# Patient Record
Sex: Female | Born: 1955
Health system: Southern US, Community
[De-identification: ages and names within clinical notes are randomized; demographics above are authoritative.]

## PROBLEM LIST (undated history)

## (undated) DIAGNOSIS — K219 Gastro-esophageal reflux disease without esophagitis: Secondary | ICD-10-CM

## (undated) DIAGNOSIS — C801 Malignant (primary) neoplasm, unspecified: Secondary | ICD-10-CM

## (undated) DIAGNOSIS — Z973 Presence of spectacles and contact lenses: Secondary | ICD-10-CM

## (undated) DIAGNOSIS — M549 Dorsalgia, unspecified: Secondary | ICD-10-CM

## (undated) DIAGNOSIS — G4730 Sleep apnea, unspecified: Secondary | ICD-10-CM

## (undated) DIAGNOSIS — G8929 Other chronic pain: Secondary | ICD-10-CM

## (undated) DIAGNOSIS — M1990 Unspecified osteoarthritis, unspecified site: Secondary | ICD-10-CM

## (undated) HISTORY — PX: ANKLE SURGERY: SHX546

## (undated) HISTORY — PX: WRIST GANGLION EXCISION: SUR520

## (undated) HISTORY — DX: Unspecified osteoarthritis, unspecified site: M19.90

## (undated) HISTORY — PX: MOHS SURGERY: SUR867

---
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
## 1997-12-24 HISTORY — PX: TUBAL LIGATION: SHX77

## 1998-09-22 ENCOUNTER — Inpatient Hospital Stay (HOSPITAL_COMMUNITY): Admission: AD | Admit: 1998-09-22 | Discharge: 1998-09-26 | Payer: Self-pay | Admitting: Obstetrics and Gynecology

## 2000-02-13 ENCOUNTER — Other Ambulatory Visit: Admission: RE | Admit: 2000-02-13 | Discharge: 2000-02-13 | Payer: Self-pay | Admitting: Obstetrics and Gynecology

## 2001-09-08 ENCOUNTER — Other Ambulatory Visit: Admission: RE | Admit: 2001-09-08 | Discharge: 2001-09-08 | Payer: Self-pay | Admitting: Obstetrics and Gynecology

## 2002-12-08 ENCOUNTER — Other Ambulatory Visit: Admission: RE | Admit: 2002-12-08 | Discharge: 2002-12-08 | Payer: Self-pay | Admitting: Obstetrics and Gynecology

## 2004-10-23 ENCOUNTER — Other Ambulatory Visit: Admission: RE | Admit: 2004-10-23 | Discharge: 2004-10-23 | Payer: Self-pay | Admitting: Obstetrics and Gynecology

## 2009-07-19 ENCOUNTER — Ambulatory Visit: Payer: Self-pay | Admitting: Family Medicine

## 2009-07-19 DIAGNOSIS — M1990 Unspecified osteoarthritis, unspecified site: Secondary | ICD-10-CM | POA: Insufficient documentation

## 2009-07-21 LAB — CONVERTED CEMR LAB
ALT: 19 units/L (ref 0–35)
AST: 21 units/L (ref 0–37)
Albumin: 4.1 g/dL (ref 3.5–5.2)
Alkaline Phosphatase: 71 units/L (ref 39–117)
BUN: 14 mg/dL (ref 6–23)
Basophils Absolute: 0 10*3/uL (ref 0.0–0.1)
Basophils Relative: 0.4 % (ref 0.0–3.0)
Bilirubin, Direct: 0 mg/dL (ref 0.0–0.3)
CO2: 31 meq/L (ref 19–32)
Calcium: 9.3 mg/dL (ref 8.4–10.5)
Chloride: 105 meq/L (ref 96–112)
Cholesterol: 193 mg/dL (ref 0–200)
Creatinine, Ser: 0.6 mg/dL (ref 0.4–1.2)
Eosinophils Absolute: 0.1 10*3/uL (ref 0.0–0.7)
Eosinophils Relative: 1.8 % (ref 0.0–5.0)
GFR calc non Af Amer: 111.18 mL/min (ref >60)
Glucose, Bld: 84 mg/dL (ref 70–99)
HCT: 39.3 % (ref 36.0–46.0)
HDL: 50.1 mg/dL (ref >39.00)
Hemoglobin: 13.5 g/dL (ref 12.0–15.0)
LDL Cholesterol: 120 mg/dL — ABNORMAL HIGH (ref 0–99)
Lymphocytes Relative: 21.6 % (ref 12.0–46.0)
Lymphs Abs: 1.6 10*3/uL (ref 0.7–4.0)
MCHC: 34.3 g/dL (ref 30.0–36.0)
MCV: 88.8 fL (ref 78.0–100.0)
Monocytes Absolute: 0.5 10*3/uL (ref 0.1–1.0)
Monocytes Relative: 6.9 % (ref 3.0–12.0)
Neutro Abs: 5.2 10*3/uL (ref 1.4–7.7)
Neutrophils Relative %: 69.3 % (ref 43.0–77.0)
Platelets: 275 10*3/uL (ref 150.0–400.0)
Potassium: 4.3 meq/L (ref 3.5–5.1)
RBC: 4.43 M/uL (ref 3.87–5.11)
RDW: 13.2 % (ref 11.5–14.6)
Sodium: 142 meq/L (ref 135–145)
TSH: 1.06 microintl units/mL (ref 0.35–5.50)
Total Bilirubin: 0.9 mg/dL (ref 0.3–1.2)
Total CHOL/HDL Ratio: 4
Total Protein: 7 g/dL (ref 6.0–8.3)
Triglycerides: 113 mg/dL (ref 0.0–149.0)
VLDL: 22.6 mg/dL (ref 0.0–40.0)
WBC: 7.4 10*3/uL (ref 4.5–10.5)

## 2009-08-19 ENCOUNTER — Ambulatory Visit: Payer: Self-pay | Admitting: Family Medicine

## 2009-08-19 ENCOUNTER — Other Ambulatory Visit: Admission: RE | Admit: 2009-08-19 | Discharge: 2009-08-19 | Payer: Self-pay | Admitting: Family Medicine

## 2009-08-19 ENCOUNTER — Encounter: Payer: Self-pay | Admitting: Family Medicine

## 2009-08-19 LAB — HM PAP SMEAR

## 2009-08-23 ENCOUNTER — Encounter (INDEPENDENT_AMBULATORY_CARE_PROVIDER_SITE_OTHER): Payer: Self-pay | Admitting: *Deleted

## 2009-09-09 ENCOUNTER — Encounter: Admission: RE | Admit: 2009-09-09 | Discharge: 2009-09-09 | Payer: Self-pay | Admitting: Family Medicine

## 2009-09-09 LAB — HM MAMMOGRAPHY: HM Mammogram: NEGATIVE

## 2009-09-13 ENCOUNTER — Encounter (INDEPENDENT_AMBULATORY_CARE_PROVIDER_SITE_OTHER): Payer: Self-pay | Admitting: *Deleted

## 2009-09-14 ENCOUNTER — Ambulatory Visit: Payer: Self-pay | Admitting: Gastroenterology

## 2009-09-30 ENCOUNTER — Ambulatory Visit: Payer: Self-pay | Admitting: Gastroenterology

## 2009-09-30 LAB — HM COLONOSCOPY

## 2010-02-06 ENCOUNTER — Ambulatory Visit: Payer: Self-pay | Admitting: Sports Medicine

## 2010-02-06 DIAGNOSIS — M722 Plantar fascial fibromatosis: Secondary | ICD-10-CM

## 2010-09-19 ENCOUNTER — Encounter
Admission: RE | Admit: 2010-09-19 | Discharge: 2010-10-03 | Payer: Self-pay | Source: Home / Self Care | Admitting: Podiatry

## 2010-10-09 ENCOUNTER — Emergency Department (HOSPITAL_COMMUNITY)
Admission: EM | Admit: 2010-10-09 | Discharge: 2010-10-09 | Payer: Self-pay | Source: Home / Self Care | Admitting: Family Medicine

## 2010-10-26 ENCOUNTER — Ambulatory Visit (HOSPITAL_COMMUNITY): Admission: RE | Admit: 2010-10-26 | Discharge: 2010-10-26 | Payer: Self-pay | Admitting: Orthopaedic Surgery

## 2011-01-23 NOTE — Assessment & Plan Note (Signed)
Summary: HEEL PAIN,TENNIS PLAYER,MC   Vital Signs:  Patient profile:   55 year old female BP sitting:   109 / 58  Vitals Entered By: Lillia Pauls CMA (February 06, 2010 3:55 PM)  History of Present Illness: 55 y/o female presents with bilateral heel pain Right > Left.  Started in November 2010 around the same time as Pt trying to do a run/walk regimen and also playing tennis.  In the past 3-4 weeks the pain has gotten dramatically worse.  Pain on both heels is in the middle, plantar surface of heel.  Denies swelling. Denies history of trauma.  Pt has pain with every step of walking, worse in the morning.  Better with cushion soled shoes like Crocks and also with platforms on the heel of a shoe.  Stairs are easier than walking flat.  Pt has changed her gait in order to take the pain off heel.  Has taken ibuprofen with some relief of pain.  Left heel is more of a nagging pain compared to right.  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   Allergies: 1)  ! * ? Pain Medication  Physical Exam  General:  alert, well-developed, and well-nourished.   Msk:  Bilateral Ankle/ Foot: -no signs of edema -Tender to palpation R>L in plantar fascia area on plantar foot.  Non-tender along arch of foot.  -FROM -5/5 strength  Gait: mild right foot medial collapse during stance phase    Impression & Recommendations:  Problem # 1:  FASCIITIS, PLANTAR (ICD-728.71)  Her updated medication list for this problem includes:    Aleve 220 Mg Caps (Naproxen sodium) .Marland Kitchen... As needed  Orders: Joint Aspirate / Injection, Intermediate (20605) we will try injedction and I have given her some temp orthotics w B scaphoid pads. rtc 3 weeks--will see how she is doing, may benefit from custom orthotics.  Complete Medication List: 1)  Aleve 220 Mg Caps (Naproxen sodium) .... As needed

## 2011-10-10 ENCOUNTER — Other Ambulatory Visit: Payer: Self-pay | Admitting: Family Medicine

## 2011-10-10 DIAGNOSIS — Z1231 Encounter for screening mammogram for malignant neoplasm of breast: Secondary | ICD-10-CM

## 2011-11-02 ENCOUNTER — Ambulatory Visit: Payer: Self-pay

## 2011-11-02 ENCOUNTER — Ambulatory Visit
Admission: RE | Admit: 2011-11-02 | Discharge: 2011-11-02 | Disposition: A | Discharge status: Home / Self Care | Payer: 59 | Source: Ambulatory Visit | Attending: Family Medicine | Admitting: Family Medicine

## 2011-11-02 DIAGNOSIS — Z1231 Encounter for screening mammogram for malignant neoplasm of breast: Secondary | ICD-10-CM

## 2011-11-09 ENCOUNTER — Encounter: Payer: Self-pay | Admitting: *Deleted

## 2011-12-27 ENCOUNTER — Telehealth: Payer: Self-pay | Admitting: Internal Medicine

## 2011-12-27 NOTE — Telephone Encounter (Signed)
Patient has dry cough, severely sore throat, aches and pain and headache.  No fever.  Made an appointment for tomorrow.

## 2011-12-28 ENCOUNTER — Ambulatory Visit (INDEPENDENT_AMBULATORY_CARE_PROVIDER_SITE_OTHER): Payer: 59 | Admitting: Family Medicine

## 2011-12-28 ENCOUNTER — Encounter: Payer: Self-pay | Admitting: Family Medicine

## 2011-12-28 VITALS — BP 100/64 | HR 84 | Temp 99.2°F | Ht 63.5 in | Wt 152.2 lb

## 2011-12-28 DIAGNOSIS — J029 Acute pharyngitis, unspecified: Secondary | ICD-10-CM

## 2011-12-28 DIAGNOSIS — J020 Streptococcal pharyngitis: Secondary | ICD-10-CM

## 2011-12-28 DIAGNOSIS — J069 Acute upper respiratory infection, unspecified: Secondary | ICD-10-CM

## 2011-12-28 DIAGNOSIS — Z8709 Personal history of other diseases of the respiratory system: Secondary | ICD-10-CM | POA: Insufficient documentation

## 2011-12-28 MED ORDER — AMOXICILLIN 500 MG PO CAPS: 500.0000 mg | ORAL_CAPSULE | Freq: Three times a day (TID) | ORAL | Status: AC

## 2011-12-28 NOTE — Patient Instructions (Signed)
I think you have strep and possibly a viral uri  Drink lots of fluids Tylenol for fever and throat pain / chloraseptic spray helps too  Rest  Out of work at least 24 hours after first abx dose  mucinex DM may help with cough  Update me if not starting to improve next week

## 2011-12-28 NOTE — Assessment & Plan Note (Signed)
With ST , fever and headache and pos rapid strep tx with amox and update  Disc symptomatic care - see instructions on AVS

## 2011-12-28 NOTE — Progress Notes (Signed)
  Subjective:    Patient ID: Bridget [name], female    DOB: [birth year], 56 y.o.   MRN: [id number]  HPI Started with uri symptoms - Monday night - cough and sore throat Now is productive Green mucous  Some hoarseness since Tuesday  No nasal congestion - just a bit of post nasal drip  Is achey- low grade fever  Head is hurting   Some otc tylenol yesterday  chlorcedin hpb at beginning of the week   Had her flu shot --oct   Strep test today is positive    Patient Active Problem List  Diagnoses  . DEGENERATIVE JOINT DISEASE, MILD  . FASCIITIS, PLANTAR  . Strep throat  . Viral URI   Past Medical History  Diagnosis Date  . 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 Ovarian cyst surgery 1973  . Cesarean section 1984 and 1986    hodp pre term labor CS  . Tubal ligation 1999   History  Substance Use Topics  . Smoking status: Never Smoker   . Smokeless tobacco: Not on file  . Alcohol Use: Yes     2-3 nights per week   Family History  Problem Relation Age of Onset  . Arthritis Mother     OA with verterabral fx  . Hypertension Mother   . Cancer Maternal Grandfather   . Diabetes Paternal Grandmother    No Known Allergies No current outpatient prescriptions on file prior to visit.      Review of Systems Review of Systems  Constitutional: Negative for fever, appetite change, fatigue and unexpected weight change.  Eyes: Negative for pain and visual disturbance.  ENT pos for sore throat , neg for ear pain or sinus tenderness Respiratory: Negative for sob or wheeze .   Cardiovascular: Negative for cp or palpitations    Gastrointestinal: Negative for nausea, diarrhea and constipation.  Genitourinary: Negative for urgency and frequency.  Skin: Negative for pallor or rash   Neurological: Negative for weakness, light-headedness, numbness and headaches.  Hematological: Negative for adenopathy. Does not bruise/bleed easily.    Psychiatric/Behavioral: Negative for dysphoric mood. The patient is not nervous/anxious.          Objective:   Physical Exam  Constitutional: She appears well-developed and well-nourished. No distress.  HENT:  Head: Normocephalic and atraumatic.  Right Ear: External ear normal.  Left Ear: External ear normal.  Nose: Nose normal.  Mouth/Throat: No oropharyngeal exudate.       Nares boggy Mild post throat injection/ no exudate or swelling or lesions   Eyes: Conjunctivae and EOM are normal. Pupils are equal, round, and reactive to light. Right eye exhibits no discharge. Left eye exhibits no discharge.  Neck: Normal range of motion. Neck supple. No thyromegaly present.  Cardiovascular: Normal rate, regular rhythm and normal heart sounds.   Pulmonary/Chest: Effort normal and breath sounds normal. No respiratory distress. She has no wheezes.  Lymphadenopathy:    She has no cervical adenopathy.  Skin: Skin is warm and dry. No rash noted. No erythema. No pallor.  Psychiatric: She has a normal mood and affect.          Assessment & Plan:

## 2011-12-28 NOTE — Assessment & Plan Note (Addendum)
In light of cough /resp symptoms suspect viral uri on top of the strep throat (caught from husband) Disc symptomatic care - see instructions on AVS  Update if not starting to improve in a week or if worsening

## 2012-01-09 ENCOUNTER — Telehealth: Payer: Self-pay | Admitting: Family Medicine

## 2012-01-09 DIAGNOSIS — Z0000 Encounter for general adult medical examination without abnormal findings: Secondary | ICD-10-CM | POA: Insufficient documentation

## 2012-01-09 NOTE — Telephone Encounter (Signed)
Message copied by Judy Pimple on Wed Jan 09, 2012  5:39 PM ------      Message from: Baldomero Lamy      Created: Wed Jan 02, 2012  8:24 AM      Regarding: cpx labs Thurs1/17/13       Please order  future cpx labs for pt's upcomming lab appt.      Thanks      Rodney Booze

## 2012-01-10 ENCOUNTER — Other Ambulatory Visit (INDEPENDENT_AMBULATORY_CARE_PROVIDER_SITE_OTHER): Payer: 59

## 2012-01-10 DIAGNOSIS — Z0000 Encounter for general adult medical examination without abnormal findings: Secondary | ICD-10-CM

## 2012-01-10 LAB — CBC WITH DIFFERENTIAL/PLATELET
Basophils Absolute: 0 10*3/uL (ref 0.0–0.1)
Basophils Relative: 0.6 % (ref 0.0–3.0)
Eosinophils Absolute: 0.2 10*3/uL (ref 0.0–0.7)
Eosinophils Relative: 3.1 % (ref 0.0–5.0)
HCT: 38.6 % (ref 36.0–46.0)
Hemoglobin: 13.1 g/dL (ref 12.0–15.0)
Lymphocytes Relative: 24.3 % (ref 12.0–46.0)
Lymphs Abs: 1.4 10*3/uL (ref 0.7–4.0)
MCHC: 33.8 g/dL (ref 30.0–36.0)
MCV: 88.8 fl (ref 78.0–100.0)
Monocytes Absolute: 0.4 10*3/uL (ref 0.1–1.0)
Monocytes Relative: 7 % (ref 3.0–12.0)
Neutro Abs: 3.7 10*3/uL (ref 1.4–7.7)
Neutrophils Relative %: 65 % (ref 43.0–77.0)
Platelets: 295 10*3/uL (ref 150.0–400.0)
RBC: 4.35 Mil/uL (ref 3.87–5.11)
RDW: 14.1 % (ref 11.5–14.6)
WBC: 5.7 10*3/uL (ref 4.5–10.5)

## 2012-01-10 LAB — COMPREHENSIVE METABOLIC PANEL
AST: 22 U/L (ref 0–37)
Alkaline Phosphatase: 65 U/L (ref 39–117)
BUN: 18 mg/dL (ref 6–23)
Creatinine, Ser: 0.6 mg/dL (ref 0.4–1.2)
Total Bilirubin: 0.4 mg/dL (ref 0.3–1.2)

## 2012-01-10 LAB — TSH: TSH: 0.86 u[IU]/mL (ref 0.35–5.50)

## 2012-01-10 LAB — LIPID PANEL
HDL: 49.5 mg/dL (ref >39.00)
LDL Cholesterol: 127 mg/dL — ABNORMAL HIGH (ref 0–99)
Total CHOL/HDL Ratio: 4
Triglycerides: 73 mg/dL (ref 0.0–149.0)

## 2012-01-18 ENCOUNTER — Ambulatory Visit (INDEPENDENT_AMBULATORY_CARE_PROVIDER_SITE_OTHER): Payer: 59 | Admitting: Family Medicine

## 2012-01-18 ENCOUNTER — Other Ambulatory Visit (HOSPITAL_COMMUNITY)
Admission: RE | Admit: 2012-01-18 | Discharge: 2012-01-18 | Disposition: A | Discharge status: Home / Self Care | Payer: 59 | Source: Ambulatory Visit | Attending: Family Medicine | Admitting: Family Medicine

## 2012-01-18 ENCOUNTER — Encounter: Payer: Self-pay | Admitting: Family Medicine

## 2012-01-18 VITALS — BP 100/70 | HR 80 | Temp 98.2°F | Ht 63.0 in | Wt 153.5 lb

## 2012-01-18 DIAGNOSIS — J069 Acute upper respiratory infection, unspecified: Secondary | ICD-10-CM

## 2012-01-18 DIAGNOSIS — Z0000 Encounter for general adult medical examination without abnormal findings: Secondary | ICD-10-CM

## 2012-01-18 DIAGNOSIS — Z1159 Encounter for screening for other viral diseases: Secondary | ICD-10-CM | POA: Insufficient documentation

## 2012-01-18 DIAGNOSIS — Z01419 Encounter for gynecological examination (general) (routine) without abnormal findings: Secondary | ICD-10-CM | POA: Insufficient documentation

## 2012-01-18 NOTE — Patient Instructions (Addendum)
Avoid red meat/ fried foods/ egg yolks/ fatty breakfast meats/ butter, cheese and high fat dairy/ and shellfish   Cholesterol is borderline  Try to get some form of low impact exercise when you can

## 2012-01-18 NOTE — Progress Notes (Signed)
  Subjective:    Patient ID: Bridget [name], female    DOB: 03-20-1956, 56 y.o.   MRN: [id number]  HPI Here for health maintenance exam and to review chronic medical problems   Is doing ok overall   Was in for strep throat 3 wk ago - treated Now still coughing some with some tightness   Did get flu shot in oct   bp is 100/70 good  Wt is stable  Labs ok   Chol fair Lab Results  Component Value Date   CHOL 191 01/10/2012   CHOL 193 07/19/2009   Lab Results  Component Value Date   HDL 49.50 01/10/2012   HDL 40.98 07/19/2009   Lab Results  Component Value Date   LDLCALC 127* 01/10/2012   LDLCALC 120* 07/19/2009   Lab Results  Component Value Date   TRIG 73.0 01/10/2012   TRIG 113.0 07/19/2009   Lab Results  Component Value Date   CHOLHDL 4 01/10/2012   CHOLHDL 4 07/19/2009   No results found for this basename: LDLDIRECT   diet-- does not eat that much in general red meat occas/ fried foods occ/ no biscuits / occ cheese  Exercise- not a lot of that  No time for that   Foot surgery - not very helpful    Flu shot- was in oct   Pap 8/10- has not had one since then  No hx of abn paps No new sexual partner  mammo 11/12- was normal  Self exam- no lumps or change   colonosc 10/10- 10 year follow up  GF had colon cancer    Review of Systems     Objective:   Physical Exam  Constitutional: She appears well-developed and well-nourished. No distress.  HENT:  Head: Normocephalic and atraumatic.  Right Ear: External ear normal.  Left Ear: External ear normal.  Nose: Nose normal.  Mouth/Throat: Oropharynx is clear and moist. No oropharyngeal exudate.  Eyes: Conjunctivae and EOM are normal. Pupils are equal, round, and reactive to light. No scleral icterus.  Neck: Normal range of motion. Neck supple. No JVD present. Carotid bruit is not present. No thyromegaly present.  Cardiovascular: Normal rate, regular rhythm, normal heart sounds and intact distal pulses.  Exam  reveals no gallop.   Pulmonary/Chest: Effort normal and breath sounds normal. No respiratory distress. She has no wheezes.  Abdominal: Soft. Bowel sounds are normal. She exhibits no distension and no mass. There is no tenderness.  Genitourinary: Rectum normal, vagina normal and uterus normal. No breast swelling, tenderness, discharge or bleeding. Uterus is not enlarged and not tender. Cervix exhibits no motion tenderness, no discharge and no friability. Right adnexum displays no mass and no tenderness. Left adnexum displays no mass and no tenderness. No vaginal discharge found.       Breast exam: No mass, nodules, thickening, tenderness, bulging, retraction, inflamation, nipple discharge or skin changes noted.  No axillary or clavicular LA.  Chaperoned exam.    Musculoskeletal: Normal range of motion. She exhibits no edema and no tenderness.  Lymphadenopathy:    She has no cervical adenopathy.  Neurological: She is alert. She has normal reflexes. No cranial nerve deficit. She exhibits normal muscle tone. Coordination normal.  Skin: Skin is warm and dry. No rash noted. No erythema. No pallor.       Some lentigos   Psychiatric: She has a normal mood and affect.          Assessment & Plan:

## 2012-01-18 NOTE — Assessment & Plan Note (Signed)
Exam done with pap  If normal - will plan on this every 3 years (also if no symptoms) Unremarkable exam

## 2012-01-18 NOTE — Assessment & Plan Note (Signed)
Reviewed health habits including diet and exercise and skin cancer prevention Also reviewed health mt list, fam hx and immunizations  Rev wellness labs Will work on low chol diet

## 2012-01-18 NOTE — Assessment & Plan Note (Signed)
Still coughing a bit but reasssuring exam- will continue to follow

## 2012-01-24 ENCOUNTER — Encounter: Payer: Self-pay | Admitting: *Deleted

## 2012-02-27 ENCOUNTER — Ambulatory Visit (INDEPENDENT_AMBULATORY_CARE_PROVIDER_SITE_OTHER): Payer: 59 | Admitting: Family Medicine

## 2012-02-27 ENCOUNTER — Encounter: Payer: Self-pay | Admitting: Family Medicine

## 2012-02-27 VITALS — BP 104/68 | HR 80 | Temp 98.2°F | Wt 154.0 lb

## 2012-02-27 DIAGNOSIS — J029 Acute pharyngitis, unspecified: Secondary | ICD-10-CM

## 2012-02-27 MED ORDER — HYDROCOD POLST-CHLORPHEN POLST 10-8 MG/5ML PO LQCR: 5.0000 mL | Freq: Every evening | ORAL | Status: DC | PRN

## 2012-02-27 MED ORDER — PENICILLIN V POTASSIUM 500 MG PO TABS: 500.0000 mg | ORAL_TABLET | Freq: Three times a day (TID) | ORAL | Status: AC

## 2012-02-27 NOTE — Progress Notes (Signed)
SUBJECTIVE: 56 y.o. female with sore throat, myalgias, swollen glands, headache and fever for 3 days. No history of rheumatic fever.   Had similar symptoms in January and tested positive for Strep throat.  Patient Active Problem List  Diagnoses  . DEGENERATIVE JOINT DISEASE, MILD  . FASCIITIS, PLANTAR  . Viral URI  . Routine general medical examination at a health care facility  . Gynecological examination   Past Medical History  Diagnosis Date  . 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 Ovarian cyst surgery 1973  . Cesarean section 1984 and 1986    hodp pre term labor CS  . Tubal ligation 1999   History  Substance Use Topics  . Smoking status: Never Smoker   . Smokeless tobacco: Not on file  . Alcohol Use: Yes     2-3 nights per week   Family History  Problem Relation Age of Onset  . Arthritis Mother     OA with verterabral fx  . Hypertension Mother   . Fibrocystic breast disease Mother   . Cancer Maternal Grandfather     colon CA  . Diabetes Paternal Grandmother    Allergies  Allergen Reactions  . Lorcet (Hydrocodone-Acetaminophen) Rash   Current Outpatient Prescriptions on File Prior to Visit  Medication Sig Dispense Refill  . acetaminophen (TYLENOL) 500 MG tablet Take 500 mg by mouth every 6 (six) hours as needed.        . Naproxen Sodium (ALEVE PO) Take by mouth as needed.         The PMH, PSH, Social History, Family History, Medications, and allergies have been reviewed in [facility], and have been updated if relevant.  OBJECTIVE:  BP 104/68  Pulse 80  Temp(Src) 98.2 F (36.8 C) (Oral)  Wt 154 lb (69.854 kg)  Vitals as noted above. Appears alert, well appearing, and in no distress. Ears: bilateral TM's and external ear canals normal Oropharynx: tonsils hypertrophied with exudate Neck: supple, no significant adenopathy Lungs: clear to auscultation, no wheezes, rales or rhonchi, symmetric air entry Rapid Strep test is  present  ASSESSMENT: Streptococcal pharyngitis- although rapid strep neg, similar symptoms to last strep infection.  PLAN: PCN 500 mg three times daily x 10 days and tussionex as directed (verified that she has taken this and is NOT allergic). Per orders. Gargle, use acetaminophen or other OTC analgesic, and take Rx fully as prescribed. Call if other family members develop similar symptoms. See prn.

## 2012-02-29 ENCOUNTER — Telehealth: Payer: Self-pay | Admitting: Family Medicine

## 2012-02-29 NOTE — Telephone Encounter (Signed)
Triage Record Num: [id number] Operator: Baldomero Lamy Patient Name: Bridget [name] Call Date & Time: 02/29/2012 11:52:22AM Patient Phone: [phone] PCP: Patient Gender: Female PCP Fax : Patient DOB: 03-10-1956 Practice Name: [facility] Reason for Call: Caller: [name]/Patient; PCP: Roxy Manns A.; CB#: [phone]; ; ; Call regarding Inquiring About A. Need for A. New Antibx.; Pt callling as a follow up to visit on 3/6. Pt negative for strep but started on PCN for same type of sxs. Pt now 48 hrs into antibx and no better with temp still 100.5, and wants to know if MD would rather "try a broader spectrum antibiotic". Pt has not tx fever. Triager explained that sometimes it takes 2-3 days to see an true imrovement when on antibiotics and suggested take antipyretic of choice per pkg directions. Pt asked that note still be sent. Protocol(s) Used: Information Only Call; No Symptom Triage (Adult) Recommended Outcome per Protocol: Provide Information or Advice Only Reason for Outcome: Follow-up call to recent contact; no triage required. Information provided from past call documentation, approved references or experience. Care Advice: ~ 02/29/2012 11:57:54AM Page 1 of 1 [email or web address]

## 2012-03-02 NOTE — Telephone Encounter (Signed)
I did not get this until now - please call and see how she is feeling now

## 2012-03-03 NOTE — Telephone Encounter (Signed)
Pt said she is feeling better. Pt returned to work today. Pt said no fever since Saturday. Mild productive cough with clear phlegm usually. Pt still taking antibiotic. Pt said when finishes antibiotic if not clear she will call back and appreciated the call.

## 2013-05-01 ENCOUNTER — Encounter: Payer: Self-pay | Admitting: Family Medicine

## 2013-05-01 ENCOUNTER — Ambulatory Visit (INDEPENDENT_AMBULATORY_CARE_PROVIDER_SITE_OTHER): Payer: 59 | Admitting: Family Medicine

## 2013-05-01 VITALS — BP 108/72 | HR 87 | Temp 98.8°F | Ht 63.0 in | Wt 153.8 lb

## 2013-05-01 DIAGNOSIS — L089 Local infection of the skin and subcutaneous tissue, unspecified: Secondary | ICD-10-CM

## 2013-05-01 MED ORDER — MOMETASONE FUROATE 0.1 % EX CREA: TOPICAL_CREAM | Freq: Every day | CUTANEOUS | Status: DC

## 2013-05-01 MED ORDER — SULFAMETHOXAZOLE-TRIMETHOPRIM 800-160 MG PO TABS: 1.0000 | ORAL_TABLET | Freq: Two times a day (BID) | ORAL | Status: DC

## 2013-05-01 NOTE — Progress Notes (Signed)
  Subjective:    Patient ID: Bridget [name], female    DOB: 04-20-1956, 57 y.o.   MRN: [id number]  HPI Here with an insect bite on her calf-noticed wednesday Had been outdoors during the day - Saturday and through week   Worsened yesterday  Both itches and hurts  Does not remember being bitten or stung and no ticks were seen or removed No fever  Used otc hydrocortisone and took oral benadryl  Tried benadryl cream -no help  Then bite started to ooze  Now the ankle is swollen   Husband has had mrsa in the past   Td 07  Patient Active Problem List   Diagnosis Date Noted  . Gynecological examination 01/18/2012  . Routine general medical examination at a health care facility 01/09/2012  . Viral URI 12/28/2011  . FASCIITIS, PLANTAR 02/06/2010  . DEGENERATIVE JOINT DISEASE, MILD 07/19/2009   Past Medical History  Diagnosis Date  . 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 Ovarian cyst surgery  1973  . Cesarean section  1984 and 1986    hodp pre term labor CS  . Tubal ligation  1999   History  Substance Use Topics  . Smoking status: Never Smoker   . Smokeless tobacco: Not on file  . Alcohol Use: Yes     Comment: 2-3 nights per week   Family History  Problem Relation Age of Onset  . Arthritis Mother     OA with verterabral fx  . Hypertension Mother   . Fibrocystic breast disease Mother   . Cancer Maternal Grandfather     colon CA  . Diabetes Paternal Grandmother    Allergies  Allergen Reactions  . Lorcet (Hydrocodone-Acetaminophen) Rash   Current Outpatient Prescriptions on File Prior to Visit  Medication Sig Dispense Refill  . acetaminophen (TYLENOL) 500 MG tablet Take 500 mg by mouth every 6 (six) hours as needed.         No current facility-administered medications on file prior to visit.     Review of Systems Review of Systems  Constitutional: Negative for fever, appetite change, fatigue and unexpected  weight change.  Eyes: Negative for pain and visual disturbance.  Respiratory: Negative for cough and shortness of breath.   Cardiovascular: Negative for cp or palpitations    Gastrointestinal: Negative for nausea, diarrhea and constipation.  Genitourinary: Negative for urgency and frequency.  Skin: Negative for pallor or rash, pos for redness/ swelling and intense itching of an insect bite MSK neg for joint pain or swelling  Neurological: Negative for weakness, light-headedness, numbness and headaches.  Hematological: Negative for adenopathy. Does not bruise/bleed easily.  Psychiatric/Behavioral: Negative for dysphoric mood. The patient is not nervous/anxious.         Objective:   Physical Exam  Constitutional: She appears well-developed and well-nourished.  HENT:  Head: Normocephalic and atraumatic.  Neck: Normal range of motion. Neck supple.  Cardiovascular: Normal rate and regular rhythm.   Lymphadenopathy:    She has no cervical adenopathy.  Skin: Skin is warm and dry. No rash noted. There is erythema. No pallor.  L lat calf- 1-2 cm scab leaking straw colored fluid (small amount) Erythema surrounds it extending 2-4 cm towards ankle Mild swelling of ankle  Non tender No excoriations  Psychiatric: She has a normal mood and affect.          Assessment & Plan:

## 2013-05-01 NOTE — Assessment & Plan Note (Signed)
L calf area - bite with scab/ scant clear straw colored drainage and erythema 3-4 cm under it (ankle is slt swollen) Suspect comb of all and infx rxn Cover with septra for poss mrsa cx taken  Elocon cream for area below bite Update if not starting to improve in a week or if worsening

## 2013-05-01 NOTE — Patient Instructions (Addendum)
Elevate leg Cool compresses will help itch  Wash with antibacterial soap and water  We will update you when culture returns  Take bactrim ds as directed Elocon cream on surrounding skin and use the bactroban on scab  If worse - swelling or redness or fever -- call !

## 2013-05-05 LAB — WOUND CULTURE: Organism ID, Bacteria: NO GROWTH

## 2013-05-06 ENCOUNTER — Encounter: Payer: Self-pay | Admitting: Family Medicine

## 2013-08-14 ENCOUNTER — Other Ambulatory Visit: Payer: Self-pay

## 2013-08-14 DIAGNOSIS — Z1231 Encounter for screening mammogram for malignant neoplasm of breast: Secondary | ICD-10-CM

## 2013-09-07 ENCOUNTER — Ambulatory Visit: Payer: 59

## 2013-09-20 ENCOUNTER — Telehealth: Payer: Self-pay | Admitting: Family Medicine

## 2013-09-20 DIAGNOSIS — Z0000 Encounter for general adult medical examination without abnormal findings: Secondary | ICD-10-CM

## 2013-09-20 NOTE — Telephone Encounter (Signed)
Message copied by Judy Pimple on Sun Sep 20, 2013  6:32 PM ------      Message from: Baldomero Lamy      Created: Tue Sep 15, 2013  1:55 PM      Regarding: Cpx labs Mon 9/29       Please order  future cpx labs for pt's upcoming lab appt.      Thanks      Tasha       ------

## 2013-09-21 ENCOUNTER — Other Ambulatory Visit (INDEPENDENT_AMBULATORY_CARE_PROVIDER_SITE_OTHER): Payer: 59

## 2013-09-21 DIAGNOSIS — Z0000 Encounter for general adult medical examination without abnormal findings: Secondary | ICD-10-CM

## 2013-09-21 LAB — CBC WITH DIFFERENTIAL/PLATELET
Eosinophils Relative: 3.8 % (ref 0.0–5.0)
HCT: 38.7 % (ref 36.0–46.0)
Hemoglobin: 13.1 g/dL (ref 12.0–15.0)
Lymphocytes Relative: 23.3 % (ref 12.0–46.0)
Lymphs Abs: 1.3 10*3/uL (ref 0.7–4.0)
Monocytes Relative: 6.9 % (ref 3.0–12.0)
Platelets: 266 10*3/uL (ref 150.0–400.0)
WBC: 5.6 10*3/uL (ref 4.5–10.5)

## 2013-09-21 LAB — COMPREHENSIVE METABOLIC PANEL
ALT: 15 U/L (ref 0–35)
AST: 19 U/L (ref 0–37)
Alkaline Phosphatase: 61 U/L (ref 39–117)
BUN: 20 mg/dL (ref 6–23)
Creatinine, Ser: 0.6 mg/dL (ref 0.4–1.2)
Sodium: 140 mEq/L (ref 135–145)

## 2013-09-21 LAB — LIPID PANEL
HDL: 50.7 mg/dL (ref >39.00)
LDL Cholesterol: 116 mg/dL — ABNORMAL HIGH (ref 0–99)
Total CHOL/HDL Ratio: 4
Triglycerides: 55 mg/dL (ref 0.0–149.0)

## 2013-09-22 ENCOUNTER — Other Ambulatory Visit: Payer: 59

## 2013-09-24 ENCOUNTER — Ambulatory Visit
Admission: RE | Admit: 2013-09-24 | Discharge: 2013-09-24 | Disposition: A | Discharge status: Home / Self Care | Payer: 59 | Source: Ambulatory Visit

## 2013-09-24 ENCOUNTER — Encounter: Payer: Self-pay | Admitting: Family Medicine

## 2013-09-24 DIAGNOSIS — Z1231 Encounter for screening mammogram for malignant neoplasm of breast: Secondary | ICD-10-CM

## 2013-09-30 ENCOUNTER — Encounter: Payer: Self-pay | Admitting: Family Medicine

## 2013-09-30 ENCOUNTER — Ambulatory Visit (INDEPENDENT_AMBULATORY_CARE_PROVIDER_SITE_OTHER): Payer: 59 | Admitting: Family Medicine

## 2013-09-30 ENCOUNTER — Telehealth: Payer: Self-pay | Admitting: Family Medicine

## 2013-09-30 VITALS — BP 104/68 | HR 86 | Temp 98.6°F | Ht 62.75 in | Wt 155.2 lb

## 2013-09-30 DIAGNOSIS — S20219A Contusion of unspecified front wall of thorax, initial encounter: Secondary | ICD-10-CM

## 2013-09-30 DIAGNOSIS — M8580 Other specified disorders of bone density and structure, unspecified site: Secondary | ICD-10-CM | POA: Insufficient documentation

## 2013-09-30 DIAGNOSIS — M899 Disorder of bone, unspecified: Secondary | ICD-10-CM

## 2013-09-30 DIAGNOSIS — Z0000 Encounter for general adult medical examination without abnormal findings: Secondary | ICD-10-CM

## 2013-09-30 DIAGNOSIS — S20212A Contusion of left front wall of thorax, initial encounter: Secondary | ICD-10-CM | POA: Insufficient documentation

## 2013-09-30 NOTE — Progress Notes (Signed)
Subjective:    Patient ID: ABBE BULA, female    DOB: 1956/10/06, 57 y.o.   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 like that  Eats lots of fiber and drinks lots of water   Wt is up 2 lb with bmi of 27  Flu vaccine - yesterday at [facility] Td 8/07 Going on a mission trip to [place] - in Jan  Will need a px for cipro  [facility]    Pap 1/13 normal No gyn problems  Last abn pap was 25 y ago  No new sexual partners   Mammogram 10/14 neg Self exam - no lumps   colonosc 10/10 nl with 10 y recall   dexa 9/10 Mild osteopenia  T -1.3 in LS  She has lost height  She does not take her ca and D   fam hx" father with AAA  Mood-pretty good / no depression   Patient Active Problem List   Diagnosis Date Noted  . Insect bite of leg, infected 05/01/2013  . Gynecological examination 01/18/2012  . Routine general medical examination at a health care facility 01/09/2012  . FASCIITIS, PLANTAR 02/06/2010  . DEGENERATIVE JOINT DISEASE, MILD 07/19/2009   Past Medical History  Diagnosis Date  . 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 Ovarian cyst surgery  1973  . Cesarean section  1984 and 1986    hodp pre term labor CS  . Tubal ligation  1999   History  Substance Use Topics  . Smoking status: Never Smoker   . Smokeless tobacco: Not on file  . Alcohol Use: Yes     Comment: 2-3 nights per week   Family History  Problem Relation Age of Onset  . Arthritis Mother     OA with verterabral fx  . Hypertension Mother    . Fibrocystic breast disease Mother   . Cancer Maternal Grandfather     colon CA  . Diabetes Paternal Grandmother    Allergies  Allergen Reactions  . Lorcet [Hydrocodone-Acetaminophen] Rash   No current outpatient prescriptions on file prior to visit.   No current facility-administered medications on file prior to visit.     Review of Systems Review of Systems  Constitutional: Negative for fever, appetite change, fatigue and unexpected weight change.  Eyes: Negative for pain and visual disturbance.  Respiratory: Negative for cough and shortness of breath.   Cardiovascular: Negative for cp or palpitations    Gastrointestinal: Negative for nausea, diarrhea and constipation.  Genitourinary: Negative for urgency and frequency.  Skin: Negative for pallor or rash   MSk pos for cw soreness from fall  Neurological: Negative for weakness, light-headedness, numbness and headaches.  Hematological: Negative for adenopathy. Does not bruise/bleed easily.  Psychiatric/Behavioral: Negative for dysphoric mood. The patient is not nervous/anxious.         Objective:   Physical Exam  Constitutional: She appears well-developed and well-nourished. No distress.  HENT:  Head:  Normocephalic and atraumatic.  Right Ear: External ear normal.  Left Ear: External ear normal.  Nose: Nose normal.  Mouth/Throat: Oropharynx is clear and moist.  Eyes: Conjunctivae and EOM are normal. Pupils are equal, round, and reactive to light. Right eye exhibits no discharge. Left eye exhibits no discharge. No scleral icterus.  Neck: Normal range of motion. Neck supple. No JVD present. Carotid bruit is not present. No thyromegaly present.  Cardiovascular: Normal rate, regular rhythm, normal heart sounds and intact distal pulses.  Exam reveals no gallop.   Pulmonary/Chest: Effort normal and breath sounds normal. No respiratory distress. She has no wheezes. She exhibits tenderness.  Mild L lateral cw tenderness without  skin change or crepitus  Abdominal: Soft. Bowel sounds are normal. She exhibits no distension, no abdominal bruit and no mass. There is no tenderness.  Genitourinary: No breast swelling, tenderness, discharge or bleeding.  Breast exam: No mass, nodules, thickening, tenderness, bulging, retraction, inflamation, nipple discharge or skin changes noted.  No axillary or clavicular LA.   Musculoskeletal: She exhibits no edema and no tenderness.  Lymphadenopathy:    She has no cervical adenopathy.  Neurological: She is alert. She has normal reflexes. No cranial nerve deficit. She exhibits normal muscle tone. Coordination normal.  Skin: Skin is warm and dry. No rash noted. No erythema. No pallor.  Psychiatric: She has a normal mood and affect.          Assessment & Plan:

## 2013-09-30 NOTE — Telephone Encounter (Signed)
The pt is hoping to get a referral and hoping to use her cell number

## 2013-09-30 NOTE — Patient Instructions (Signed)
Use heat on ribs to help pain  Take deep breaths to prevent pneumonia  Try to get 1200-1500 mg of calcium per day with at least 1000 iu of vitamin D - for bone health  Call your insurance co to see if an abd [place] would be covered for screening of AAA - due to family history and let me know   Keep up healthy diet and exercise

## 2013-10-01 NOTE — Assessment & Plan Note (Signed)
Reassuring exam Disc avoidance of atelectasis with deep breaths  Disc use of analgesics and warm compress Adv to avoid athletics for 2 wk or until feeling better  Will update if new symptoms like cough/ inc pain or sob

## 2013-10-01 NOTE — Assessment & Plan Note (Signed)
Reviewed health habits including diet and exercise and skin cancer prevention Also reviewed health mt list, fam hx and immunizations   Labs reviewed  

## 2013-10-01 NOTE — Assessment & Plan Note (Signed)
Schedule 2 y dexa Disc ca and D and exercise  No hx of fragility fx/ some ht decrease

## 2013-10-13 ENCOUNTER — Ambulatory Visit
Admission: RE | Admit: 2013-10-13 | Discharge: 2013-10-13 | Disposition: A | Discharge status: Home / Self Care | Payer: 59 | Source: Ambulatory Visit | Attending: Family Medicine | Admitting: Family Medicine

## 2013-10-13 DIAGNOSIS — M8580 Other specified disorders of bone density and structure, unspecified site: Secondary | ICD-10-CM

## 2013-10-13 LAB — HM DEXA SCAN

## 2013-10-14 ENCOUNTER — Encounter: Payer: Self-pay | Admitting: Family Medicine

## 2014-10-05 ENCOUNTER — Ambulatory Visit (INDEPENDENT_AMBULATORY_CARE_PROVIDER_SITE_OTHER): Payer: 59 | Admitting: Family Medicine

## 2014-10-05 ENCOUNTER — Encounter: Payer: Self-pay | Admitting: Family Medicine

## 2014-10-05 VITALS — BP 110/76 | HR 71 | Temp 98.0°F | Ht 62.75 in | Wt 153.5 lb

## 2014-10-05 DIAGNOSIS — M545 Low back pain, unspecified: Secondary | ICD-10-CM | POA: Insufficient documentation

## 2014-10-05 LAB — POCT URINALYSIS DIPSTICK
BILIRUBIN UA: NEGATIVE
Blood, UA: NEGATIVE
GLUCOSE UA: NEGATIVE
Ketones, UA: NEGATIVE
Leukocytes, UA: NEGATIVE
NITRITE UA: NEGATIVE
Protein, UA: NEGATIVE
Spec Grav, UA: 1.02
Urobilinogen, UA: 0.2
pH, UA: 6

## 2014-10-05 MED ORDER — MELOXICAM 15 MG PO TABS: 15.0000 mg | ORAL_TABLET | Freq: Every day | ORAL | Status: DC

## 2014-10-05 NOTE — Progress Notes (Signed)
Pre visit review using our clinic review tool, if applicable. No additional management support is needed unless otherwise documented below in the visit note. 

## 2014-10-05 NOTE — Patient Instructions (Signed)
Take meloxicam instead of ibuprofen once daily  Use heat -that relaxes muscles Walk as much as you can  Flexeril 5 mg - up to three times daily as tolerated Stop at check out for referral for PT

## 2014-10-05 NOTE — Assessment & Plan Note (Signed)
Right sided w/o significant sciatica  Likely from exercise (new) meloxicam 15  Flexeril 5 (she has at home)- as tolerated with sedation  Heat  Ref to PT eval and tx Update if worse or not imp

## 2014-10-05 NOTE — Progress Notes (Signed)
Subjective:    Patient ID: Bridget [name], female    DOB: August 14, 1956, 58 y.o.   MRN: [id number]  HPI Here for new low back pain  Has been exercising  A week ago - doing ab work Dietitian her legs straight while lying on her back- her back hurt a bit  Several days later the pain got worse Yesterday much worse Is on R to middle  Nothing makes it better - 800 ibuprofen , aleve, flexeril (husband's) at night, ice and heat  Bending forward and sitting are not good/ lying down a bit better/ also walking   Pain rad down R hip -not all the way down   No n/t or weakness   Patient Active Problem List   Diagnosis Date Noted  . Contusion of rib on left side 09/30/2013  . Osteopenia 09/30/2013  . Insect bite of leg, infected 05/01/2013  . Gynecological examination 01/18/2012  . Routine general medical examination at a health care facility 01/09/2012  . [place], [place] 02/06/2010  . DEGENERATIVE JOINT DISEASE, MILD 07/19/2009   Past Medical History  Diagnosis Date  . 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 Ovarian cyst surgery  1973  . Cesarean section  1984 and 1986    hodp pre term labor CS  . Tubal ligation  1999   History  Substance Use Topics  . Smoking status: Never Smoker   . Smokeless tobacco: Not on file  . Alcohol Use: Yes     Comment: 2-3 nights per week   Family History  Problem Relation Age of Onset  . Arthritis Mother     OA with verterabral fx  . Hypertension Mother   . Fibrocystic breast disease Mother   . Cancer Maternal Grandfather     colon CA  . Diabetes Paternal Grandmother    Allergies  Allergen Reactions  . Lorcet [Hydrocodone-Acetaminophen] Rash   Current Outpatient Prescriptions on File Prior to Visit  Medication Sig Dispense Refill  . ibuprofen (ADVIL,MOTRIN) 200 MG tablet Take 200 mg by mouth as needed for pain.       No current facility-administered medications on file prior to  visit.      Review of Systems Review of Systems  Constitutional: Negative for fever, appetite change, fatigue and unexpected weight change.  Eyes: Negative for pain and visual disturbance.  Respiratory: Negative for cough and shortness of breath.   Cardiovascular: Negative for cp or palpitations    Gastrointestinal: Negative for nausea, diarrhea and constipation.  Genitourinary: Negative for urgency and frequency.  Skin: Negative for pallor or rash   MSK pos for R low back pain  Neurological: Negative for weakness, light-headedness, numbness and headaches.  Hematological: Negative for adenopathy. Does not bruise/bleed easily.  Psychiatric/Behavioral: Negative for dysphoric mood. The patient is not nervous/anxious.         Objective:   Physical Exam  Constitutional: She appears well-developed and well-nourished. No distress.  overwt and well appearing   Eyes: Conjunctivae and EOM are normal. Pupils are equal, round, and reactive to light.  Neck: Normal range of motion. Neck supple.  Cardiovascular: Normal rate, regular rhythm and normal heart sounds.   Pulmonary/Chest: Effort normal and breath sounds normal. No respiratory distress. She has no wheezes. She has no rales.  Musculoskeletal: She exhibits tenderness. She exhibits no edema.       Lumbar back: She exhibits decreased range of motion, tenderness and  spasm. She exhibits no bony tenderness and no edema.  L lumbar spasm Neg SLR Nl rom hip  Pain to flex to 90 deg  Pain to flex R  Nl gait No neuro changes   Lymphadenopathy:    She has no cervical adenopathy.  Neurological: She is alert. She has normal strength and normal reflexes. She displays no atrophy. No cranial nerve deficit or sensory deficit. She exhibits normal muscle tone. Coordination and gait normal.  Skin: Skin is warm and dry. No rash noted. No erythema. No pallor.  Psychiatric: She has a normal mood and affect.          Assessment & Plan:   Problem  List Items Addressed This Visit     Other   Low back pain - Primary     Right sided w/o significant sciatica  Likely from exercise (new) meloxicam 15  Flexeril 5 (she has at home)- as tolerated with sedation  Heat  Ref to PT eval and tx Update if worse or not imp    Relevant Medications      meloxicam (MOBIC) tablet   Other Relevant Orders      Ambulatory referral to Physical Therapy      POCT urinalysis dipstick (Completed)    Other Visit Diagnoses   Low back pain without sciatica, unspecified back pain laterality        Relevant Medications       meloxicam (MOBIC) tablet    Other Relevant Orders       POCT urinalysis dipstick (Completed)

## 2014-10-06 ENCOUNTER — Encounter: Payer: Self-pay | Admitting: Family Medicine

## 2014-10-24 ENCOUNTER — Encounter: Payer: Self-pay | Admitting: Family Medicine

## 2014-11-23 ENCOUNTER — Encounter: Payer: Self-pay | Admitting: Family Medicine

## 2014-11-24 ENCOUNTER — Other Ambulatory Visit: Payer: Self-pay

## 2014-11-24 DIAGNOSIS — Z1231 Encounter for screening mammogram for malignant neoplasm of breast: Secondary | ICD-10-CM

## 2014-12-14 ENCOUNTER — Ambulatory Visit: Payer: 59

## 2014-12-14 ENCOUNTER — Ambulatory Visit
Admission: RE | Admit: 2014-12-14 | Discharge: 2014-12-14 | Disposition: A | Discharge status: Home / Self Care | Payer: 59 | Source: Ambulatory Visit

## 2014-12-14 DIAGNOSIS — Z1231 Encounter for screening mammogram for malignant neoplasm of breast: Secondary | ICD-10-CM

## 2015-05-14 ENCOUNTER — Ambulatory Visit
Admission: EM | Admit: 2015-05-14 | Discharge: 2015-05-14 | Disposition: A | Discharge status: Home / Self Care | Payer: 59 | Attending: Family Medicine | Admitting: Family Medicine

## 2015-05-14 DIAGNOSIS — J029 Acute pharyngitis, unspecified: Secondary | ICD-10-CM | POA: Insufficient documentation

## 2015-05-14 DIAGNOSIS — J069 Acute upper respiratory infection, unspecified: Secondary | ICD-10-CM | POA: Insufficient documentation

## 2015-05-14 HISTORY — DX: Other chronic pain: G89.29

## 2015-05-14 HISTORY — DX: Dorsalgia, unspecified: M54.9

## 2015-05-14 LAB — RAPID STREP SCREEN (MED CTR MEBANE ONLY): STREPTOCOCCUS, GROUP A SCREEN (DIRECT): NEGATIVE

## 2015-05-14 MED ORDER — AZITHROMYCIN 250 MG PO TABS: ORAL_TABLET | ORAL | Status: DC

## 2015-05-14 NOTE — ED Provider Notes (Signed)
SUBJECTIVE:  Bridget [name] is a 59 y.o. female who complains of severe sore throat, nasal drainage, cough for the last few days. Denies CP, SOB, N/V/D, abdominal pain, severe headache. Has tried OTC Medication without much relief.    OBJECTIVE: She appears well, vital signs are as noted.  General: NAD HEENT: mild pharyngeal erythema, no exudate, no erythema of TMs, no cervical LAD Respiratory: CTA B Cardiology: RRR Neurological: CN II-XII grossly intact   ASSESSMENT:  Pharyngitis, URI  PLAN: Rapid Strep negative. Z-pk if symptoms worsen as discussed, Claritin prn, Delysm prn, Tylenol/Motrin prn, rest hydration, seek medical attention if symptoms persist or worsen.        Paulina Fusi, MD 05/14/15 1335

## 2015-05-14 NOTE — ED Notes (Signed)
Started Wednesday night with sore throat. "My uvula is swollen". + nagging cough

## 2015-05-17 LAB — CULTURE, GROUP A STREP (THRC)

## 2015-05-20 ENCOUNTER — Ambulatory Visit (INDEPENDENT_AMBULATORY_CARE_PROVIDER_SITE_OTHER): Payer: 59 | Admitting: Family Medicine

## 2015-05-20 ENCOUNTER — Encounter: Payer: Self-pay | Admitting: Family Medicine

## 2015-05-20 VITALS — BP 106/68 | HR 85 | Temp 98.4°F | Ht 62.75 in | Wt 151.0 lb

## 2015-05-20 DIAGNOSIS — B9789 Other viral agents as the cause of diseases classified elsewhere: Principal | ICD-10-CM

## 2015-05-20 DIAGNOSIS — J069 Acute upper respiratory infection, unspecified: Secondary | ICD-10-CM | POA: Diagnosis not present

## 2015-05-20 MED ORDER — BENZONATATE 200 MG PO CAPS: 200.0000 mg | ORAL_CAPSULE | Freq: Three times a day (TID) | ORAL | Status: DC | PRN

## 2015-05-20 NOTE — Patient Instructions (Signed)
For viral upper respiratory infection  Rest and drink lots of fluids  An expectorant with DM (like mucinex DM) is good for congestion and cough  Tessalon (px) - also helps cough  Update if not starting to improve in a week or if worsening

## 2015-05-20 NOTE — Progress Notes (Signed)
Subjective:    Patient ID: Bridget [name], female    DOB: January 02, 1956, 59 y.o.   MRN: [id number]  HPI Here with uri symptoms Started 10 d with severe st (neg strep at [facility]) Then dry cough  Given px for zpak in case she did not get better  She filled it 4 d later and took it   Fever Sunday 101, Monday 100 , no more fever now  Then a lot of head and chest congestion  Cough is bad - phlegm is yellow/green  Hoarse  Back hurts from cough   Face does not hurt   Patient Active Problem List   Diagnosis Date Noted  . Low back pain 10/05/2014  . Contusion of rib on left side 09/30/2013  . Osteopenia 09/30/2013  . Insect bite of leg, infected 05/01/2013  . Gynecological examination 01/18/2012  . Routine general medical examination at a health care facility 01/09/2012  . [place], [place] 02/06/2010  . DEGENERATIVE JOINT DISEASE, MILD 07/19/2009   Past Medical History  Diagnosis Date  . 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 Chronic back pain    Past Surgical History  Procedure Laterality Date  . Ovarian cyst surgery  1973  . Cesarean section  1984 and 1986    hodp pre term labor CS  . Tubal ligation  1999  . Ankle surgery Right    History  Substance Use Topics  . Smoking status: Never Smoker   . Smokeless tobacco: Not on file  . Alcohol Use: 0.0 oz/week    0 Standard drinks or equivalent per week     Comment: 2-3 nights per week   Family History  Problem Relation Age of Onset  . Arthritis Mother     OA with verterabral fx  . Hypertension Mother   . Fibrocystic breast disease Mother   . Cancer Maternal Grandfather     colon CA  . Diabetes Paternal Grandmother    Allergies  Allergen Reactions  . Lorcet [Hydrocodone-Acetaminophen] Rash   Current Outpatient Prescriptions on File Prior to Visit  Medication Sig Dispense Refill  . ibuprofen (ADVIL,MOTRIN) 200 MG tablet Take 200 mg by mouth every 6 (six) hours as needed.    . meloxicam (MOBIC) 15 MG tablet Take 1  tablet (15 mg total) by mouth daily. (Patient taking differently: Take 15 mg by mouth daily as needed. ) 30 tablet 1   No current facility-administered medications on file prior to visit.      Review of Systems Review of Systems  Constitutional: Negative for fever, appetite change,  and unexpected weight change.  ENT pos for cong/rhinorrhea and ST , neg for sinus pain  Eyes: Negative for pain and visual disturbance.  Respiratory: Negative for wheeze  and shortness of breath.   Cardiovascular: Negative for cp or palpitations    Gastrointestinal: Negative for nausea, diarrhea and constipation.  Genitourinary: Negative for urgency and frequency.  Skin: Negative for pallor or rash   Neurological: Negative for weakness, light-headedness, numbness and headaches.  Hematological: Negative for adenopathy. Does not bruise/bleed easily.  Psychiatric/Behavioral: Negative for dysphoric mood. The patient is not nervous/anxious.         Objective:   Physical Exam  Constitutional: She appears well-developed and well-nourished. No distress.  overwt and well app  HENT:  Head: Normocephalic and atraumatic.  Right Ear: External ear normal.  Left Ear: External ear normal.  Mouth/Throat: Oropharynx is clear and moist.  Nares are injected and  congested  No sinus tenderness Clear rhinorrhea and post nasal drip   Eyes: Conjunctivae and EOM are normal. Pupils are equal, round, and reactive to light. Right eye exhibits no discharge. Left eye exhibits no discharge.  Neck: Normal range of motion. Neck supple.  Cardiovascular: Normal rate and normal heart sounds.   Pulmonary/Chest: Effort normal and breath sounds normal. No respiratory distress. She has no wheezes. She has no rales. She exhibits no tenderness.  Harsh bs Good air exch   Lymphadenopathy:    She has no cervical adenopathy.  Neurological: She is alert.  Skin: Skin is warm and dry. No rash noted.  Psychiatric: She has a normal mood and  affect.          Assessment & Plan:   Problem List Items Addressed This Visit    Viral URI with cough - Primary    Already tx with zpak Disc symptomatic care - see instructions on AVS - expectorant/DM Also tessalon prn for cough  Update if not starting to improve in a week or if worsening

## 2015-05-20 NOTE — Assessment & Plan Note (Signed)
Already tx with zpak Disc symptomatic care - see instructions on AVS - expectorant/DM Also tessalon prn for cough  Update if not starting to improve in a week or if worsening

## 2015-05-20 NOTE — Progress Notes (Signed)
Pre visit review using our clinic review tool, if applicable. No additional management support is needed unless otherwise documented below in the visit note. 

## 2015-06-20 ENCOUNTER — Telehealth: Payer: Self-pay | Admitting: Family Medicine

## 2015-06-20 NOTE — Telephone Encounter (Signed)
Pt called stating she still has her cough  And she wants to know if she needs chest xray. Dry hacky cough

## 2015-06-20 NOTE — Telephone Encounter (Signed)
Please f/u for an appt -will likely do cxr at that time  Thanks

## 2015-06-20 NOTE — Telephone Encounter (Signed)
appt scheduled

## 2015-06-22 ENCOUNTER — Ambulatory Visit (INDEPENDENT_AMBULATORY_CARE_PROVIDER_SITE_OTHER)
Admission: RE | Admit: 2015-06-22 | Discharge: 2015-06-22 | Disposition: A | Discharge status: Home / Self Care | Payer: 59 | Source: Ambulatory Visit | Attending: Family Medicine | Admitting: Family Medicine

## 2015-06-22 ENCOUNTER — Encounter: Payer: Self-pay | Admitting: Family Medicine

## 2015-06-22 ENCOUNTER — Ambulatory Visit (INDEPENDENT_AMBULATORY_CARE_PROVIDER_SITE_OTHER): Payer: 59 | Admitting: Family Medicine

## 2015-06-22 VITALS — BP 108/68 | HR 77 | Temp 98.3°F | Ht 62.75 in | Wt 154.5 lb

## 2015-06-22 DIAGNOSIS — J069 Acute upper respiratory infection, unspecified: Secondary | ICD-10-CM

## 2015-06-22 DIAGNOSIS — B9789 Other viral agents as the cause of diseases classified elsewhere: Principal | ICD-10-CM

## 2015-06-22 MED ORDER — PREDNISONE 10 MG PO TABS: ORAL_TABLET | ORAL | Status: DC

## 2015-06-22 MED ORDER — BENZONATATE 200 MG PO CAPS: 200.0000 mg | ORAL_CAPSULE | Freq: Three times a day (TID) | ORAL | Status: DC | PRN

## 2015-06-22 NOTE — Progress Notes (Signed)
Subjective:    Patient ID: Bridget [name], female    DOB: Jul 06, 1956, 59 y.o.   MRN: [id number]  HPI Here with ongoing cough s/p tx for uri in May Had zpak Also tessalon   Still has tickle in her throat and she coughs - dry and hacky  Non prod  No fever  Feels pretty good  Just a little tightness across back   ? If tessalon helped much   Patient Active Problem List   Diagnosis Date Noted  . Viral URI with cough 05/20/2015  . Low back pain 10/05/2014  . Contusion of rib on left side 09/30/2013  . Osteopenia 09/30/2013  . Insect bite of leg, infected 05/01/2013  . Gynecological examination 01/18/2012  . Routine general medical examination at a health care facility 01/09/2012  . [place], [place] 02/06/2010  . DEGENERATIVE JOINT DISEASE, MILD 07/19/2009   Past Medical History  Diagnosis Date  . 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 Chronic back pain    Past Surgical History  Procedure Laterality Date  . Ovarian cyst surgery  1973  . Cesarean section  1984 and 1986    hodp pre term labor CS  . Tubal ligation  1999  . Ankle surgery Right    History  Substance Use Topics  . Smoking status: Never Smoker   . Smokeless tobacco: Not on file  . Alcohol Use: 0.0 oz/week    0 Standard drinks or equivalent per week     Comment: 2-3 nights per week   Family History  Problem Relation Age of Onset  . Arthritis Mother     OA with verterabral fx  . Hypertension Mother   . Fibrocystic breast disease Mother   . Cancer Maternal Grandfather     colon CA  . Diabetes Paternal Grandmother    Allergies  Allergen Reactions  . Lorcet [Hydrocodone-Acetaminophen] Rash   Current Outpatient Prescriptions on File Prior to Visit  Medication Sig Dispense Refill  . benzonatate (TESSALON) 200 MG capsule Take 1 capsule (200 mg total) by mouth 3 (three) times daily as needed for cough (swallow whole). 30 capsule 0  . ibuprofen (ADVIL,MOTRIN) 200 MG tablet Take 200 mg by mouth every  6 (six) hours as needed.    . meloxicam (MOBIC) 15 MG tablet Take 1 tablet (15 mg total) by mouth daily. (Patient taking differently: Take 15 mg by mouth daily as needed. ) 30 tablet 1   No current facility-administered medications on file prior to visit.    Review of Systems Review of Systems  Constitutional: Negative for fever, appetite change, fatigue and unexpected weight change.  Eyes: Negative for pain and visual disturbance.  ENT pos for some post nasal drip/neg for sinus pain  Respiratory: Negative for wheeze and shortness of breath.   Cardiovascular: Negative for cp or palpitations    Gastrointestinal: Negative for nausea, diarrhea and constipation.  Genitourinary: Negative for urgency and frequency.  Skin: Negative for pallor or rash   Neurological: Negative for weakness, light-headedness, numbness and headaches.  Hematological: Negative for adenopathy. Does not bruise/bleed easily.  Psychiatric/Behavioral: Negative for dysphoric mood. The patient is not nervous/anxious.         Objective:   Physical Exam  Constitutional: She appears well-developed and well-nourished. No distress.  HENT:  Head: Normocephalic and atraumatic.  Right Ear: External ear normal.  Left Ear: External ear normal.  Mouth/Throat: Oropharynx is clear and moist.  Nares boggy  No sinus tenderness  Clear rhinorrhea and post nasal drip   Eyes: Conjunctivae and EOM are normal. Pupils are equal, round, and reactive to light. Right eye exhibits no discharge. Left eye exhibits no discharge.  Neck: Normal range of motion. Neck supple.  Cardiovascular: Normal rate and normal heart sounds.   Pulmonary/Chest: Effort normal and breath sounds normal. No respiratory distress. She has no wheezes. She has no rales. She exhibits no tenderness.  Lymphadenopathy:    She has no cervical adenopathy.  Neurological: She is alert.  Skin: Skin is warm and dry. No rash noted.  Psychiatric: She has a normal mood and affect.           Assessment & Plan:   Problem List Items Addressed This Visit    Viral URI with cough - Primary    Better except for cyclic cough  cxr today  Tessalon tid to stop cycle Prednisone taper  Update if not starting to improve in a week or if worsening          Relevant Orders   DG Chest 2 View (Completed)

## 2015-06-22 NOTE — Progress Notes (Signed)
Pre visit review using our clinic review tool, if applicable. No additional management support is needed unless otherwise documented below in the visit note. 

## 2015-06-22 NOTE — Assessment & Plan Note (Signed)
Better except for cyclic cough  cxr today  Tessalon tid to stop cycle Prednisone taper  Update if not starting to improve in a week or if worsening

## 2015-06-22 NOTE — Patient Instructions (Signed)
I think you have a cyclic cough Chest xray today just to be sure  Take tessalon every 8 hours  Take the prednisone taper  Update if not starting to improve in a week or if worsening

## 2015-06-23 ENCOUNTER — Encounter: Payer: Self-pay | Admitting: Family Medicine

## 2015-07-26 ENCOUNTER — Encounter: Payer: Self-pay | Admitting: Gastroenterology

## 2016-04-22 ENCOUNTER — Telehealth: Payer: Self-pay | Admitting: Family Medicine

## 2016-04-22 DIAGNOSIS — Z0000 Encounter for general adult medical examination without abnormal findings: Secondary | ICD-10-CM

## 2016-04-22 NOTE — Telephone Encounter (Signed)
-----   Message from Marchia Bond sent at 04/20/2016 10:41 AM EDT ----- Regarding: Cpx lab Mon 5/1, need orders. Thanks! :-) Please order  future cpx labs for pt's upcoming lab appt. Thanks Aniceto Boss

## 2016-04-23 ENCOUNTER — Other Ambulatory Visit (INDEPENDENT_AMBULATORY_CARE_PROVIDER_SITE_OTHER): Payer: 59

## 2016-04-23 DIAGNOSIS — Z0000 Encounter for general adult medical examination without abnormal findings: Secondary | ICD-10-CM

## 2016-04-23 LAB — COMPREHENSIVE METABOLIC PANEL
ALT: 12 U/L (ref 0–35)
AST: 16 U/L (ref 0–37)
Albumin: 4 g/dL (ref 3.5–5.2)
Alkaline Phosphatase: 55 U/L (ref 39–117)
BUN: 17 mg/dL (ref 6–23)
CALCIUM: 9 mg/dL (ref 8.4–10.5)
CHLORIDE: 107 meq/L (ref 96–112)
CO2: 28 meq/L (ref 19–32)
Creatinine, Ser: 0.66 mg/dL (ref 0.40–1.20)
GFR: 97.19 mL/min (ref >60.00)
GLUCOSE: 92 mg/dL (ref 70–99)
POTASSIUM: 4 meq/L (ref 3.5–5.1)
Sodium: 141 mEq/L (ref 135–145)
Total Bilirubin: 0.4 mg/dL (ref 0.2–1.2)
Total Protein: 6.7 g/dL (ref 6.0–8.3)

## 2016-04-23 LAB — LIPID PANEL
CHOL/HDL RATIO: 4
Cholesterol: 190 mg/dL (ref 0–200)
HDL: 53.3 mg/dL (ref >39.00)
LDL Cholesterol: 121 mg/dL — ABNORMAL HIGH (ref 0–99)
NONHDL: 136.22
Triglycerides: 74 mg/dL (ref 0.0–149.0)
VLDL: 14.8 mg/dL (ref 0.0–40.0)

## 2016-04-23 LAB — CBC WITH DIFFERENTIAL/PLATELET
BASOS ABS: 0 10*3/uL (ref 0.0–0.1)
BASOS PCT: 0.4 % (ref 0.0–3.0)
Eosinophils Absolute: 0.2 10*3/uL (ref 0.0–0.7)
Eosinophils Relative: 3.7 % (ref 0.0–5.0)
HCT: 39.2 % (ref 36.0–46.0)
Hemoglobin: 13.1 g/dL (ref 12.0–15.0)
Lymphocytes Relative: 24.6 % (ref 12.0–46.0)
Lymphs Abs: 1.5 10*3/uL (ref 0.7–4.0)
MCHC: 33.5 g/dL (ref 30.0–36.0)
MCV: 86.5 fl (ref 78.0–100.0)
MONOS PCT: 7.4 % (ref 3.0–12.0)
Monocytes Absolute: 0.4 10*3/uL (ref 0.1–1.0)
NEUTROS ABS: 3.8 10*3/uL (ref 1.4–7.7)
Neutrophils Relative %: 63.9 % (ref 43.0–77.0)
PLATELETS: 292 10*3/uL (ref 150.0–400.0)
RBC: 4.53 Mil/uL (ref 3.87–5.11)
RDW: 14.3 % (ref 11.5–15.5)
WBC: 5.9 10*3/uL (ref 4.0–10.5)

## 2016-04-23 LAB — TSH: TSH: 1.48 u[IU]/mL (ref 0.35–4.50)

## 2016-04-27 ENCOUNTER — Other Ambulatory Visit (HOSPITAL_COMMUNITY)
Admission: RE | Admit: 2016-04-27 | Discharge: 2016-04-27 | Disposition: A | Discharge status: Home / Self Care | Payer: 59 | Source: Ambulatory Visit | Attending: Family Medicine | Admitting: Family Medicine

## 2016-04-27 ENCOUNTER — Ambulatory Visit (INDEPENDENT_AMBULATORY_CARE_PROVIDER_SITE_OTHER): Payer: 59 | Admitting: Family Medicine

## 2016-04-27 ENCOUNTER — Encounter: Payer: Self-pay | Admitting: Family Medicine

## 2016-04-27 VITALS — BP 98/62 | HR 76 | Temp 98.0°F | Ht 62.75 in | Wt 151.2 lb

## 2016-04-27 DIAGNOSIS — Z0000 Encounter for general adult medical examination without abnormal findings: Secondary | ICD-10-CM | POA: Diagnosis not present

## 2016-04-27 DIAGNOSIS — Z1151 Encounter for screening for human papillomavirus (HPV): Secondary | ICD-10-CM | POA: Diagnosis not present

## 2016-04-27 DIAGNOSIS — M8580 Other specified disorders of bone density and structure, unspecified site: Secondary | ICD-10-CM

## 2016-04-27 DIAGNOSIS — Z01419 Encounter for gynecological examination (general) (routine) without abnormal findings: Secondary | ICD-10-CM | POA: Diagnosis not present

## 2016-04-27 MED ORDER — MELOXICAM 15 MG PO TABS: 15.0000 mg | ORAL_TABLET | Freq: Every day | ORAL | Status: DC | PRN

## 2016-04-27 NOTE — Patient Instructions (Signed)
Try Miralax for chronic constipation Don't forget to schedule a mammogram  Let us know if you had a tetanus shot (Tdap)- if not - make a nurse appt. To get one here  Start taking vitamin D3 1000 iu daily in addition to a healthy diet  For cholesterol   Avoid red meat/ fried foods/ egg yolks/ fatty breakfast meats/ butter, cheese and high fat dairy/ and shellfish

## 2016-04-27 NOTE — Progress Notes (Signed)
Pre visit review using our clinic review tool, if applicable. No additional management support is needed unless otherwise documented below in the visit note. 

## 2016-04-27 NOTE — Progress Notes (Signed)
Subjective:    Patient ID: Bridget [name], female    DOB: 06/24/1956, 60 y.o.   MRN: [id number]  HPI Here for health maintenance exam and to review chronic medical problems    Feeling pretty good overall  Feels her age more lately    Wt is down 3 lb with bmi of 37 Taking care of herself - plays tennis and walks the dog  Working on weight loss (had gone up to 154) Doing weight watchers at [employer] using it on line    Hep C/HIV screen- not high risk /declines   Pap 1/13 nl Has not had one since  Will do one today  No gyn problems or symptoms / she is monogamous   Still suffers from chronic constipation -takes dulcolax when she needs it / gets bloated  Lots of fiber and fluids  Has not tried miralax   Mm 12/15 nl- did not do one this dec  [facility]  Self breast exam-no lumps or changes   Flu shot- had one this fall   Also had to have a hep B booster   Td 8/07- thinks she may have had one recently - will check on it (Tdap)   Colonoscopy 10/10 nl with 10 year recall   dexa 10/14 -osteopenia  Does not take vitamin D  Eats dairy and drinks milk  Had one fall 2 y ago on the tennis court -no fractures  Not interested in another dexa right now   Cholesterol Lab Results  Component Value Date   CHOL 190 04/23/2016   CHOL 178 09/21/2013   CHOL 191 01/10/2012   Lab Results  Component Value Date   HDL 53.30 04/23/2016   HDL 50.70 09/21/2013   HDL 49.50 01/10/2012   Lab Results  Component Value Date   LDLCALC 121* 04/23/2016   LDLCALC 116* 09/21/2013   LDLCALC 127* 01/10/2012   Lab Results  Component Value Date   TRIG 74.0 04/23/2016   TRIG 55.0 09/21/2013   TRIG 73.0 01/10/2012   Lab Results  Component Value Date   CHOLHDL 4 04/23/2016   CHOLHDL 4 09/21/2013   CHOLHDL 4 01/10/2012   No results found for: LDLDIRECT  Healthy diet   Results for orders placed or performed in visit on 04/23/16  CBC with Differential/Platelet  Result  Value Ref Range   WBC 5.9 4.0 - 10.5 K/uL   RBC 4.53 3.87 - 5.11 Mil/uL   Hemoglobin 13.1 12.0 - 15.0 g/dL   HCT 39.2 36.0 - 46.0 %   MCV 86.5 78.0 - 100.0 fl   MCHC 33.5 30.0 - 36.0 g/dL   RDW 14.3 11.5 - 15.5 %   Platelets 292.0 150.0 - 400.0 K/uL   Neutrophils Relative % 63.9 43.0 - 77.0 %   Lymphocytes Relative 24.6 12.0 - 46.0 %   Monocytes Relative 7.4 3.0 - 12.0 %   Eosinophils Relative 3.7 0.0 - 5.0 %   Basophils Relative 0.4 0.0 - 3.0 %   Neutro Abs 3.8 1.4 - 7.7 K/uL   Lymphs Abs 1.5 0.7 - 4.0 K/uL   Monocytes Absolute 0.4 0.1 - 1.0 K/uL   Eosinophils Absolute 0.2 0.0 - 0.7 K/uL   Basophils Absolute 0.0 0.0 - 0.1 K/uL  Comprehensive metabolic panel  Result Value Ref Range   Sodium 141 135 - 145 mEq/L   Potassium 4.0 3.5 - 5.1 mEq/L   Chloride 107 96 - 112 mEq/L   CO2 28 19 -  32 mEq/L   Glucose, Bld 92 70 - 99 mg/dL   BUN 17 6 - 23 mg/dL   Creatinine, Ser 0.66 0.40 - 1.20 mg/dL   Total Bilirubin 0.4 0.2 - 1.2 mg/dL   Alkaline Phosphatase 55 39 - 117 U/L   AST 16 0 - 37 U/L   ALT 12 0 - 35 U/L   Total Protein 6.7 6.0 - 8.3 g/dL   Albumin 4.0 3.5 - 5.2 g/dL   Calcium 9.0 8.4 - 10.5 mg/dL   GFR 97.19 >60.00 mL/min  Lipid panel  Result Value Ref Range   Cholesterol 190 0 - 200 mg/dL   Triglycerides 74.0 0.0 - 149.0 mg/dL   HDL 53.30 >39.00 mg/dL   VLDL 14.8 0.0 - 40.0 mg/dL   LDL Cholesterol 121 (H) 0 - 99 mg/dL   Total CHOL/HDL Ratio 4    NonHDL 136.22   TSH  Result Value Ref Range   TSH 1.48 0.35 - 4.50 uIU/mL    Patient Active Problem List   Diagnosis Date Noted  . Low back pain 10/05/2014  . Contusion of rib on left side 09/30/2013  . Osteopenia 09/30/2013  . Insect bite of leg, infected 05/01/2013  . Encounter for routine gynecological examination 01/18/2012  . Routine general medical examination at a health care facility 01/09/2012  . [place], [place] 02/06/2010  . DEGENERATIVE JOINT DISEASE, MILD 07/19/2009   Past Medical History  Diagnosis  Date  . 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 Chronic back pain    Past Surgical History  Procedure Laterality Date  . Ovarian cyst surgery  1973  . Cesarean section  1984 and 1986    hodp pre term labor CS  . Tubal ligation  1999  . Ankle surgery Right    Social History  Substance Use Topics  . Smoking status: Never Smoker   . Smokeless tobacco: None  . Alcohol Use: 0.0 oz/week    0 Standard drinks or equivalent per week     Comment: 2-3 nights per week   Family History  Problem Relation Age of Onset  . Arthritis Mother     OA with verterabral fx  . Hypertension Mother   . Fibrocystic breast disease Mother   . Cancer Maternal Grandfather     colon CA  . Diabetes Paternal Grandmother    Allergies  Allergen Reactions  . Lorcet [Hydrocodone-Acetaminophen] Rash   Current Outpatient Prescriptions on File Prior to Visit  Medication Sig Dispense Refill  . ibuprofen (ADVIL,MOTRIN) 200 MG tablet Take 200 mg by mouth every 6 (six) hours as needed.     No current facility-administered medications on file prior to visit.    Review of Systems Review of Systems  Constitutional: Negative for fever, appetite change, fatigue and unexpected weight change.  Eyes: Negative for pain and visual disturbance.  Respiratory: Negative for cough and shortness of breath.   Cardiovascular: Negative for cp or palpitations    Gastrointestinal: Negative for nausea, diarrhea and constipation.  Genitourinary: Negative for urgency and frequency.  Skin: Negative for pallor or rash   Neurological: Negative for weakness, light-headedness, numbness and headaches.  Hematological: Negative for adenopathy. Does not bruise/bleed easily.  Psychiatric/Behavioral: Negative for dysphoric mood. The patient is not nervous/anxious.         Objective:   Physical Exam  Constitutional: She appears well-developed and well-nourished. No distress.  overwt and well appearing   HENT:  Head: Normocephalic and  atraumatic.  Right  Ear: External ear normal.  Left Ear: External ear normal.  Mouth/Throat: Oropharynx is clear and moist.  Eyes: Conjunctivae and EOM are normal. Pupils are equal, round, and reactive to light. No scleral icterus.  Neck: Normal range of motion. Neck supple. No JVD present. Carotid bruit is not present. No thyromegaly present.  Cardiovascular: Normal rate, regular rhythm, normal heart sounds and intact distal pulses.  Exam reveals no gallop.   Pulmonary/Chest: Effort normal and breath sounds normal. No respiratory distress. She has no wheezes. She exhibits no tenderness.  Abdominal: Soft. Bowel sounds are normal. She exhibits no distension, no abdominal bruit and no mass. There is no tenderness.  Genitourinary: Vagina normal and uterus normal. No breast swelling, tenderness, discharge or bleeding. There is no rash, tenderness or lesion on the right labia. There is no rash, tenderness or lesion on the left labia. Uterus is not enlarged and not tender. Cervix exhibits no motion tenderness, no discharge and no friability. Right adnexum displays no mass, no tenderness and no fullness. Left adnexum displays no mass, no tenderness and no fullness. No bleeding in the vagina. No vaginal discharge found.  Breast exam: No mass, nodules, thickening, tenderness, bulging, retraction, inflamation, nipple discharge or skin changes noted.  No axillary or clavicular LA.       Musculoskeletal: Normal range of motion. She exhibits no edema or tenderness.  No kyphosis   Lymphadenopathy:    She has no cervical adenopathy.  Neurological: She is alert. She has normal reflexes. No cranial nerve deficit. She exhibits normal muscle tone. Coordination normal.  Skin: Skin is warm and dry. No rash noted. No erythema. No pallor.  Psychiatric: She has a normal mood and affect.          Assessment & Plan:   Problem List Items Addressed This Visit      Musculoskeletal and Integument   Osteopenia     Not interested in another dexa yet  Rev last one from 2014 No new fx or falls   Disc need for calcium/ vitamin D/ wt bearing exercise and bone density test every 2 y to monitor Disc safety/ fracture risk in detail          Other   Encounter for routine gynecological examination    Exam and pap done No gyn c/o      Relevant Orders   Cytology - PAP   Routine general medical examination at a health care facility - Primary    Reviewed health habits including diet and exercise and skin cancer prevention Reviewed appropriate screening tests for age  Also reviewed health mt list, fam hx and immunization status , as well as social and family history   See HPI Labs reviewed Try Miralax for chronic constipation Don't forget to schedule a mammogram  Let us know if you had a tetanus shot (Tdap)- if not - make a nurse appt. To get one here  Start taking vitamin D3 1000 iu daily in addition to a healthy diet  For cholesterol   Avoid red meat/ fried foods/ egg yolks/ fatty breakfast meats/ butter, cheese and high fat dairy/ and shellfish

## 2016-04-29 NOTE — Assessment & Plan Note (Signed)
Exam and pap done No gyn c/o

## 2016-04-29 NOTE — Assessment & Plan Note (Signed)
Reviewed health habits including diet and exercise and skin cancer prevention Reviewed appropriate screening tests for age  Also reviewed health mt list, fam hx and immunization status , as well as social and family history   See HPI Labs reviewed Try Miralax for chronic constipation Don't forget to schedule a mammogram  Let us know if you had a tetanus shot (Tdap)- if not - make a nurse appt. To get one here  Start taking vitamin D3 1000 iu daily in addition to a healthy diet  For cholesterol   Avoid red meat/ fried foods/ egg yolks/ fatty breakfast meats/ butter, cheese and high fat dairy/ and shellfish

## 2016-04-29 NOTE — Assessment & Plan Note (Signed)
Not interested in another dexa yet  Rev last one from 2014 No new fx or falls   Disc need for calcium/ vitamin D/ wt bearing exercise and bone density test every 2 y to monitor Disc safety/ fracture risk in detail

## 2016-04-30 LAB — CYTOLOGY - PAP

## 2016-05-31 MED FILL — MELOXICAM 15 MG TABLET: 15 | 30 days supply | Qty: 30 | Fill #0

## 2016-06-11 ENCOUNTER — Telehealth: Payer: Self-pay

## 2016-06-11 DIAGNOSIS — M545 Low back pain, unspecified: Secondary | ICD-10-CM | POA: Insufficient documentation

## 2016-06-11 NOTE — Telephone Encounter (Signed)
Pt notified of Dr. Marliss Coots comments and that referral for PT for back done, appt scheduled with Dr. Lorelei Pont to eval shoulder

## 2016-06-11 NOTE — Telephone Encounter (Signed)
I did the referral for PT for back  If the shoulder issue is new- will need eval before recommending PT - since we do not know the dx A visit with Dr Lorelei Pont for shoulder would be helpful- he may be able to recommend exercises as well

## 2016-06-11 NOTE — Telephone Encounter (Signed)
Pt was seen annual exam on 04/27/16; in 2015 pt went to [facility] PT for back issues; 06/10/16 pt was helping with new grand baby and pt was sleeping on air mattress; now both sides of lower back is hurting and pt has had rt shoulder pain for 2 weeks after picking up case of water. Pt request PT referral. Pt said since seen for yearly in May pt does not want to schedule appt until ask Dr Glori Bickers if she will order PT for back and shoulder. Pt request cb. Pt knows Dr Glori Bickers is out of office today.

## 2016-06-13 ENCOUNTER — Ambulatory Visit: Payer: 59 | Attending: Family Medicine | Admitting: Physical Therapy

## 2016-06-13 ENCOUNTER — Encounter: Payer: Self-pay | Admitting: Physical Therapy

## 2016-06-13 DIAGNOSIS — M5441 Lumbago with sciatica, right side: Secondary | ICD-10-CM | POA: Diagnosis not present

## 2016-06-13 NOTE — Therapy (Signed)
[place] MAIN [facility] SERVICES [address] [place], [place], [address] Phone: [phone]   Fax:  [phone]  Physical Therapy Evaluation  Patient Details  Name: Bridget [name] MRN: [id number] Date of Birth: November 03, 1956 Referring Provider: Dr. Glori Bickers  Encounter Date: 06/13/2016      PT End of Session - 06/13/16 1200    Visit Number 1   Number of Visits 17   Date for PT Re-Evaluation 08/08/16   PT Start Time [id number]   PT Stop Time 1230   PT Time Calculation (min) 45 min   Activity Tolerance Patient limited by pain   Behavior During Therapy [facility] for tasks assessed/performed      Past Medical History  Diagnosis Date  . 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 Chronic back pain     Past Surgical History  Procedure Laterality Date  . Ovarian cyst surgery  1973  . Cesarean section  1984 and 1986    hodp pre term labor CS  . Tubal ligation  1999  . Ankle surgery Right     There were no vitals filed for this visit.       Subjective Assessment - 06/13/16 1155    Subjective Patient is having back pain 5/10 and it 06/10/16   Pertinent History  patients clinical presentation is evolving, rotator cuff treatment at another clinic, low back pain 10/ 15    Limitations Sitting;Lifting;Standing;Walking   How long can you sit comfortably? 15 minutes   How long can you stand comfortably? 30 minutes   How long can you walk comfortably? 5- to 10 minutes   Diagnostic tests no   Patient Stated Goals to be able to walk better, lift and carry   Currently in Pain? Yes   Pain Score 5    Pain Location Back   Pain Orientation Right   Pain Descriptors / Indicators Aching   Pain Type Acute pain   Pain Radiating Towards RLE   Pain Onset In the past 7 days   Pain Frequency Constant   Aggravating Factors  bending. lifting, walking, moving            [facility] PT Assessment - 06/13/16 0001    Assessment   Medical Diagnosis low back pain   Referring  Provider Dr. Glori Bickers   Onset Date/Surgical Date 06/10/16   Hand Dominance Right   Prior Therapy yes   Precautions   Precautions None   Restrictions   Weight Bearing Restrictions No   Balance Screen   Has the patient fallen in the past 6 months No   Has the patient had a decrease in activity level because of a fear of falling?  Yes   Is the patient reluctant to leave their home because of a fear of falling?  No   Home Environment   Living Environment Private residence   Living Arrangements Spouse/significant other   Available Help at Discharge Family   Type of Garden to enter   Entrance Stairs-Number of Steps 6   Entrance Stairs-Rails Can reach both   [place] Two level   Alternate Level Stairs-Number of Steps 12   Alternate Level Stairs-Rails Can reach both   [place] None   Prior Function   Level of Independence Independent   Vocation Part time employment   Leisure walking, tennis   Cognition   Overall Cognitive Status Within Functional Limits for tasks assessed  Attention Focused      PAIN: pain with palpation paraspinal musculature, + spring test L1- L5   POSTURE: WNL   PROM/AROM: WNL BLE Trunk flex painful, trunk extesion painful, SBL  Painful and SBR painful   STRENGTH:  Graded on a 0-5 scale Muscle Group Left Right  Shoulder flex    Shoulder Abd    Shoulder Ext    Shoulder IR/ER    Elbow    Wrist/hand    Hip Flex 4 4  Hip Abd 4 4  Hip Add 4 4  Hip Ext 3 3  Hip IR/ER    Knee Flex 5 5  Knee Ext 5 5  Ankle DF 5 5  Ankle PF 5 5   SENSATION: numbness right shin   SPECIAL TESTS: + spring test L1, L2, L4, L5,  Negative FABER bilaterally, negative scour bilaterally   FUNCTIONAL MOBILITY: guarded mobility with transfers   BALANCE: WFL   GAIT: guarded and slow gait speed.  OUTCOME MEASURES: TEST Outcome Interpretation      10 meter walk test    .64             m/s <1.0 m/s indicates increased risk for falls;  limited community ambulator                                                                        PT Education - 06/13/16 1159    Education provided Yes   Education Details standing extension exercises   Person(s) Educated Patient   Methods Explanation   Comprehension Verbalized understanding             PT Long Term Goals - 06/13/16 1327    PT LONG TERM GOAL #1   Title Patient will be independent in home exercise program to improve strength/mobility for better functional independence with ADLs.   Time 8   Period Weeks   Status New   PT LONG TERM GOAL #2   Title . Patient will increase 10 meter walk test to >1.49m/s as to improve gait speed for better community ambulation and to reduce fall risk   Time 8   Period Weeks   Status New   PT LONG TERM GOAL #3   Title Patient will be able to perform household work/ chores without increase in symptoms.   Time 8   Period Weeks   Status New   PT LONG TERM GOAL #4   Title Patient will report a worst pain of 3/10 on VAS in     back        to improve tolerance with ADLs and reduced symptoms with activities.    Time 8   Period Weeks   Status New               Plan - 06/13/16 1318    Clinical Impression Statement Patient presents with painful trunk flex/ext/SB left and SB right and left constant pain that ranges from 5/10 to 8/10. Patient has + spring test L1- L5, negative SLR, negative Prone flex test, negative FABER, negative scour BLE.  Patietnt has guarded  mobility with pain and slow gait speed  with increased  pain in sitting, standing, walking.    Rehab Potential Good  PT Frequency 2x / week   PT Duration 8 weeks   PT Treatment/Interventions Therapeutic exercise;Manual techniques   PT Next Visit Plan manual therapy   Consulted and Agree with Plan of Care Patient      Patient will benefit from skilled therapeutic intervention in order to improve the following deficits and impairments:   Abnormal gait, Pain, Decreased mobility, Decreased activity tolerance, Decreased range of motion, Decreased strength, Difficulty walking  Visit Diagnosis: Right-sided low back pain with right-sided sciatica     Problem List Patient Active Problem List   Diagnosis Date Noted  . Low back pain 06/11/2016  . Osteopenia 09/30/2013  . Encounter for routine gynecological examination 01/18/2012  . Routine general medical examination at a health care facility 01/09/2012  . [place], [place] 02/06/2010  . DEGENERATIVE JOINT DISEASE, MILD 07/19/2009   Alanson Puls, PT, DPT  [place], [place] S 06/13/2016, 1:29 PM  [place] MAIN [facility] SERVICES [address] [place], [place], [address] Phone: [phone]   Fax:  [phone]  Name: Bridget [name] MRN: [id number] Date of Birth: February 01, 1956

## 2016-06-15 ENCOUNTER — Ambulatory Visit (INDEPENDENT_AMBULATORY_CARE_PROVIDER_SITE_OTHER): Payer: 59 | Admitting: Family Medicine

## 2016-06-15 ENCOUNTER — Encounter: Payer: Self-pay | Admitting: Family Medicine

## 2016-06-15 ENCOUNTER — Ambulatory Visit
Admission: RE | Admit: 2016-06-15 | Discharge: 2016-06-15 | Disposition: A | Discharge status: Home / Self Care | Payer: 59 | Source: Ambulatory Visit | Attending: Family Medicine | Admitting: Family Medicine

## 2016-06-15 ENCOUNTER — Ambulatory Visit (INDEPENDENT_AMBULATORY_CARE_PROVIDER_SITE_OTHER)
Admission: RE | Admit: 2016-06-15 | Discharge: 2016-06-15 | Disposition: A | Discharge status: Home / Self Care | Payer: 59 | Source: Ambulatory Visit | Attending: Family Medicine | Admitting: Family Medicine

## 2016-06-15 VITALS — BP 112/62 | HR 83 | Temp 98.2°F | Wt 155.5 lb

## 2016-06-15 DIAGNOSIS — M25511 Pain in right shoulder: Secondary | ICD-10-CM | POA: Diagnosis not present

## 2016-06-15 DIAGNOSIS — M75111 Incomplete rotator cuff tear or rupture of right shoulder, not specified as traumatic: Secondary | ICD-10-CM

## 2016-06-15 NOTE — Patient Instructions (Signed)

## 2016-06-15 NOTE — Progress Notes (Signed)
Dr. Frederico Hamman T. Floride Hutmacher, MD, [facility] [place], [address] Phone: [phone] Fax: [phone]  06/15/2016  Patient: Bridget [name], MRN: [id number], DOB: Mar 08, 1956, 60 y.o.  Primary Physician:  Loura Pardon, MD   Chief Complaint  Patient presents with  . Shoulder Pain    right   Subjective:   This 60 y.o. female patient noted above presents with shoulder pain that has been ongoing for 1 mo there is no history of trauma or accident recently, but she did have some shoulder pain after she was lifting some water approximately at the time of onset. The patient denies neck pain or radicular symptoms. Denies dislocation, subluxation, separation of the shoulder. The patient does complain of pain in the overhead plane with significant painful arc of motion.  R shoulder pain anteriorly and laterally. About 1 month ago - thinks when lifting some water.  No surgery.   RHD  Med technologist - [place].   Medications Tried: Tylenol, NSAIDS Ice or Heat: minimally helpful Tried PT: No  Prior shoulder Injury: No Prior surgery: No Prior fracture: No  The PMH, PSH, Social History, Family History, Medications, and allergies have been reviewed in [facility], and have been updated if relevant.  Patient Active Problem List   Diagnosis Date Noted  . Low back pain 06/11/2016  . Osteopenia 09/30/2013  . Encounter for routine gynecological examination 01/18/2012  . Routine general medical examination at a health care facility 01/09/2012  . [place], [place] 02/06/2010  . DEGENERATIVE JOINT DISEASE, MILD 07/19/2009    Past Medical History  Diagnosis Date  . 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 Chronic back pain     Past Surgical History  Procedure Laterality Date  . Ovarian cyst surgery  1973  . Cesarean section  1984 and 1986    hodp pre term labor CS  . Tubal ligation  1999  . Ankle surgery Right     Social  History   Social History  . Marital Status: Married    Spouse Name: N/A  . Number of Children: N/A  . Years of Education: N/A   Occupational History  . Not on file.   Social History Main Topics  . Smoking status: Never Smoker   . Smokeless tobacco: Not on file  . Alcohol Use: 0.0 oz/week    0 Standard drinks or equivalent per week     Comment: 2-3 nights per week  . Drug Use: No  . Sexual Activity: Not on file   Other Topics Concern  . Not on file   Social History Narrative    Family History  Problem Relation Age of Onset  . Arthritis Mother     OA with verterabral fx  . Hypertension Mother   . Fibrocystic breast disease Mother   . Cancer Maternal Grandfather     colon CA  . Diabetes Paternal Grandmother     Allergies  Allergen Reactions  . Lorcet [Hydrocodone-Acetaminophen] Rash    Medication list reviewed and updated in full in [facility].  GEN: No fevers, chills. Nontoxic. Primarily MSK c/o today. MSK: Detailed in the HPI GI: tolerating PO intake without difficulty Neuro: No numbness, parasthesias, or tingling associated. Otherwise the pertinent positives of the ROS are noted above.   Objective:   Blood pressure 112/62, pulse 83, temperature 98.2 F (36.8 C), temperature source Oral, weight 155 lb 8 oz (70.534 kg), SpO2 96 %.  GEN: Well-developed,well-nourished,in no acute distress; alert,appropriate and cooperative throughout examination HEENT: Normocephalic and atraumatic without obvious abnormalities. Ears, externally no deformities PULM: Breathing comfortably in no respiratory distress EXT: No clubbing, cyanosis, or edema PSYCH: Normally interactive. Cooperative during the interview. Pleasant. Friendly and conversant. Not anxious or depressed appearing. Normal, full affect.  Shoulder: R Inspection: No muscle wasting or winging Ecchymosis/edema: neg  AC joint, scapula, clavicle: NT Cervical spine: NT, full ROM Spurling's: neg Abduction:  full, 4/5, painful arc of motion Flexion: full, 5/5 IR, full, lift-off: 5/5 ER at neutral: full, 5/5 AC crossover: neg Neer: pos Hawkins: pos Drop Test: neg Empty Can: pos Supraspinatus insertion: mild-mod T Bicipital groove: NT Speed's: neg Yergason's: neg Sulcus sign: neg Scapular dyskinesis: none C5-T1 intact  Neuro: Sensation intact Grip 5/5   Radiology: Dg Shoulder Right  06/15/2016  CLINICAL DATA:  Right shoulder pain. EXAM: RIGHT SHOULDER - 2+ VIEW COMPARISON:  None. FINDINGS: There is no evidence of fracture or dislocation. There is no evidence of arthropathy or other focal bone abnormality. Soft tissues are unremarkable. IMPRESSION: Negative. Electronically Signed   By: Rolm Baptise M.D.   On: 06/15/2016 10:58    Assessment and Plan:    Right shoulder pain - Plan: Ambulatory referral to Physical Therapy, DG Shoulder Right, CANCELED: DG Shoulder 1V Right  Partial tear of rotator cuff, right - Plan: Ambulatory referral to Physical Therapy, DG Shoulder Right, CANCELED: DG Shoulder 1V Right  Altered mechanics, probable partial thickness rotator cuff tear, likely there is some tendinitis and bursitis.  Full-thickness rotator cuff tear cannot fully blue excluded.  At this point the patient would like to be conservative and do a trial of physical therapy prior to doing any imaging such as an MRI to confirm this.  I think that that is a reasonable approach, and she has good understanding of potential risk.  Retraining shoulder mechanics and function was emphasized to the patient with rehab done at least 5-6 days a week.  The patient could benefit from formal PT to assist with scapular stabilization and RTC strengthening.   Follow-up: 4-6 weeks  Orders Placed This Encounter  Procedures  . DG Shoulder Right  . Ambulatory referral to Physical Therapy    Signed,  Frederico Hamman T. Marvis Saefong, MD   Patient's Medications  New Prescriptions   No medications on file  Previous  Medications   IBUPROFEN (ADVIL,MOTRIN) 200 MG TABLET    Take 200 mg by mouth every 6 (six) hours as needed.   MELOXICAM (MOBIC) 15 MG TABLET    Take 1 tablet (15 mg total) by mouth daily as needed for pain (take with food).  Modified Medications   No medications on file  Discontinued Medications   No medications on file

## 2016-06-15 NOTE — Progress Notes (Signed)
Pre visit review using our clinic review tool, if applicable. No additional management support is needed unless otherwise documented below in the visit note. 

## 2016-06-19 ENCOUNTER — Ambulatory Visit: Payer: 59 | Admitting: Physical Therapy

## 2016-06-19 DIAGNOSIS — M5441 Lumbago with sciatica, right side: Secondary | ICD-10-CM

## 2016-06-19 NOTE — Therapy (Signed)
[place] MAIN [facility] SERVICES [address] [place], [place], [address] Phone: [phone]   Fax:  [phone]  Physical Therapy Treatment  Patient Details  Name: MELEK KLEMPNER MRN: [id number] Date of Birth: 1956/06/14 Referring Provider: Dr. Glori Bickers  Encounter Date: 06/19/2016      PT End of Session - 06/19/16 0834    Visit Number 2   Number of Visits 17   Date for PT Re-Evaluation 08/08/16   PT Start Time 0830   PT Stop Time 0910   PT Time Calculation (min) 40 min   Activity Tolerance Patient limited by pain   Behavior During Therapy [facility] for tasks assessed/performed      Past Medical History  Diagnosis Date  . 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 Chronic back pain     Past Surgical History  Procedure Laterality Date  . Ovarian cyst surgery  1973  . Cesarean section  1984 and 1986    hodp pre term labor CS  . Tubal ligation  1999  . Ankle surgery Right     There were no vitals filed for this visit.      Subjective Assessment - 06/19/16 0832    Subjective Patient is having back pain 3/-4/10 and it 06/10/16   Pertinent History  patients clinical presentation is evolving, rotator cuff treatment at another clinic, low back pain 10/ 15    Limitations Sitting;Lifting;Standing;Walking   How long can you sit comfortably? 15 minutes   How long can you stand comfortably? 30 minutes   How long can you walk comfortably? 5- to 10 minutes   Diagnostic tests no   Patient Stated Goals to be able to walk better, lift and carry   Pain Score 4    Pain Location Back   Pain Orientation Right   Pain Descriptors / Indicators Aching   Pain Onset In the past 7 days   Multiple Pain Sites Yes  right shoulder 3/10     Manual therapy PA glides , grade 3 L 1- L5 and S1  30 bouts STM to SI to right paraspinal muscles  Prone extension  X 10   E-stim  IFC to right paraspinal musculature x 20 minutes crossed pattern:  Patient has 4-5/10  prior to therapy and 0/10 following therapy.                            PT Education - 06/19/16 [phone]    Education provided Yes   Education Details standing back extension   Person(s) Educated Patient   Methods Explanation   Comprehension Verbalized understanding             PT Long Term Goals - 06/13/16 1327    PT LONG TERM GOAL #1   Title Patient will be independent in home exercise program to improve strength/mobility for better functional independence with ADLs.   Time 8   Period Weeks   Status New   PT LONG TERM GOAL #2   Title . Patient will increase 10 meter walk test to >1.62m/s as to improve gait speed for better community ambulation and to reduce fall risk   Time 8   Period Weeks   Status New   PT LONG TERM GOAL #3   Title Patient will be able to perform household work/ chores without increase in symptoms.   Time 8   Period Weeks   Status New  PT LONG TERM GOAL #4   Title Patient will report a worst pain of 3/10 on VAS in     back        to improve tolerance with ADLs and reduced symptoms with activities.    Time 8   Period Weeks   Status New               Plan - 06/19/16 [id number]    Clinical Impression Statement Patient has 4-5/10 back pain to L1-L5, S1 and R SI musculature. She responds to manual therapy and e-stim to Low back right sided paraspinal muscles.   Rehab Potential Good   PT Frequency 2x / week   PT Duration 8 weeks   PT Treatment/Interventions Therapeutic exercise;Manual techniques   PT Next Visit Plan manual therapy   Consulted and Agree with Plan of Care Patient      Patient will benefit from skilled therapeutic intervention in order to improve the following deficits and impairments:  Abnormal gait, Pain, Decreased mobility, Decreased activity tolerance, Decreased range of motion, Decreased strength, Difficulty walking  Visit Diagnosis: Right-sided low back pain with right-sided sciatica     Problem  List Patient Active Problem List   Diagnosis Date Noted  . Low back pain 06/11/2016  . Osteopenia 09/30/2013  . Encounter for routine gynecological examination 01/18/2012  . Routine general medical examination at a health care facility 01/09/2012  . [place], [place] 02/06/2010  . DEGENERATIVE JOINT DISEASE, MILD 07/19/2009  Alanson Puls, PT, DPT  [place], Minette Headland S 06/19/2016, 9:02 AM  [place] MAIN [facility] SERVICES [address] [place], [place], [address] Phone: [phone]   Fax:  [phone]  Name: REJEANNE MADEY MRN: [id number] Date of Birth: Oct 08, 1956

## 2016-06-20 ENCOUNTER — Ambulatory Visit: Payer: 59 | Admitting: Physical Therapy

## 2016-06-20 ENCOUNTER — Encounter: Payer: Self-pay | Admitting: Physical Therapy

## 2016-06-20 DIAGNOSIS — M5441 Lumbago with sciatica, right side: Secondary | ICD-10-CM | POA: Diagnosis not present

## 2016-06-20 NOTE — Therapy (Signed)
[place] MAIN [facility] SERVICES [address] [place], [place], [address] Phone: [phone]   Fax:  [phone]  Physical Therapy Treatment  Patient Details  Name: Bridget [name] MRN: [id number] Date of Birth: 1956-10-19 Referring Provider: Dr. Glori Bickers  Encounter Date: 06/20/2016      PT End of Session - 06/20/16 1508    Visit Number 3   Number of Visits 17   Date for PT Re-Evaluation 08/08/16   PT Start Time 0230   PT Stop Time 0315   PT Time Calculation (min) 45 min   Activity Tolerance Patient limited by pain   Behavior During Therapy [facility] for tasks assessed/performed      Past Medical History  Diagnosis Date  . 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 Chronic back pain     Past Surgical History  Procedure Laterality Date  . Ovarian cyst surgery  1973  . Cesarean section  1984 and 1986    hodp pre term labor CS  . Tubal ligation  1999  . Ankle surgery Right     There were no vitals filed for this visit.      Subjective Assessment - 06/20/16 1431    Subjective (p) Patient is having back pain 3/-4/10 and it 06/10/16   Pertinent History (p)  patients clinical presentation is evolving, rotator cuff treatment at another clinic, low back pain 10/ 15    Limitations (p) Sitting;Lifting;Standing;Walking   How long can you sit comfortably? (p) 15 minutes   How long can you stand comfortably? (p) 30 minutes   How long can you walk comfortably? (p) 5- to 10 minutes   Diagnostic tests (p) no   Patient Stated Goals (p) to be able to walk better, lift and carry   Pain Onset (p) In the past 7 days        Manual therapy PA glides , grade 3 L 1- L5 and S1 30 bouts STM to SI to right paraspinal muscles Prone extension X 10   E-stim IFC to right paraspinal musculature x 20 minutes crossed pattern:  Patient has 3-/10 prior to therapy and 0/10 following therapy.                          PT Education -  06/20/16 1508    Education provided Yes   Education Details progression of HEP   Person(s) Educated Patient   Methods Explanation   Comprehension Verbalized understanding             PT Long Term Goals - 06/13/16 1327    PT LONG TERM GOAL #1   Title Patient will be independent in home exercise program to improve strength/mobility for better functional independence with ADLs.   Time 8   Period Weeks   Status New   PT LONG TERM GOAL #2   Title . Patient will increase 10 meter walk test to >1.30m/s as to improve gait speed for better community ambulation and to reduce fall risk   Time 8   Period Weeks   Status New   PT LONG TERM GOAL #3   Title Patient will be able to perform household work/ chores without increase in symptoms.   Time 8   Period Weeks   Status New   PT LONG TERM GOAL #4   Title Patient will report a worst pain of 3/10 on VAS in     back  to improve tolerance with ADLs and reduced symptoms with activities.    Time 8   Period Weeks   Status New               Plan - 06/20/16 1509    Clinical Impression Statement Patient has decreased pain to low back that responds to manual therapy.    Rehab Potential Good   PT Frequency 2x / week   PT Duration 8 weeks   PT Treatment/Interventions Therapeutic exercise;Manual techniques   PT Next Visit Plan manual therapy   Consulted and Agree with Plan of Care Patient      Patient will benefit from skilled therapeutic intervention in order to improve the following deficits and impairments:  Abnormal gait, Pain, Decreased mobility, Decreased activity tolerance, Decreased range of motion, Decreased strength, Difficulty walking  Visit Diagnosis: Right-sided low back pain with right-sided sciatica     Problem List Patient Active Problem List   Diagnosis Date Noted  . Low back pain 06/11/2016  . Osteopenia 09/30/2013  . Encounter for routine gynecological examination 01/18/2012  . Routine general  medical examination at a health care facility 01/09/2012  . [place], [place] 02/06/2010  . DEGENERATIVE JOINT DISEASE, MILD 07/19/2009  Alanson Puls, PT, DPT  [place], Minette Headland S 06/20/2016, 3:15 PM  [place] MAIN [facility] SERVICES [address] [place], [place], [address] Phone: [phone]   Fax:  [phone]  Name: Bridget [name] MRN: [id number] Date of Birth: Dec 08, 1956

## 2016-06-27 ENCOUNTER — Encounter: Payer: Self-pay | Admitting: Physical Therapy

## 2016-06-27 ENCOUNTER — Ambulatory Visit: Payer: 59 | Attending: Family Medicine | Admitting: Physical Therapy

## 2016-06-27 DIAGNOSIS — M25511 Pain in right shoulder: Secondary | ICD-10-CM | POA: Diagnosis not present

## 2016-06-27 DIAGNOSIS — M5441 Lumbago with sciatica, right side: Secondary | ICD-10-CM | POA: Diagnosis not present

## 2016-06-27 DIAGNOSIS — R293 Abnormal posture: Secondary | ICD-10-CM | POA: Insufficient documentation

## 2016-06-27 NOTE — Therapy (Signed)
[place] MAIN [facility] SERVICES [address] [place], [place], [address] Phone: [phone]   Fax:  [phone]  Physical Therapy Treatment  Patient Details  Name: Bridget [name] MRN: [id number] Date of Birth: August 12, 1956 Referring Provider: Dr. Glori Bickers  Encounter Date: 06/27/2016      PT End of Session - 06/27/16 0400    Visit Number 4   Number of Visits 17   Date for PT Re-Evaluation 08/08/16   PT Start Time 0530   PT Stop Time 0610   PT Time Calculation (min) 40 min   Activity Tolerance Patient limited by pain   Behavior During Therapy [facility] for tasks assessed/performed      Past Medical History  Diagnosis Date  . 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 Chronic back pain     Past Surgical History  Procedure Laterality Date  . Ovarian cyst surgery  1973  . Cesarean section  1984 and 1986    hodp pre term labor CS  . Tubal ligation  1999  . Ankle surgery Right     There were no vitals filed for this visit.      Subjective Assessment - 06/27/16 1736    Subjective Patient is having back pain 210 .   Pertinent History  patients clinical presentation is evolving, rotator cuff treatment at another clinic, low back pain 10/ 15    Limitations Sitting;Lifting;Standing;Walking   How long can you sit comfortably? 15 minutes   How long can you stand comfortably? 30 minutes   How long can you walk comfortably? 5- to 10 minutes   Diagnostic tests no   Patient Stated Goals to be able to walk better, lift and carry   Pain Onset In the past 7 days     Manual therapy; PA glides, grade 3 L1- L5 x 30 bouts Long axis distraction to LLE x 30 bouts x 3 Frog RLE prone extension x 10 STM to left paraspinal muscles and left SI joint Hip flex stretch x 30 sec  Core strengthening with TA and hip abd/ER , marching and heel raises Patient has 2/10 pain in R low back and R SI and has 2/10 pain in low back                               PT Education - 06/27/16 1737    Education provided Yes   Education Details prone extension   Person(s) Educated Patient   Methods Explanation   Comprehension Verbalized understanding             PT Long Term Goals - 06/13/16 1327    PT LONG TERM GOAL #1   Title Patient will be independent in home exercise program to improve strength/mobility for better functional independence with ADLs.   Time 8   Period Weeks   Status New   PT LONG TERM GOAL #2   Title . Patient will increase 10 meter walk test to >1.4m/s as to improve gait speed for better community ambulation and to reduce fall risk   Time 8   Period Weeks   Status New   PT LONG TERM GOAL #3   Title Patient will be able to perform household work/ chores without increase in symptoms.   Time 8   Period Weeks   Status New   PT LONG TERM GOAL #4   Title Patient will report a  worst pain of 3/10 on VAS in     back        to improve tolerance with ADLs and reduced symptoms with activities.    Time 8   Period Weeks   Status New               Plan - 06/27/16 1748    Clinical Impression Statement Patient responds to manual therapy and has less pain following treatment. Patient performed core strengthening with TA contractoin.    Rehab Potential Good   PT Frequency 2x / week   PT Duration 8 weeks   PT Treatment/Interventions Therapeutic exercise;Manual techniques   PT Next Visit Plan manual therapy   Consulted and Agree with Plan of Care Patient      Patient will benefit from skilled therapeutic intervention in order to improve the following deficits and impairments:  Abnormal gait, Pain, Decreased mobility, Decreased activity tolerance, Decreased range of motion, Decreased strength, Difficulty walking  Visit Diagnosis: Right-sided low back pain with right-sided sciatica     Problem List Patient Active Problem List   Diagnosis Date Noted  . Low back  pain 06/11/2016  . Osteopenia 09/30/2013  . Encounter for routine gynecological examination 01/18/2012  . Routine general medical examination at a health care facility 01/09/2012  . [place], [place] 02/06/2010  . DEGENERATIVE JOINT DISEASE, MILD 07/19/2009  Alanson Puls, PT, DPT  Arelia Sneddon S 06/27/2016, 5:55 PM  [place] MAIN [facility] SERVICES [address] [place], [place], [address] Phone: [phone]   Fax:  [phone]  Name: Bridget [name] MRN: [id number] Date of Birth: [birth year]

## 2016-06-28 ENCOUNTER — Ambulatory Visit: Payer: 59

## 2016-06-28 DIAGNOSIS — R293 Abnormal posture: Secondary | ICD-10-CM

## 2016-06-28 DIAGNOSIS — M25511 Pain in right shoulder: Secondary | ICD-10-CM | POA: Diagnosis not present

## 2016-06-28 DIAGNOSIS — M5441 Lumbago with sciatica, right side: Secondary | ICD-10-CM | POA: Diagnosis not present

## 2016-06-28 NOTE — Patient Instructions (Signed)
[email or web address] Low rows with RTB, 2x10, 2x/day scap retraction with RTB, 2x10, 2x/day sidelying shoulder ER, 2x10, 2x/day Biceps stretch in door, 3x30-60 sec hold, 2x/day Seated thoracic extension over towel roll; 3x10, 5-10 sec holds, 2x/day Lumbar roll when sitting

## 2016-06-28 NOTE — Therapy (Addendum)
[place] MAIN [facility] SERVICES [address] [place], [place], [address] Phone: [phone]   Fax:  [phone]  Physical Therapy Evaluation  Patient Details  Name: Bridget [name] MRN: [id number] Date of Birth: [birth year] Referring Provider: Dr. Glori Bickers  Encounter Date: 06/28/2016      PT End of Session - 06/28/16 0917    Visit Number 5   Number of Visits 33   Date for PT Re-Evaluation 08/08/16   Authorization Type visit 1 of shoulder   PT Start Time 0805   PT Stop Time 0900   PT Time Calculation (min) 55 min   Activity Tolerance Patient limited by pain   Behavior During Therapy [facility] for tasks assessed/performed      Past Medical History  Diagnosis Date  . 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 Chronic back pain     Past Surgical History  Procedure Laterality Date  . Ovarian cyst surgery  1973  . Cesarean section  1984 and 1986    hodp pre term labor CS  . Tubal ligation  1999  . Ankle surgery Right     There were no vitals filed for this visit.       Subjective Assessment - 06/28/16 0814    Subjective pt c/o R shoulder pain around deltoid/RC insertion point after lifting a heavy object into her car back in May. She denies feeling a pop during the lift. She denies that it has affected her work duties but that she has to use LUE for handling heavy objects (i.e. milk). pt denies N/T down RUE and denies neck issues. Self-massage it makes it feel better. She had x-rays taken and as far as she knows everything looked fine. pt is R hand dominant.   Limitations Lifting   Patient Stated Goals no pain   Currently in Pain? Yes   Pain Score 4    Pain Location Shoulder   Pain Orientation Right   Pain Descriptors / Indicators Aching   Pain Onset More than a month ago   Aggravating Factors  moving, lifting   Pain Relieving Factors self-massage       REFLEXES Not tested  SENSATION Not tested  AROM WNL throughout bil UE     STRENGTH (on 0-5/5 scale)  Left Right  Shoulder flexion 4+ 4 (pain)  Shoulder extension    Shoulder abduction 5 4 (pain)  Shoulder IR/ER 5/5 5/4- (pain)  Elbow flexion  5  Elbow extension  4  Lower trap 4+ 4-  Wrist extension    Supination    Pronation      PALPATION Increased soft tissue restrictions of R deltoid, biceps, long head biceps tendon; mild increase restrictions of R supraspinatus and infraspinatus T spine mobility WNL but painful CERVICAL SPINE AROM: WNL and painless in all directions  SPECIAL TESTS Bear hug: - Drop arm: - ER lag sign: - Lift off: - Painful arc (+)    POSTURE increased thoracic kyphosis, forward head posture, rounded shoulders      PT Education - 06/28/16 0917    Education provided Yes   Education Details exam findings, POC, HEP   Person(s) Educated Patient   Methods Explanation   Comprehension Verbalized understanding             PT Long Term Goals - 06/28/16 0933    PT LONG TERM GOAL #1   Title Patient will be independent in home exercise program to improve strength/mobility for  better functional independence with ADLs.   Time 8   Period Weeks   Status New   PT LONG TERM GOAL #2   Title . Patient will increase 10 meter walk test to >1.71m/s as to improve gait speed for better community ambulation and to reduce fall risk   Time 8   Period Weeks   Status New   PT LONG TERM GOAL #3   Title Patient will be able to perform household work/ chores without increase in symptoms.   Time 8   Period Weeks   Status New   PT LONG TERM GOAL #4   Title Patient will report a worst pain of 3/10 on VAS in     back        to improve tolerance with ADLs and reduced symptoms with activities.    Time 8   Period Weeks   Status New   PT LONG TERM GOAL #5   Title pt will have improved QuickDash Score >11% to demonstrate decreased pain and improved RUE function.   Baseline 29.5% (06/28/16)   Time 8   Period Weeks   Status New    Additional Long Term Goals   Additional Long Term Goals Yes   PT LONG TERM GOAL #6   Title pt will be able to lift a gallon of milk out of her fridge with <3/10 pain to demonstrate improved function and pain.   Time 8   Period Weeks   Status New   PT LONG TERM GOAL #7   Title pt will be independent with HEP to improve overall function, decrease pain and risk of re-injury.   Time 8   Period Weeks   Status New               Plan - 06/28/16 [id number]    Clinical Impression Statement Pt has been seen in therapy for low back pain but also has c/o R shoulder pain. SPT evaluated pt for this today. Pt had full ROM but demonstrated painful arc during flexion and abduction, pain to palpation of RC insertion, deltoid, long head of biceps tendon, and biceps muscle belly. Pt also had increased soft tissue restrictions to deltoid, long head biceps tendon, and biceps muscle belly. Pt had decreased strength in shoulder flexion, abduction, ER, and weak scapular stabilizing muscles. RC special tests were negative. In addition to low back pain, pt demonstrates s/s consistent with R shoulder impingement syndrome/biceps tendinitis. Due to exam findings, pt needs skilled PT intervention to address deficits in order to maximize overall function.   Rehab Potential Good   PT Frequency 2x / week   PT Duration 8 weeks   PT Treatment/Interventions Therapeutic exercise;Manual techniques;Cryotherapy;Iontophoresis 4mg /ml Dexamethasone;Electrical Stimulation;Neuromuscular re-education;Patient/family education;Dry needling;Taping   PT Next Visit Plan manual therapy   Consulted and Agree with Plan of Care Patient      Patient will benefit from skilled therapeutic intervention in order to improve the following deficits and impairments:  Abnormal gait, Pain, Decreased mobility, Decreased activity tolerance, Decreased range of motion, Decreased strength, Difficulty walking, Increased muscle spasms, Postural dysfunction,  Hypomobility, Impaired UE functional use  Visit Diagnosis: Pain in right shoulder  Abnormal posture     Problem List Patient Active Problem List   Diagnosis Date Noted  . Low back pain 06/11/2016  . Osteopenia 09/30/2013  . Encounter for routine gynecological examination 01/18/2012  . Routine general medical examination at a health care facility 01/09/2012  . [place], [place] 02/06/2010  . DEGENERATIVE JOINT  DISEASE, MILD 07/19/2009   Gorden Harms. Tortorici, PT, DPT [phone]  Tortorici,Ashley 06/28/2016, 2:04 PM  [place] MAIN [facility] SERVICES [address] [place], [place], [address] Phone: [phone]   Fax:  [phone]  Name: Bridget [name] MRN: [id number] Date of Birth: [birth year]

## 2016-07-11 ENCOUNTER — Ambulatory Visit: Payer: 59 | Admitting: Physical Therapy

## 2016-07-11 ENCOUNTER — Encounter: Payer: Self-pay | Admitting: Physical Therapy

## 2016-07-11 DIAGNOSIS — M5441 Lumbago with sciatica, right side: Secondary | ICD-10-CM | POA: Diagnosis not present

## 2016-07-11 DIAGNOSIS — R293 Abnormal posture: Secondary | ICD-10-CM | POA: Diagnosis not present

## 2016-07-11 DIAGNOSIS — M25511 Pain in right shoulder: Secondary | ICD-10-CM | POA: Diagnosis not present

## 2016-07-11 NOTE — Therapy (Signed)
[place] [facility] MAIN [facility] SERVICES [address] [place], [place], [address] Phone: [phone]   Fax:  [phone]  Physical Therapy Treatment  Patient Details  Name: Bridget [name] MRN: [id number] Date of Birth: 03-Jan-1956 Referring Provider: Dr. Milinda Antis  Encounter Date: 07/11/2016      PT End of Session - 07/11/16 1619    Visit Number 6   Number of Visits 33   Date for PT Re-Evaluation 08/08/16   Authorization Type visit 1 of shoulder/ 5 visits of back   PT Start Time 0400   PT Stop Time 0445   PT Time Calculation (min) 45 min   Activity Tolerance Patient limited by pain   Behavior During Therapy [facility] for tasks assessed/performed      Past Medical History  Diagnosis Date  . 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 Chronic back pain     Past Surgical History  Procedure Laterality Date  . Ovarian cyst surgery  1973  . Cesarean section  1984 and 1986    hodp pre term labor CS  . Tubal ligation  1999  . Ankle surgery Right     There were no vitals filed for this visit.      Subjective Assessment - 07/11/16 1606    Subjective Patient is having back pain 410 .   Pertinent History  patients clinical presentation is evolving, rotator cuff treatment at another clinic, low back pain 10/ 15    Limitations Sitting;Lifting;Standing;Walking   How long can you sit comfortably? 15 minutes   How long can you stand comfortably? 30 minutes   How long can you walk comfortably? 5- to 10 minutes   Diagnostic tests no   Patient Stated Goals to be able to walk better, lift and carry   Currently in Pain? Yes   Pain Score 4    Pain Location Back   Pain Descriptors / Indicators Aching   Pain Onset In the past 7 days       Manual therpay: PA glides grade 3 to L1-L 5 and long axis distraction to RLE   Therapeutic exercise: Hooklying with TA contraction and marching x 20 x 2, hip abd/ER x 20 x 2, heel slides x 20 x 2  Theraball seated with TA  and marching, hip flex, and trunk rotation x 20 x 2  Patient was instructed in over the chair extension with foam roller Patient continues to have 3/10 back pain following therapy.                           PT Education - 07/11/16 1618    Education provided Yes   Education Details prone extension and stabilization exercises   Person(s) Educated Patient   Methods Explanation   Comprehension Verbalized understanding             PT Long Term Goals - 07/11/16 1622    PT LONG TERM GOAL #1   Title Patient will be independent in home exercise program to improve strength/mobility for better functional independence with ADLs.   Status Achieved   PT LONG TERM GOAL #2   Title . Patient will increase 10 meter walk test to >1.83m/s as to improve gait speed for better community ambulation and to reduce fall risk   Baseline 1.11   Status Partially Met   PT LONG TERM GOAL #3   Title Patient will be able to perform household  work/ chores without increase in symptoms.   Status Partially Met   PT LONG TERM GOAL #4   Title Patient will report a worst pain of 3/10 on VAS in     back        to improve tolerance with ADLs and reduced symptoms with activities.    Status Partially Met   PT LONG TERM GOAL #5   Status On-going               Plan - 07/11/16 1620    Clinical Impression Statement Patient continues to have intermittent pain that ranges from 2/10 to 4/10. She has not been consistent with her exercises and also has been helping tend to her new grandson. who is 15 weeks old.    Rehab Potential Good   PT Frequency 2x / week   PT Duration 8 weeks   PT Treatment/Interventions Therapeutic exercise;Manual techniques;Cryotherapy;Iontophoresis 18m/ml Dexamethasone;Electrical Stimulation;Neuromuscular re-education;Patient/family education;Dry needling;Taping   PT Next Visit Plan manual therapy   Consulted and Agree with Plan of Care Patient      Patient will benefit  from skilled therapeutic intervention in order to improve the following deficits and impairments:  Abnormal gait, Pain, Decreased mobility, Decreased activity tolerance, Decreased range of motion, Decreased strength, Difficulty walking, Increased muscle spasms, Postural dysfunction, Hypomobility, Impaired UE functional use  Visit Diagnosis: Right-sided low back pain with right-sided sciatica     Problem List Patient Active Problem List   Diagnosis Date Noted  . Low back pain 06/11/2016  . Osteopenia 09/30/2013  . Encounter for routine gynecological examination 01/18/2012  . Routine general medical examination at a health care facility 01/09/2012  . F[place]02/14/2011  . DEGENERATIVE JOINT DISEASE, MILD 07/19/2009   KAlanson Puls PT, DPT MNorth HaverhillS 07/11/2016, 4:50 PM  CDerbyMAIN RIowa City Ambulatory Surgical Center LLCSERVICES [address] StreetRYoungsville [place] [address]Phone: [phone]  Fax:  [phone] Name: Bridget BASILIOMRN: [id number] of Birth: 12/09/1956-11-03

## 2016-07-18 ENCOUNTER — Encounter: Payer: Self-pay | Admitting: Physical Therapy

## 2016-07-18 ENCOUNTER — Ambulatory Visit: Payer: 59 | Admitting: Physical Therapy

## 2016-07-18 DIAGNOSIS — R293 Abnormal posture: Secondary | ICD-10-CM | POA: Diagnosis not present

## 2016-07-18 DIAGNOSIS — M5441 Lumbago with sciatica, right side: Secondary | ICD-10-CM

## 2016-07-18 DIAGNOSIS — M25511 Pain in right shoulder: Secondary | ICD-10-CM | POA: Diagnosis not present

## 2016-07-18 NOTE — Therapy (Signed)
[place] MAIN [facility] SERVICES [address] [place], [place], [address] Phone: [phone]   Fax:  [phone]  Physical Therapy Treatment  Patient Details  Name: Bridget [name] MRN: [id number] Date of Birth: 01/14/1956 Referring Provider: Dr. Glori Bickers  Encounter Date: 07/18/2016      PT End of Session - 07/18/16 1642    Visit Number 2   Number of Visits 33   Date for PT Re-Evaluation 08/08/16   Authorization Type visit 1 of shoulder/ 5 visits of back   PT Start Time 0400   PT Stop Time 0445   PT Time Calculation (min) 45 min   Activity Tolerance Patient limited by pain   Behavior During Therapy [facility] for tasks assessed/performed      Past Medical History:  Diagnosis Date  . 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 Chronic back pain     Past Surgical History:  Procedure Laterality Date  . ANKLE SURGERY Right   . CESAREAN SECTION  1984 and 1986   hodp pre term labor CS  . OVARIAN CYST SURGERY  1973  . TUBAL LIGATION  1999    There were no vitals filed for this visit.      Subjective Assessment - 07/18/16 1640    Subjective Patient is having right shoulder pain 6/10 .   Pertinent History  patients clinical presentation is evolving, rotator cuff treatment at another clinic, low back pain 10/ 15    Limitations Sitting;Lifting;Standing;Walking   How long can you sit comfortably? 15 minutes   How long can you stand comfortably? 30 minutes   How long can you walk comfortably? 5- to 10 minutes   Diagnostic tests no   Patient Stated Goals to be able to walk better, lift and carry   Pain Onset In the past 7 days     Manual therapy : AP glides grade 3 to T1- T12 x 30 bouts x 3 [place] to right shoulder 1.5 cm squared x 8 minutes  Reviewed HEP of scapula retraction with YTB, scapula protraction wth YTB and sidelying Er with 30 deg abd. Pain decreased from 6/10 to 4/10 following therapy.                            PT Education - 07/18/16 1640    Education provided Yes   Education Details Reviewed HEP for shoulder   Person(s) Educated Patient   Methods Explanation   Comprehension Verbalized understanding             PT Long Term Goals - 07/11/16 1622      PT LONG TERM GOAL #1   Title Patient will be independent in home exercise program to improve strength/mobility for better functional independence with ADLs.   Status Achieved     PT LONG TERM GOAL #2   Title . Patient will increase 10 meter walk test to >1.36ms as to improve gait speed for better community ambulation and to reduce fall risk   Baseline 1.11   Status Partially Met     PT LONG TERM GOAL #3   Title Patient will be able to perform household work/ chores without increase in symptoms.   Status Partially Met     PT LONG TERM GOAL #4   Title Patient will report a worst pain of 3/10 on VAS in     back        to improve  tolerance with ADLs and reduced symptoms with activities.    Status Partially Met     PT LONG TERM GOAL #5   Status On-going               Plan - 07/18/16 1644    Clinical Impression Statement Patient has 6/10 pain to shoulder and it decreases to 4/10 right shoulder pain wiht [place] and thoracic manual therapy. She was instructed  in scapula protraction, scapula retraction, and sidelying ER with 30 deg abd.    Rehab Potential Good   PT Frequency 2x / week   PT Duration 8 weeks   PT Treatment/Interventions Therapeutic exercise;Manual techniques;Cryotherapy;Iontophoresis 39m/ml Dexamethasone;Electrical Stimulation;Neuromuscular re-education;Patient/family education;Dry needling;Taping   PT Next Visit Plan manual therapy   Consulted and Agree with Plan of Care Patient      Patient will benefit from skilled therapeutic intervention in order to improve the following deficits and impairments:  Abnormal gait, Pain, Decreased mobility, Decreased activity tolerance, Decreased range of motion, Decreased  strength, Difficulty walking, Increased muscle spasms, Postural dysfunction, Hypomobility, Impaired UE functional use  Visit Diagnosis: Right-sided low back pain with right-sided sciatica  Pain in right shoulder  Abnormal posture     Problem List Patient Active Problem List   Diagnosis Date Noted  . Low back pain 06/11/2016  . Osteopenia 09/30/2013  . Encounter for routine gynecological examination 01/18/2012  . Routine general medical examination at a health care facility 01/09/2012  . F[place]02/14/2011  . DEGENERATIVE JOINT DISEASE, MILD 07/19/2009  KAlanson Puls PT, DPT  MVancleaveS 07/18/2016, 4:50 PM  CAlhambraMAIN RCorrect Care Of South CarolinaSERVICES [address] County CourtRMount Healthy NAl[place] [address]Phone: [phone]  Fax:  [phone] Name: MDONNI OGLESBYMRN: [id number] of Birth: 812-12-1955

## 2016-07-25 ENCOUNTER — Ambulatory Visit: Payer: 59 | Attending: Family Medicine

## 2016-07-25 DIAGNOSIS — R293 Abnormal posture: Secondary | ICD-10-CM | POA: Insufficient documentation

## 2016-07-25 DIAGNOSIS — M5441 Lumbago with sciatica, right side: Secondary | ICD-10-CM | POA: Insufficient documentation

## 2016-07-25 DIAGNOSIS — M25511 Pain in right shoulder: Secondary | ICD-10-CM | POA: Insufficient documentation

## 2016-07-25 NOTE — Patient Instructions (Signed)
[email or web address] Low row red band 2 x 10 sidelying ER 2 x 10 Biceps stretch 30s x 2 Repeated T spine ext in sitting x 10

## 2016-07-30 ENCOUNTER — Ambulatory Visit: Payer: 59 | Admitting: Physical Therapy

## 2016-07-30 ENCOUNTER — Encounter: Payer: Self-pay | Admitting: Physical Therapy

## 2016-07-30 DIAGNOSIS — R293 Abnormal posture: Secondary | ICD-10-CM | POA: Diagnosis not present

## 2016-07-30 DIAGNOSIS — M25511 Pain in right shoulder: Secondary | ICD-10-CM | POA: Diagnosis not present

## 2016-07-30 DIAGNOSIS — M5441 Lumbago with sciatica, right side: Secondary | ICD-10-CM | POA: Diagnosis not present

## 2016-07-30 NOTE — Therapy (Signed)
[place] MAIN [facility] SERVICES [address] [place], [place], [address] Phone: [phone]   Fax:  [phone]  Physical Therapy Treatment  Patient Details  Name: Bridget [name] MRN: [id number] Date of Birth: 1956-10-14 Referring Provider: Dr. Glori Bickers  Encounter Date: 07/30/2016      PT End of Session - 07/30/16 1621    Visit Number 3   Number of Visits 33   Date for PT Re-Evaluation 08/08/16   Authorization Type visit 1 of shoulder/ 5 visits of back   PT Start Time 0400   PT Stop Time 0445   PT Time Calculation (min) 45 min   Activity Tolerance Patient limited by pain   Behavior During Therapy [facility] for tasks assessed/performed      Past Medical History:  Diagnosis Date  . 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 Chronic back pain     Past Surgical History:  Procedure Laterality Date  . ANKLE SURGERY Right   . CESAREAN SECTION  1984 and 1986   hodp pre term labor CS  . OVARIAN CYST SURGERY  1973  . TUBAL LIGATION  1999    There were no vitals filed for this visit.      Subjective Assessment - 07/30/16 1607    Subjective Patient is having 5/10 to right shoulder and it is pretty much the same as last week.    Pertinent History  patients clinical presentation is evolving, rotator cuff treatment at another clinic, low back pain 10/ 15    Limitations Sitting;Lifting;Standing;Walking   How long can you sit comfortably? 15 minutes   How long can you stand comfortably? 30 minutes   How long can you walk comfortably? 5- to 10 minutes   Diagnostic tests no   Patient Stated Goals to be able to walk better, lift and carry   Currently in Pain? Yes   Pain Score 5    Pain Location Shoulder   Pain Orientation Right   Pain Descriptors / Indicators Tender;Aching   Pain Type Chronic pain   Pain Onset In the past 7 days      E-stim to right shoulder IFC with crossed pattern  X 20 minutes and MH x 20 minutes Patient has tenderness over  infraspinatus muscle and biceps muscle.    Manual therpay: Thoracic spine AP glides grade 3 for T 4- T12 x 30 bouts x 3 sets  Patient has minimal pain relief after e-stim and manual therapy. She is doing her stretching and HEP for RUE daily.                          PT Education - 07/30/16 1610    Education provided Yes   Education Details HEP   Person(s) Educated Patient   Methods Explanation   Comprehension Verbalized understanding             PT Long Term Goals - 07/11/16 1622      PT LONG TERM GOAL #1   Title Patient will be independent in home exercise program to improve strength/mobility for better functional independence with ADLs.   Status Achieved     PT LONG TERM GOAL #2   Title . Patient will increase 10 meter walk test to >1.42ms as to improve gait speed for better community ambulation and to reduce fall risk   Baseline 1.11   Status Partially Met     PT LONG TERM GOAL #3  Title Patient will be able to perform household work/ chores without increase in symptoms.   Status Partially Met     PT LONG TERM GOAL #4   Title Patient will report a worst pain of 3/10 on VAS in     back        to improve tolerance with ADLs and reduced symptoms with activities.    Status Partially Met     PT LONG TERM GOAL #5   Status On-going               Plan - 07/30/16 1622    Clinical Impression Statement Patient ahs 5/10 pain to right shoulder and it gets minimal relief from e-stim and thoracic manual therapy.    Rehab Potential Good   PT Frequency 2x / week   PT Duration 8 weeks   PT Treatment/Interventions Therapeutic exercise;Manual techniques;Cryotherapy;Iontophoresis 15m/ml Dexamethasone;Electrical Stimulation;Neuromuscular re-education;Patient/family education;Dry needling;Taping   PT Next Visit Plan manual therapy   Consulted and Agree with Plan of Care Patient      Patient will benefit from skilled therapeutic intervention in order to  improve the following deficits and impairments:  Abnormal gait, Pain, Decreased mobility, Decreased activity tolerance, Decreased range of motion, Decreased strength, Difficulty walking, Increased muscle spasms, Postural dysfunction, Hypomobility, Impaired UE functional use  Visit Diagnosis: Right-sided low back pain with right-sided sciatica  Pain in right shoulder  Abnormal posture     Problem List Patient Active Problem List   Diagnosis Date Noted  . Low back pain 06/11/2016  . Osteopenia 09/30/2013  . Encounter for routine gynecological examination 01/18/2012  . Routine general medical examination at a health care facility 01/09/2012  . FL[place] PM[place]02/14/2011  . DEGENERATIVE JOINT DISEASE, MILD 07/19/2009   KAlanson Puls PT, DPT MMorgan Hill KMinette HeadlandS 07/30/2016, 4:25 PM  CTrego-Rohrersville StationMAIN RBrighton Surgery Center LLCSERVICES [address] StreetRMerkel N[place] [address]Phone: [phone]  Fax:  [phone] Name: Bridget BECKUMMRN: [id number] of Birth: 803/17/1957

## 2016-08-01 ENCOUNTER — Ambulatory Visit: Payer: 59 | Admitting: Family Medicine

## 2016-08-01 NOTE — Therapy (Signed)
[place] MAIN [facility] SERVICES [address] [place], [place], [address] Phone: [phone]   Fax:  [phone]  Physical Therapy Treatment  Patient Details  Name: Bridget [name] MRN: [id number] Date of Birth: 19-Jun-1956 Referring Provider: Dr. Glori Bickers  Encounter Date: 07/25/2016      PT End of Session - 08/01/16 1026    Visit Number 3   Number of Visits 33   Date for PT Re-Evaluation 08/08/16   Authorization Type visit2 of shoulder/ 5 visits of back   PT Start Time 1530   PT Stop Time 1610   PT Time Calculation (min) 40 min   Activity Tolerance Patient tolerated treatment well   Behavior During Therapy [facility] for tasks assessed/performed      Past Medical History:  Diagnosis Date  . 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 Chronic back pain     Past Surgical History:  Procedure Laterality Date  . ANKLE SURGERY Right   . CESAREAN SECTION  1984 and 1986   hodp pre term labor CS  . OVARIAN CYST SURGERY  1973  . TUBAL LIGATION  1999    There were no vitals filed for this visit.     manual therapy: Soft tissue mobilization to R deltoid and biceps muscle belly Patient received dry needling therapy education and acknowledged understanding of risks and benefits of dry needling therapy prior to receiving treatment. Patient voiced understanding of treatment options and elected to proceed with dry needling therapy.   Trigger point dry needling to R biceps and deltoid Manual biceps stretch 20s x 4 Pt was less tender to palpation following session Therex: Low row red band 2  X 10 sidelying shoulder ER 0# 2 x 10 REIS of thoracic spine over chair x 10 Biceps stretch 30s x 2 on wall Pt requires min-mod verbal and tactile cues for proper exercise performance                                 PT Long Term Goals - 07/11/16 1622      PT LONG TERM GOAL #1   Title Patient will be independent in home exercise program to  improve strength/mobility for better functional independence with ADLs.   Status Achieved     PT LONG TERM GOAL #2   Title . Patient will increase 10 meter walk test to >1.16ms as to improve gait speed for better community ambulation and to reduce fall risk   Baseline 1.11   Status Partially Met     PT LONG TERM GOAL #3   Title Patient will be able to perform household work/ chores without increase in symptoms.   Status Partially Met     PT LONG TERM GOAL #4   Title Patient will report a worst pain of 3/10 on VAS in     back        to improve tolerance with ADLs and reduced symptoms with activities.    Status Partially Met     PT LONG TERM GOAL #5   Status On-going               Plan - 08/01/16 1027    Clinical Impression Statement pt has made little progress regarding her shoulder due to non compliance with HEP which was reinstructed today with new handout. pt also cued to correct posture. pt has multiple biceps and deltoid trigger points  likely due to overuse from periscapular and RTC weakness. pt trigger poitns were addressed with manual therapy today, although pt reported little change in her pain following session.    Rehab Potential Good   PT Frequency 2x / week   PT Duration 8 weeks   PT Treatment/Interventions Therapeutic exercise;Manual techniques;Cryotherapy;Iontophoresis 89m/ml Dexamethasone;Electrical Stimulation;Neuromuscular re-education;Patient/family education;Dry needling;Taping   PT Next Visit Plan manual therapy   Consulted and Agree with Plan of Care Patient      Patient will benefit from skilled therapeutic intervention in order to improve the following deficits and impairments:  Abnormal gait, Pain, Decreased mobility, Decreased activity tolerance, Decreased range of motion, Decreased strength, Difficulty walking, Increased muscle spasms, Postural dysfunction, Hypomobility, Impaired UE functional use  Visit Diagnosis: Pain in right shoulder  Abnormal  posture     Problem List Patient Active Problem List   Diagnosis Date Noted  . Low back pain 06/11/2016  . Osteopenia 09/30/2013  . Encounter for routine gynecological examination 01/18/2012  . Routine general medical examination at a health care facility 01/09/2012  . FGen[place] PGrand [place]02/14/2011  . DEGENERATIVE JOINT DISEASE, MILD 07/19/2009   AGorden Harms Indiya Izquierdo, PT, DPT #[id number] Jashaun Penrose 08/01/2016, 10:35 AM  CLoganMAIN RCarrollton SpringsSERVICES [address]REvening [place] [address]Phone: [phone]  Fax:  [phone] Name: MHAELEY FORDHAMMRN: [id number] of Birth: 804-08-1956

## 2016-08-06 ENCOUNTER — Ambulatory Visit: Payer: 59 | Admitting: Physical Therapy

## 2016-08-06 DIAGNOSIS — M25511 Pain in right shoulder: Secondary | ICD-10-CM | POA: Diagnosis not present

## 2016-08-06 DIAGNOSIS — R293 Abnormal posture: Secondary | ICD-10-CM | POA: Diagnosis not present

## 2016-08-06 DIAGNOSIS — M5441 Lumbago with sciatica, right side: Secondary | ICD-10-CM

## 2016-08-07 ENCOUNTER — Encounter: Payer: Self-pay | Admitting: Physical Therapy

## 2016-08-07 NOTE — Therapy (Signed)
[place] MAIN [facility] SERVICES [address] [place], [place], [address] Phone: [phone]   Fax:  [phone]  Physical Therapy Treatment  Patient Details  Name: Bridget [name] MRN: [id number] Date of Birth: July 31, 1956 Referring Provider: Dr. Glori Bickers  Encounter Date: 08/06/2016      PT End of Session - 08/06/16 1727    Visit Number 4   Number of Visits 33   Date for PT Re-Evaluation 08/08/16   Authorization Type visit 4 of shoulder/ 5 visits of back   PT Start Time 0530   PT Stop Time 0615   PT Time Calculation (min) 45 min   Activity Tolerance Patient tolerated treatment well   Behavior During Therapy [facility] for tasks assessed/performed      Past Medical History:  Diagnosis Date  . 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 Chronic back pain     Past Surgical History:  Procedure Laterality Date  . ANKLE SURGERY Right   . CESAREAN SECTION  1984 and 1986   hodp pre term labor CS  . OVARIAN CYST SURGERY  1973  . TUBAL LIGATION  1999    There were no vitals filed for this visit.      Subjective Assessment - 08/06/16 1726    Subjective Patient is having 5/10 to right shoulder and it is pretty much the same as last week.    Pertinent History  patients clinical presentation is evolving, rotator cuff treatment at another clinic, low back pain 10/ 15    Limitations Sitting;Lifting;Standing;Walking   How long can you sit comfortably? 15 minutes   How long can you stand comfortably? 30 minutes   How long can you walk comfortably? 5- to 10 minutes   Diagnostic tests no   Patient Stated Goals to be able to walk better, lift and carry   Pain Onset In the past 7 days     Manual therapy:  Right shoulder AP glides grade 2 and 3 , inferior glide  Right shoulder.Posterior glide  to right shoulder AROM in supine and AAROM in supine  [place] to right shoulder at 1.5 cm squared x 8 mins.  Patient has no pain to left shoulder following manual  therapy.                             PT Education - 08/06/16 1726    Education Details ice and take anti-infoamatory   Person(s) Educated Patient   Methods Explanation   Comprehension Verbalized understanding             PT Long Term Goals - 07/11/16 1622      PT LONG TERM GOAL #1   Title Patient will be independent in home exercise program to improve strength/mobility for better functional independence with ADLs.   Status Achieved     PT LONG TERM GOAL #2   Title . Patient will increase 10 meter walk test to >1.69ms as to improve gait speed for better community ambulation and to reduce fall risk   Baseline 1.11   Status Partially Met     PT LONG TERM GOAL #3   Title Patient will be able to perform household work/ chores without increase in symptoms.   Status Partially Met     PT LONG TERM GOAL #4   Title Patient will report a worst pain of 3/10 on VAS in     back  to improve tolerance with ADLs and reduced symptoms with activities.    Status Partially Met     PT LONG TERM GOAL #5   Status On-going               Plan - 08/07/16 1728    Clinical Impression Statement Patient has made little progress regarding her shoulder due to non compliance with HEP and also not taking her anti inflamatory medication. She wa instructed to take her medicine and ice her shoulder and perform her exercises.    Rehab Potential Good   PT Frequency 2x / week   PT Duration 8 weeks   PT Treatment/Interventions Therapeutic exercise;Manual techniques;Cryotherapy;Iontophoresis 62m/ml Dexamethasone;Electrical Stimulation;Neuromuscular re-education;Patient/family education;Dry needling;Taping   PT Next Visit Plan manual therapy   Consulted and Agree with Plan of Care Patient      Patient will benefit from skilled therapeutic intervention in order to improve the following deficits and impairments:  Abnormal gait, Pain, Decreased mobility, Decreased activity  tolerance, Decreased range of motion, Decreased strength, Difficulty walking, Increased muscle spasms, Postural dysfunction, Hypomobility, Impaired UE functional use  Visit Diagnosis: Right-sided low back pain with right-sided sciatica  Pain in right shoulder  Abnormal posture     Problem List Patient Active Problem List   Diagnosis Date Noted  . Low back pain 06/11/2016  . Osteopenia 09/30/2013  . Encounter for routine gynecological examination 01/18/2012  . Routine general medical examination at a health care facility 01/09/2012  . [place] PNew [place]02/14/2011  . DEGENERATIVE JOINT DISEASE, MILD 07/19/2009   KAlanson Puls PT, DPT MSmithville Flats KMinette HeadlandS 08/07/2016, 5:30 PM  CNapanochMAIN REndoscopy Center Of North BaltimoreSERVICES [address]Central City [place] [address]Phone: [phone]  Fax:  [phone] Name: MITALY WARRINERMRN: [id number] of Birth: 803-08-1956

## 2016-08-13 ENCOUNTER — Ambulatory Visit: Payer: 59 | Admitting: Physical Therapy

## 2016-08-15 ENCOUNTER — Encounter: Payer: Self-pay | Admitting: Physical Therapy

## 2016-08-15 ENCOUNTER — Ambulatory Visit: Payer: 59 | Admitting: Physical Therapy

## 2016-08-15 DIAGNOSIS — M25511 Pain in right shoulder: Secondary | ICD-10-CM | POA: Diagnosis not present

## 2016-08-15 DIAGNOSIS — C44319 Basal cell carcinoma of skin of other parts of face: Secondary | ICD-10-CM | POA: Diagnosis not present

## 2016-08-15 DIAGNOSIS — R293 Abnormal posture: Secondary | ICD-10-CM | POA: Diagnosis not present

## 2016-08-15 DIAGNOSIS — M5441 Lumbago with sciatica, right side: Secondary | ICD-10-CM

## 2016-08-15 DIAGNOSIS — Z85828 Personal history of other malignant neoplasm of skin: Secondary | ICD-10-CM | POA: Diagnosis not present

## 2016-08-15 DIAGNOSIS — L821 Other seborrheic keratosis: Secondary | ICD-10-CM | POA: Diagnosis not present

## 2016-08-15 DIAGNOSIS — D225 Melanocytic nevi of trunk: Secondary | ICD-10-CM | POA: Diagnosis not present

## 2016-08-15 DIAGNOSIS — D235 Other benign neoplasm of skin of trunk: Secondary | ICD-10-CM | POA: Diagnosis not present

## 2016-08-15 DIAGNOSIS — D485 Neoplasm of uncertain behavior of skin: Secondary | ICD-10-CM | POA: Diagnosis not present

## 2016-08-15 NOTE — Therapy (Signed)
[place] MAIN [facility] SERVICES [address] [place], [place], [address] Phone: [phone]   Fax:  [phone]  Physical Therapy Treatment/ DC Summary  Patient Details  Name: Bridget [name] MRN: [id number] Date of Birth: [birth year] Referring Provider: Dr. Glori Bickers  Encounter Date: 08/15/2016      PT End of Session - 08/15/16 1403    Visit Number 5   Number of Visits 33   Date for PT Re-Evaluation 08/08/16   Authorization Type visit 5 of shoulder/ 5 visits of back   PT Start Time 0145   PT Stop Time 0230   PT Time Calculation (min) 45 min   Activity Tolerance Patient tolerated treatment well   Behavior During Therapy [facility] for tasks assessed/performed      Past Medical History:  Diagnosis Date  . 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 Chronic back pain     Past Surgical History:  Procedure Laterality Date  . ANKLE SURGERY Right   . CESAREAN SECTION  1984 and 1986   hodp pre term labor CS  . OVARIAN CYST SURGERY  1973  . TUBAL LIGATION  1999    There were no vitals filed for this visit.      Subjective Assessment - 08/15/16 1357    Subjective Patient is having 5/10 to right shoulder and it is pretty much the same as last week.    Pertinent History  patients clinical presentation is evolving, rotator cuff treatment at another clinic, low back pain 10/ 15    Limitations Sitting;Lifting;Standing;Walking   How long can you sit comfortably? 15 minutes   How long can you stand comfortably? 30 minutes   How long can you walk comfortably? 5- to 10 minutes   Diagnostic tests no   Patient Stated Goals to be able to walk better, lift and carry   Currently in Pain? Yes   Pain Score 4    Pain Location Shoulder   Pain Orientation Right   Pain Descriptors / Indicators Aching;Tender   Pain Type Acute pain   Pain Onset In the past 7 days      Therapeutic exercise: UBE x 2/12 mins fwd, 21/2 mins bwd Scapula retraction x 20 with  BTB Scapula protraction with BTB x 20 Supine bench press with 4 lbs x 20  Supine cane flex with 4 lbs x 20 Shoulder abd/add in supine  With 4 lbs Rows with 2 lbs x 20  Patient needs occasional verbal cueing to improve posture and cueing to correctly perform exercises slowly, holding at end of range to increase motor firing of desired muscle to encourage fatigue. Reviewed HEP and need to take anti inflammation medication., and ice.                             PT Education - 08/15/16 1402    Education provided Yes   Person(s) Educated Patient   Methods Explanation   Comprehension Verbalized understanding             PT Long Term Goals - 08/15/16 1438      PT LONG TERM GOAL #1   Title Patient will be independent in home exercise program to improve strength/mobility for better functional independence with ADLs.   Time 8   Period Weeks   Status Achieved     PT LONG TERM GOAL #3   Title Patient will be able to perform household  work/ chores without increase in symptoms.   Time 8   Status Not Met     PT LONG TERM GOAL #4   Title Patient will report a worst pain of 3/10 on VAS in     back        to improve tolerance with ADLs and reduced symptoms with activities.    Time 8   Period Weeks   Status Partially Met     PT LONG TERM GOAL #5   Title pt will have improved QuickDash Score >11% to demonstrate decreased pain and improved RUE function.   Time 8   Status Partially Met     PT LONG TERM GOAL #6   Title pt will be able to lift a gallon of milk out of her fridge with <3/10 pain to demonstrate improved function and pain.   Time 8   Status Partially Met     PT LONG TERM GOAL #7   Title pt will be independent with HEP to improve overall function, decrease pain and risk of re-injury.   Time 8   Status Achieved               Plan - 08/15/16 1436    Clinical Impression Statement Patient continues to have increased right shoulder pain during  abduction and IR. She is able to perform exercises in supine wiht light weights and shoulder exercises that are below 90 degrees pain free. She was educated in [place] was discussed due to lack of progress in her right shoulder pain.    Rehab Potential Good   PT Frequency 2x / week   PT Duration 8 weeks   PT Treatment/Interventions Therapeutic exercise;Manual techniques;Cryotherapy;Iontophoresis 44m/ml Dexamethasone;Electrical Stimulation;Neuromuscular re-education;Patient/family education;Dry needling;Taping   PT Next Visit Plan manual therapy   Consulted and Agree with Plan of Care Patient      Patient will benefit from skilled therapeutic intervention in order to improve the following deficits and impairments:  Abnormal gait, Pain, Decreased mobility, Decreased activity tolerance, Decreased range of motion, Decreased strength, Difficulty walking, Increased muscle spasms, Postural dysfunction, Hypomobility, Impaired UE functional use  Visit Diagnosis: Right-sided low back pain with right-sided sciatica  Pain in right shoulder  Abnormal posture     Problem List Patient Active Problem List   Diagnosis Date Noted  . Low back pain 06/11/2016  . Osteopenia 09/30/2013  . Encounter for routine gynecological examination 01/18/2012  . Routine general medical examination at a health care facility 01/09/2012  [place]  . DEGENERATIVE JOINT DISEASE, MILD 07/19/2009  KAlanson Puls PT, DPT [place] KMinette HeadlandS 08/15/2016, 2:47 PM  [facility]MAIN [facility] [address] [address]Phone: [phone]  Fax:  [phone] Name: Bridget HULENMRN: [id number] of Birth: 8April 29, 1957

## 2016-08-22 ENCOUNTER — Encounter: Payer: 59 | Admitting: Physical Therapy

## 2016-09-05 ENCOUNTER — Ambulatory Visit: Payer: 59 | Admitting: Family Medicine

## 2016-09-12 ENCOUNTER — Encounter: Payer: Self-pay | Admitting: Family Medicine

## 2016-09-12 ENCOUNTER — Other Ambulatory Visit: Payer: Self-pay | Admitting: Family Medicine

## 2016-09-12 ENCOUNTER — Ambulatory Visit (INDEPENDENT_AMBULATORY_CARE_PROVIDER_SITE_OTHER): Payer: 59 | Admitting: Family Medicine

## 2016-09-12 VITALS — BP 94/64 | HR 81 | Temp 98.4°F | Ht 62.75 in | Wt 155.8 lb

## 2016-09-12 DIAGNOSIS — M75111 Incomplete rotator cuff tear or rupture of right shoulder, not specified as traumatic: Secondary | ICD-10-CM | POA: Diagnosis not present

## 2016-09-12 DIAGNOSIS — Z1231 Encounter for screening mammogram for malignant neoplasm of breast: Secondary | ICD-10-CM

## 2016-09-12 DIAGNOSIS — M25511 Pain in right shoulder: Secondary | ICD-10-CM

## 2016-09-12 MED ORDER — DIAZEPAM 2 MG PO TABS: ORAL_TABLET | ORAL | 0 refills | Status: DC

## 2016-09-12 NOTE — Progress Notes (Signed)
Dr. Frederico Hamman T. Dacey Milberger, MD, [facility] [employer] [place], [address] Phone: [phone] Fax: [phone]  09/12/2016  Patient: Bridget [name], MRN: [id number], DOB: June 10, 1956, 60 y.o.  Primary Physician:  Loura Pardon, MD   Chief Complaint  Patient presents with  . Follow-up    Right Shoulder   Subjective:   3 month f/u: R shoulder  Shoulder is not doing any better at all.  Abduction is painful. Pain with abduction and weakness has not improved at all with PT Has been doing anti-inflammatories, also.   Also, when opening the door - L arm with some pain.   06/15/2016 Last OV with Owens Loffler, MD  This 60 y.o. female patient noted above presents with shoulder pain that has been ongoing for 1 mo there is no history of trauma or accident recently, but she did have some shoulder pain after she was lifting some water approximately at the time of onset. The patient denies neck pain or radicular symptoms. Denies dislocation, subluxation, separation of the shoulder. The patient does complain of pain in the overhead plane with significant painful arc of motion.  R shoulder pain anteriorly and laterally. About 1 month ago - thinks when lifting some water.  No surgery.   RHD  Med technologist - .   Medications Tried: Tylenol, NSAIDS Ice or Heat: minimally helpful Tried PT: No  Prior shoulder Injury: No Prior surgery: No Prior fracture: No  The PMH, PSH, Social History, Family History, Medications, and allergies have been reviewed in [facility], and have been updated if relevant.  Patient Active Problem List   Diagnosis Date Noted  . Low back pain 06/11/2016  . Osteopenia 09/30/2013  . Encounter for routine gynecological examination 01/18/2012  . Routine general medical examination at a health care facility 01/09/2012  . [place], [place] 02/06/2010  . DEGENERATIVE JOINT DISEASE, MILD 07/19/2009    Past  Medical History:  Diagnosis Date  . 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 Chronic back pain     Past Surgical History:  Procedure Laterality Date  . ANKLE SURGERY Right   . CESAREAN SECTION  1984 and 1986   hodp pre term labor CS  . OVARIAN CYST SURGERY  1973  . TUBAL LIGATION  1999    Social History   Social History  . Marital status: Married    Spouse name: N/A  . Number of children: N/A  . Years of education: N/A   Occupational History  . Not on file.   Social History Main Topics  . Smoking status: Never Smoker  . Smokeless tobacco: Never Used  . Alcohol use 0.0 oz/week     Comment: 2-3 nights per week  . Drug use: No  . Sexual activity: Not on file   Other Topics Concern  . Not on file   Social History Narrative  . No narrative on file    Family History  Problem Relation Age of Onset  . Arthritis Mother     OA with verterabral fx  . Hypertension Mother   . Fibrocystic breast disease Mother   . Cancer Maternal Grandfather     colon CA  . Diabetes Paternal Grandmother     Allergies  Allergen Reactions  . Lorcet [Hydrocodone-Acetaminophen] Rash    Medication list reviewed and updated in full in [place].  GEN: No fevers, chills. Nontoxic. Primarily MSK c/o today. MSK: Detailed in the HPI  GI: tolerating PO intake without difficulty Neuro: No numbness, parasthesias, or tingling associated. Otherwise the pertinent positives of the ROS are noted above.   Objective:   Blood pressure 94/64, pulse 81, temperature 98.4 F (36.9 C), temperature source Oral, height 5' 2.75" (1.594 m), weight 155 lb 12 oz (70.6 kg).  GEN: Well-developed,well-nourished,in no acute distress; alert,appropriate and cooperative throughout examination HEENT: Normocephalic and atraumatic without obvious abnormalities. Ears, externally no deformities PULM: Breathing comfortably in no respiratory distress EXT: No clubbing, cyanosis, or edema PSYCH: Normally interactive.  Cooperative during the interview. Pleasant. Friendly and conversant. Not anxious or depressed appearing. Normal, full affect.  Shoulder: R Inspection: No muscle wasting or winging Ecchymosis/edema: neg  AC joint, scapula, clavicle: NT Cervical spine: NT, full ROM Spurling's: neg Abduction: full, 4/5, painful arc of motion Flexion: full, 5/5 IR, full, lift-off: 5/5 ER at neutral: full, 5/5 AC crossover: neg Neer: neg Hawkins: pos Drop Test: neg Empty Can: pos Supraspinatus insertion: mild-mod T Bicipital groove: NT Speed's: neg Yergason's: neg Sulcus sign: neg Scapular dyskinesis: yes, mild with abd C5-T1 intact  Neuro: Sensation intact Grip 5/5   Radiology: Dg Shoulder Right  Result Date: 06/15/2016 CLINICAL DATA:  Right shoulder pain. EXAM: RIGHT SHOULDER - 2+ VIEW COMPARISON:  None. FINDINGS: There is no evidence of fracture or dislocation. There is no evidence of arthropathy or other focal bone abnormality. Soft tissues are unremarkable. IMPRESSION: Negative. Electronically Signed   By: Rolm Baptise M.D.   On: 06/15/2016 10:58   Assessment and Plan:    Right shoulder pain - Plan: MR Shoulder Right Wo Contrast, diazepam (VALIUM) 2 MG tablet, DISCONTINUED: diazepam (VALIUM) 2 MG tablet  Partial tear of rotator cuff, right - Plan: MR Shoulder Right Wo Contrast, diazepam (VALIUM) 2 MG tablet, DISCONTINUED: diazepam (VALIUM) 2 MG tablet  Failure to improve with 4 months of conservative measures including NSAIDS and formal PT, HEP. Obtain an MRI of the R shoulder to evaluate for RTC tear - concern for supraspinatus tear.  Follow-up: No Follow-up on file.  New Prescriptions   DIAZEPAM (VALIUM) 2 MG TABLET    1 po 45 mins before MRI   Orders Placed This Encounter  Procedures  . MR Shoulder Right Wo Contrast    Signed,  Lashara Urey T. Eilam Sh[place]sbury, MD   Patient's Medications  New Prescriptions   DIAZEPAM (VALIUM) 2 MG TABLET    1 po 45 mins before MRI  Previous Medications    IBUPROFEN (ADVIL,MOTRIN) 200 MG TABLET    Take 200 mg by mouth every 6 (six) hours as needed.   MELOXICAM (MOBIC) 15 MG TABLET    Take 1 tablet (15 mg total) by mouth daily as needed for pain (take with food).  Modified Medications   No medications on file  Discontinued Medications   No medications on file

## 2016-09-12 NOTE — Progress Notes (Signed)
Pre visit review using our clinic review tool, if applicable. No additional management support is needed unless otherwise documented below in the visit note. 

## 2016-09-12 NOTE — Patient Instructions (Signed)

## 2016-09-17 MED FILL — diazePAM 2 MG TABS: 2 | 1 days supply | Qty: 1 | Fill #0

## 2016-09-19 ENCOUNTER — Ambulatory Visit (HOSPITAL_COMMUNITY)
Admission: RE | Admit: 2016-09-19 | Discharge: 2016-09-19 | Disposition: A | Discharge status: Home / Self Care | Payer: 59 | Source: Ambulatory Visit | Attending: Family Medicine | Admitting: Family Medicine

## 2016-09-19 DIAGNOSIS — M75111 Incomplete rotator cuff tear or rupture of right shoulder, not specified as traumatic: Secondary | ICD-10-CM | POA: Diagnosis not present

## 2016-09-19 DIAGNOSIS — M25511 Pain in right shoulder: Secondary | ICD-10-CM

## 2016-09-19 DIAGNOSIS — M7551 Bursitis of right shoulder: Secondary | ICD-10-CM | POA: Diagnosis not present

## 2016-09-19 DIAGNOSIS — M7591 Shoulder lesion, unspecified, right shoulder: Secondary | ICD-10-CM | POA: Diagnosis not present

## 2016-09-19 DIAGNOSIS — M7581 Other shoulder lesions, right shoulder: Secondary | ICD-10-CM | POA: Insufficient documentation

## 2016-09-20 ENCOUNTER — Other Ambulatory Visit: Payer: Self-pay | Admitting: Family Medicine

## 2016-09-20 DIAGNOSIS — M75101 Unspecified rotator cuff tear or rupture of right shoulder, not specified as traumatic: Secondary | ICD-10-CM

## 2016-09-20 DIAGNOSIS — M25511 Pain in right shoulder: Secondary | ICD-10-CM

## 2016-09-20 NOTE — Progress Notes (Signed)
Consult Dr. Rolm Gala for R RTC tear.

## 2016-09-24 ENCOUNTER — Ambulatory Visit
Admission: RE | Admit: 2016-09-24 | Discharge: 2016-09-24 | Disposition: A | Discharge status: Home / Self Care | Payer: 59 | Source: Ambulatory Visit | Attending: Family Medicine | Admitting: Family Medicine

## 2016-09-24 DIAGNOSIS — Z1231 Encounter for screening mammogram for malignant neoplasm of breast: Secondary | ICD-10-CM | POA: Diagnosis not present

## 2016-10-03 DIAGNOSIS — M65811 Other synovitis and tenosynovitis, right shoulder: Secondary | ICD-10-CM | POA: Diagnosis not present

## 2016-10-03 DIAGNOSIS — M75111 Incomplete rotator cuff tear or rupture of right shoulder, not specified as traumatic: Secondary | ICD-10-CM | POA: Diagnosis not present

## 2016-10-05 ENCOUNTER — Other Ambulatory Visit: Payer: Self-pay | Admitting: *Deleted

## 2016-10-05 NOTE — Patient Outreach (Addendum)
[place] [facility]) Care Management  10/05/2016  Bridget [name] December 15, 1956 [id number]   Subjective: Telephone call to member's work phone number, no answer, left HIPAA compliant voicemail message, and requested call back.  Objective: Per medical review.   Patient has basal cell carcinoma of right cheek and diagnosis confirmed on 08/15/16.  Assessment: Received benefit exception request on 09/20/16 and update request on 10/04/16.  Requesting in network coverage at [facility] (Dr. Ishmael Holter) for upcoming Mohs procedure on 10/10/16 of right cheek.    Patient's current dermatologist (Dr. Evorn Gong) has referred patient to [facility] (Dr. Ishmael Holter) for the procedure.   Patient requesting to cover Dr. Ishmael Holter and procedure at [facility] rate.   Request review process completed with [place] Management Assistance Director and [facility] Resource Specialist.   No benefit exception needed procedure will be covered under specialist office visit payment and copayment.    Updated to member pending member contact.  Plan:  RNCM will call member for 2nd outreach attempt, benefit exception request follow up, within 10 business days, if no return call from member.   Bridget [name], BSN, [place] Management [facility] Telephonic CM Phone: [phone] Fax: [phone]

## 2016-10-08 ENCOUNTER — Other Ambulatory Visit: Payer: Self-pay | Admitting: *Deleted

## 2016-10-08 ENCOUNTER — Ambulatory Visit: Payer: Self-pay | Admitting: *Deleted

## 2016-10-08 NOTE — Patient Outreach (Signed)
[place] [facility]) Care Management  10/08/2016  Bridget [name] 22-Oct-1956 [id number]  Subjective: Received voicemail message from member and requested call back. Telephone call to patient's work number, spoke with patient, and HIPAA verified.  Advised per [employer] Benefit specialist Mohs procedures when performed in MD's office will be covered under specialist copay benefit level.  Member verbalized understanding and request written update on this benefit.    Telephone call to Surveyor, quantity of [place] Management and advised of member's request.   Telephone call to patient's work number, no answer, left HIPAA compliant voicemail message, and requested call back.    Objective: Per medical review.   Patient has basal cell carcinoma of right cheek and diagnosis confirmed on 08/15/16.  Assessment: Received benefit exception request on 09/20/16 and update request on 10/04/16.  Requesting in network coverage at [facility] (Dr. Ishmael Holter) for upcoming Mohs procedure on 10/10/16 of right cheek.    Patient's current dermatologist (Dr. Evorn Gong) has referred patient to [facility] (Dr. Ishmael Holter) for the procedure.   Patient requesting to cover Dr. Ishmael Holter and procedure at [facility] rate.   Request review process completed with [place] Management Assistance Director and [facility] Resource Specialist.   No benefit exception needed procedure will be covered under specialist office visit payment and copayment.    Updated to member pending member contact.  Plan:  RNCM will call member for 2nd outreach attempt, benefit exception request follow up, within 10 business days, if no return call from member. RNCM will follow up member to advise member to follow up with provider's office to inquiry how Mohs procedure will be billed and in what setting procedure will take place, within 10 business days, if no return call.     Megumi Treaster H.  Annia Friendly, BSN, [place] Management [facility] Telephonic CM Phone: [phone] Fax: [phone]

## 2016-10-09 ENCOUNTER — Other Ambulatory Visit: Payer: Self-pay | Admitting: *Deleted

## 2016-10-09 ENCOUNTER — Ambulatory Visit: Payer: Self-pay | Admitting: *Deleted

## 2016-10-09 NOTE — Patient Outreach (Addendum)
[facility]) Care Management  10/09/2016  Bridget [name] 12/30/1955 [id number]   Subjective: Received voicemail message from member, states she is returning call, and requested call back. Telephone call to member's work number, spoke with member, and HIPAA verified.  RNCM advised no written update available regarding benefit level and to follow up with provider to verify how procedure will be billed.   Member states the procedure will be done during MD office appointment on 10/10/16 and her local dermatologist does not do this procedure.   Advised no benefit exception needed if procedure done in MD's office, MD is in network,  and not at outpatient facility.   Member voiced understanding and states she will follow up with provider to verify how procedure will be billed.  Member will follow up with RNCM is assistance needed in the future.  Telephone call to Surveyor, quantity at [place] [employer] Bary Castilla), advised of conversation with member and outcome of benefit exception request.   Objective: Per medical review. Patient has basal cell carcinoma of right cheek and diagnosis confirmed on 08/15/16.  Assessment: Received benefit exception request on 09/20/16 and update request on 10/04/16. Requesting in network coverage at [facility] (Dr. Ishmael Holter) for upcoming Mohs procedure on 10/10/16 of right cheek. Patient's current dermatologist (Dr. Evorn Gong) has referred patient to [facility]at[facility] (Dr. Ishmael Holter) for the procedure. Patient requesting to cover Dr. Ishmael Holter and procedure at [facility] rate. Request review process completed with [place] Management Assistance Director and [facility] Resource Specialist. No benefit exception needed procedure will be covered under specialist office visit payment and copayment.  Member updated and has no care management needs at this time.   Plan: RNCM will send case closure due to  follow up completed request  to [place] [facility] at [place] [facility].  RNCM will send benefit exception outcome update to Bary Castilla at [place] Management.     Nailea Whitehorn H. Annia Friendly, BSN, [place] Management [facility] Telephonic CM Phone: [phone] Fax: [phone]

## 2016-10-10 DIAGNOSIS — L908 Other atrophic disorders of skin: Secondary | ICD-10-CM | POA: Diagnosis not present

## 2016-10-10 DIAGNOSIS — L814 Other melanin hyperpigmentation: Secondary | ICD-10-CM | POA: Diagnosis not present

## 2016-10-10 DIAGNOSIS — L578 Other skin changes due to chronic exposure to nonionizing radiation: Secondary | ICD-10-CM | POA: Diagnosis not present

## 2016-10-10 DIAGNOSIS — C44319 Basal cell carcinoma of skin of other parts of face: Secondary | ICD-10-CM | POA: Diagnosis not present

## 2016-10-26 DIAGNOSIS — H5213 Myopia, bilateral: Secondary | ICD-10-CM | POA: Diagnosis not present

## 2016-10-31 MED FILL — MELOXICAM 15 MG TABLET: 15 | 30 days supply | Qty: 30 | Fill #1

## 2016-11-01 DIAGNOSIS — H40001 Preglaucoma, unspecified, right eye: Secondary | ICD-10-CM | POA: Diagnosis not present

## 2016-12-20 MED FILL — ATOVAQUONE-PROGUANIL 250-10: 250-100 | 16 days supply | Qty: 16 | Fill #0

## 2016-12-20 MED FILL — AZITHROMYCIN 500 MG TABLET: 500 | 3 days supply | Qty: 3 | Fill #0

## 2016-12-20 MED FILL — MELOXICAM 15 MG TABLET: 15 | 30 days supply | Qty: 30 | Fill #2

## 2017-01-30 DIAGNOSIS — H40003 Preglaucoma, unspecified, bilateral: Secondary | ICD-10-CM | POA: Diagnosis not present

## 2017-02-01 DIAGNOSIS — D2271 Melanocytic nevi of right lower limb, including hip: Secondary | ICD-10-CM | POA: Diagnosis not present

## 2017-02-01 DIAGNOSIS — D2261 Melanocytic nevi of right upper limb, including shoulder: Secondary | ICD-10-CM | POA: Diagnosis not present

## 2017-02-01 DIAGNOSIS — Z85828 Personal history of other malignant neoplasm of skin: Secondary | ICD-10-CM | POA: Diagnosis not present

## 2017-02-01 DIAGNOSIS — D225 Melanocytic nevi of trunk: Secondary | ICD-10-CM | POA: Diagnosis not present

## 2017-02-15 ENCOUNTER — Ambulatory Visit: Payer: 59 | Admitting: Family Medicine

## 2017-02-19 ENCOUNTER — Other Ambulatory Visit: Payer: Self-pay | Admitting: Orthopedic Surgery

## 2017-02-20 ENCOUNTER — Encounter: Payer: Self-pay | Admitting: Family Medicine

## 2017-02-20 ENCOUNTER — Ambulatory Visit (INDEPENDENT_AMBULATORY_CARE_PROVIDER_SITE_OTHER): Payer: 59 | Admitting: Family Medicine

## 2017-02-20 VITALS — BP 122/68 | HR 75 | Temp 98.8°F | Ht 62.75 in | Wt 156.5 lb

## 2017-02-20 DIAGNOSIS — Z01810 Encounter for preprocedural cardiovascular examination: Secondary | ICD-10-CM

## 2017-02-20 DIAGNOSIS — Z01818 Encounter for other preprocedural examination: Secondary | ICD-10-CM

## 2017-02-20 NOTE — Patient Instructions (Signed)
No restrictions for surgery  Take care of yourself   Good luck with everything

## 2017-02-20 NOTE — Progress Notes (Signed)
Pre visit review using our clinic review tool, if applicable. No additional management support is needed unless otherwise documented below in the visit note. 

## 2017-02-20 NOTE — Progress Notes (Signed)
Subjective:    Patient ID: Bridget [name], female    DOB: March 18, 1956, 61 y.o.   MRN: [id number]  HPI Here for pre operative clearance for R shoulder arthroscopy and open rotator cuff repair with Dr Mack Guise on 03/19/17 She is ready to get it over with  She would like to play tennis again and pick up grand child  She tried PT   Wt Readings from Last 3 Encounters:  02/20/17 156 lb 8 oz (71 kg)  09/12/16 155 lb 12 oz (70.6 kg)  06/15/16 155 lb 8 oz (70.5 kg)   BP Readings from Last 3 Encounters:  02/20/17 122/68  09/12/16 94/64  06/15/16 112/62    She takes nsaids- will stop 5 days prior or more  No asa or anticoagulants   Hx of osteopenia and takes ca and D   She has a drug allergy to lorcet -which causes rash  Will have to be careful about pain control  Thinks it will be general anesthesia    She had her flu vaccine 10/17 Tdap May 2017   EKG today NSR with rate of 70 and no acute changes   No hx of asthma or breathing problems No hx of heart problems   No problems with anesthesia in the past - local or general    Patient Active Problem List   Diagnosis Date Noted  . Pre-operative examination 02/21/2017  . Low back pain 06/11/2016  . Osteopenia 09/30/2013  . Encounter for routine gynecological examination 01/18/2012  . Routine general medical examination at a health care facility 01/09/2012  . [place], [place] 02/06/2010  . DEGENERATIVE JOINT DISEASE, MILD 07/19/2009   Past Medical History:  Diagnosis Date  . 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 Chronic back pain    Past Surgical History:  Procedure Laterality Date  . ANKLE SURGERY Right   . CESAREAN SECTION  1984 and 1986   hodp pre term labor CS  . OVARIAN CYST SURGERY  1973  . TUBAL LIGATION  1999   Social History  Substance Use Topics  . Smoking status: Never Smoker  . Smokeless tobacco: Never Used  . Alcohol use 0.0 oz/week     Comment: 2-3 nights per week   Family History  Problem  Relation Age of Onset  . Arthritis Mother     OA with verterabral fx  . Hypertension Mother   . Fibrocystic breast disease Mother   . Cancer Maternal Grandfather     colon CA  . Diabetes Paternal Grandmother    Allergies  Allergen Reactions  . Lorcet [Hydrocodone-Acetaminophen] Rash   Current Outpatient Prescriptions on File Prior to Visit  Medication Sig Dispense Refill  . ibuprofen (ADVIL,MOTRIN) 200 MG tablet Take 200 mg by mouth every 6 (six) hours as needed.    . meloxicam (MOBIC) 15 MG tablet Take 1 tablet (15 mg total) by mouth daily as needed for pain (take with food). 30 tablet 11   No current facility-administered medications on file prior to visit.     Review of Systems Review of Systems  Constitutional: Negative for fever, appetite change, fatigue and unexpected weight change.  Eyes: Negative for pain and visual disturbance.  Respiratory: Negative for cough and shortness of breath.   Cardiovascular: Negative for cp or palpitations    Gastrointestinal: Negative for nausea, diarrhea and constipation.  Genitourinary: Negative for urgency and frequency.  Skin: Negative for pallor or rash   Neurological: Negative for weakness, light-headedness, numbness  and headaches.  Hematological: Negative for adenopathy. Does not bruise/bleed easily.  Psychiatric/Behavioral: Negative for dysphoric mood. The patient is not nervous/anxious.         Objective:   Physical Exam  Constitutional: She appears well-developed and well-nourished. No distress.  Well appearing   HENT:  Head: Normocephalic and atraumatic.  Mouth/Throat: Oropharynx is clear and moist.  Eyes: Conjunctivae and EOM are normal. Pupils are equal, round, and reactive to light.  Neck: Normal range of motion. Neck supple. No JVD present. Carotid bruit is not present. No thyromegaly present.  Cardiovascular: Normal rate, regular rhythm, normal heart sounds and intact distal pulses.  Exam reveals no gallop.     Pulmonary/Chest: Effort normal and breath sounds normal. No respiratory distress. She has no wheezes. She has no rales.  No crackles  Abdominal: Soft. Bowel sounds are normal. She exhibits no distension, no abdominal bruit and no mass. There is no tenderness.  Musculoskeletal: She exhibits no edema.  Lymphadenopathy:    She has no cervical adenopathy.  Neurological: She is alert. She has normal reflexes.  Skin: Skin is warm and dry. No rash noted. No pallor.  Psychiatric: She has a normal mood and affect.          Assessment & Plan:   Problem List Items Addressed This Visit      Other   Pre-operative examination    No restrictions for upcoming shoulder surgery  Aware to stop nsaid before surgery  Not on anticoagulation Noted med allergy to lorcet  utd imms  Re assuring EKG No cardiac pulmonary problems  No reaction to anesthesia         Other Visit Diagnoses    Pre-operative cardiovascular examination    -  Primary   Relevant Orders   EKG 12-Lead (Completed)

## 2017-02-21 DIAGNOSIS — Z01818 Encounter for other preprocedural examination: Secondary | ICD-10-CM | POA: Insufficient documentation

## 2017-02-21 NOTE — Assessment & Plan Note (Signed)
No restrictions for upcoming shoulder surgery  Aware to stop nsaid before surgery  Not on anticoagulation Noted med allergy to lorcet  utd imms  Re assuring EKG No cardiac pulmonary problems  No reaction to anesthesia

## 2017-03-12 ENCOUNTER — Other Ambulatory Visit: Payer: 59

## 2017-03-12 ENCOUNTER — Telehealth: Payer: Self-pay

## 2017-03-12 NOTE — Telephone Encounter (Signed)
I faxed the form on 02/21/17 after we saw pt, my fax prints me out a confirmation letter saying it went through, but I refaxed it now and left voicemail letting Judeen Hammans know this and advise if she doesn't get this fax then call me back and let me know

## 2017-03-12 NOTE — Telephone Encounter (Signed)
Sherry with [facility] left v/m requesting status of surgery medical clearance form that has been faxed on 02/06/17 and 03/12/17. Sherry request cb. Pt scheduled for rt shoulder surgery on 03/19/17. Can fax information requested to [phone].

## 2017-03-13 ENCOUNTER — Encounter
Admission: RE | Admit: 2017-03-13 | Discharge: 2017-03-13 | Disposition: A | Discharge status: Home / Self Care | Payer: 59 | Source: Ambulatory Visit | Attending: Orthopedic Surgery | Admitting: Orthopedic Surgery

## 2017-03-13 DIAGNOSIS — Z79899 Other long term (current) drug therapy: Secondary | ICD-10-CM | POA: Insufficient documentation

## 2017-03-13 DIAGNOSIS — M545 Low back pain: Secondary | ICD-10-CM | POA: Insufficient documentation

## 2017-03-13 DIAGNOSIS — Z833 Family history of diabetes mellitus: Secondary | ICD-10-CM | POA: Insufficient documentation

## 2017-03-13 DIAGNOSIS — Z808 Family history of malignant neoplasm of other organs or systems: Secondary | ICD-10-CM | POA: Insufficient documentation

## 2017-03-13 DIAGNOSIS — M1990 Unspecified osteoarthritis, unspecified site: Secondary | ICD-10-CM | POA: Insufficient documentation

## 2017-03-13 DIAGNOSIS — Z9851 Tubal ligation status: Secondary | ICD-10-CM | POA: Insufficient documentation

## 2017-03-13 DIAGNOSIS — M8580 Other specified disorders of bone density and structure, unspecified site: Secondary | ICD-10-CM | POA: Insufficient documentation

## 2017-03-13 DIAGNOSIS — Z01812 Encounter for preprocedural laboratory examination: Secondary | ICD-10-CM | POA: Diagnosis not present

## 2017-03-13 DIAGNOSIS — Z8249 Family history of ischemic heart disease and other diseases of the circulatory system: Secondary | ICD-10-CM | POA: Insufficient documentation

## 2017-03-13 DIAGNOSIS — G8929 Other chronic pain: Secondary | ICD-10-CM | POA: Diagnosis not present

## 2017-03-13 DIAGNOSIS — Z9889 Other specified postprocedural states: Secondary | ICD-10-CM | POA: Insufficient documentation

## 2017-03-13 LAB — CBC WITH DIFFERENTIAL/PLATELET
Basophils Absolute: 0 10*3/uL (ref 0–0.1)
Basophils Relative: 1 %
EOS ABS: 0.2 10*3/uL (ref 0–0.7)
Eosinophils Relative: 3 %
HCT: 40.6 % (ref 35.0–47.0)
HEMOGLOBIN: 13.7 g/dL (ref 12.0–16.0)
LYMPHS ABS: 1.1 10*3/uL (ref 1.0–3.6)
LYMPHS PCT: 18 %
MCH: 29.2 pg (ref 26.0–34.0)
MCHC: 33.8 g/dL (ref 32.0–36.0)
MCV: 86.5 fL (ref 80.0–100.0)
Monocytes Absolute: 0.4 10*3/uL (ref 0.2–0.9)
Monocytes Relative: 6 %
Neutro Abs: 4.5 10*3/uL (ref 1.4–6.5)
Neutrophils Relative %: 72 %
Platelets: 294 10*3/uL (ref 150–440)
RBC: 4.7 MIL/uL (ref 3.80–5.20)
RDW: 14 % (ref 11.5–14.5)
WBC: 6.3 10*3/uL (ref 3.6–11.0)

## 2017-03-13 LAB — BASIC METABOLIC PANEL
Anion gap: 6 (ref 5–15)
BUN: 19 mg/dL (ref 6–20)
CHLORIDE: 109 mmol/L (ref 101–111)
CO2: 26 mmol/L (ref 22–32)
CREATININE: 0.72 mg/dL (ref 0.44–1.00)
Calcium: 9.1 mg/dL (ref 8.9–10.3)
GFR calc non Af Amer: >60 mL/min (ref >60)
Glucose, Bld: 150 mg/dL — ABNORMAL HIGH (ref 65–99)
POTASSIUM: 3.7 mmol/L (ref 3.5–5.1)
SODIUM: 141 mmol/L (ref 135–145)

## 2017-03-13 LAB — PROTIME-INR
INR: 0.9
PROTHROMBIN TIME: 12.1 s (ref 11.4–15.2)

## 2017-03-13 LAB — APTT: aPTT: 27 seconds (ref 24–36)

## 2017-03-13 MED ORDER — CHLORHEXIDINE GLUCONATE CLOTH 2 % EX PADS
6.0000 | MEDICATED_PAD | Freq: Once | CUTANEOUS | Status: DC
  Filled 2017-03-13: qty 6

## 2017-03-13 NOTE — Patient Instructions (Signed)
Your procedure is scheduled on: 03/19/17 Tues Report to Same Day Surgery 2nd floor medical mall [facility] Entrance-take elevator on left to 2nd floor.  Check in with surgery information desk.) To find out your arrival time please call [phone] between 1PM - 3PM on 3 /26/18 Mon Remember: Instructions that are not followed completely may result in serious medical risk, up to and including death, or upon the discretion of your surgeon and anesthesiologist your surgery may need to be rescheduled.    _x___ 1. Do not eat food or drink liquids after midnight. No gum chewing or                              hard candies.     __x__ 2. No Alcohol for 24 hours before or after surgery.   __x__3. No Smoking for 24 prior to surgery.   ____  4. Bring all medications with you on the day of surgery if instructed.    __x__ 5. Notify your doctor if there is any change in your medical condition     (cold, fever, infections).     Do not wear jewelry, make-up, hairpins, clips or nail polish.  Do not wear lotions, powders, or perfumes. You may wear deodorant.  Do not shave 48 hours prior to surgery. Men may shave face and neck.  Do not bring valuables to the hospital.    [facility] is not responsible for any belongings or valuables.               Contacts, dentures or bridgework may not be worn into surgery.  Leave your suitcase in the car. After surgery it may be brought to your room.  For patients admitted to the hospital, discharge time is determined by your                       treatment team.   Patients discharged the day of surgery will not be allowed to drive home.  You will need someone to drive you home and stay with you the night of your procedure.    Please read over the following fact sheets that you were given:   [facility] Preparing for Surgery and or MRSA Information   _x___ Take anti-hypertensive (unless it includes a diuretic), cardiac, seizure, asthma,     anti-reflux and  psychiatric medicines. These include:  1. None  2.  3.  4.  5.  6.  ____Fleets enema or Magnesium Citrate as directed.   _x___ Use CHG Soap or sage wipes as directed on instruction sheet   ____ Use inhalers on the day of surgery and bring to hospital day of surgery  ____ Stop Metformin and Janumet 2 days prior to surgery.    ____ Take 1/2 of usual insulin dose the night before surgery and none on the morning     surgery.   _x___ Follow recommendations from Cardiologist, Pulmonologist or PCP regarding          stopping Aspirin, Coumadin, Pllavix ,Eliquis, Effient, or Pradaxa, and Pletal.  X____Stop Anti-inflammatories such as Advil, Aleve, Ibuprofen, Motrin, Naproxen, Naprosyn, Goodies powders or aspirin products. OK to take Tylenol and                          Celebrex. Stop Meloxicam and ibuprofen.   _x___ Stop supplements until after surgery.  But may continue  Vitamin D, Vitamin B,       and multivitamin.   ____ Bring C-Pap to the hospital.

## 2017-03-14 MED FILL — oxyCODONE HCL 5 MG TABS: 5 | 5 days supply | Qty: 60 | Fill #0

## 2017-03-14 NOTE — Pre-Procedure Instructions (Signed)
[place] Thea Alken 02/20/17

## 2017-03-19 ENCOUNTER — Ambulatory Visit: Payer: 59 | Admitting: Anesthesiology

## 2017-03-19 ENCOUNTER — Encounter
Admission: RE | Disposition: A | Discharge status: Home / Self Care | Payer: Self-pay | Source: Ambulatory Visit | Attending: Orthopedic Surgery

## 2017-03-19 ENCOUNTER — Ambulatory Visit
Admission: RE | Admit: 2017-03-19 | Discharge: 2017-03-19 | Disposition: A | Discharge status: Home / Self Care | Payer: 59 | Source: Ambulatory Visit | Attending: Orthopedic Surgery | Admitting: Orthopedic Surgery

## 2017-03-19 DIAGNOSIS — Z5333 Arthroscopic surgical procedure converted to open procedure: Secondary | ICD-10-CM | POA: Diagnosis not present

## 2017-03-19 DIAGNOSIS — M7551 Bursitis of right shoulder: Secondary | ICD-10-CM | POA: Diagnosis not present

## 2017-03-19 DIAGNOSIS — M75111 Incomplete rotator cuff tear or rupture of right shoulder, not specified as traumatic: Secondary | ICD-10-CM | POA: Diagnosis not present

## 2017-03-19 DIAGNOSIS — X58XXXA Exposure to other specified factors, initial encounter: Secondary | ICD-10-CM | POA: Insufficient documentation

## 2017-03-19 DIAGNOSIS — M25511 Pain in right shoulder: Secondary | ICD-10-CM | POA: Diagnosis not present

## 2017-03-19 DIAGNOSIS — M7541 Impingement syndrome of right shoulder: Secondary | ICD-10-CM | POA: Diagnosis not present

## 2017-03-19 DIAGNOSIS — S46011A Strain of muscle(s) and tendon(s) of the rotator cuff of right shoulder, initial encounter: Secondary | ICD-10-CM | POA: Diagnosis not present

## 2017-03-19 DIAGNOSIS — Z8249 Family history of ischemic heart disease and other diseases of the circulatory system: Secondary | ICD-10-CM | POA: Insufficient documentation

## 2017-03-19 DIAGNOSIS — G8918 Other acute postprocedural pain: Secondary | ICD-10-CM | POA: Diagnosis not present

## 2017-03-19 HISTORY — PX: SHOULDER ARTHROSCOPY WITH OPEN ROTATOR CUFF REPAIR AND DISTAL CLAVICLE ACROMINECTOMY: SHX5683

## 2017-03-19 SURGERY — SHOULDER ARTHROSCOPY WITH OPEN ROTATOR CUFF REPAIR AND DISTAL CLAVICLE ACROMINECTOMY
Anesthesia: Regional | Site: Shoulder | Laterality: Right | Wound class: Clean

## 2017-03-19 MED ORDER — NEOMYCIN-POLYMYXIN B GU 40-200000 IR SOLN
Status: AC
Start: 2017-03-19 — End: ?
  Filled 2017-03-19: qty 2

## 2017-03-19 MED ORDER — ONDANSETRON HCL 4 MG/2ML IJ SOLN
INTRAMUSCULAR | Status: DC | PRN
  Administered 2017-03-19: 4 mg via INTRAVENOUS

## 2017-03-19 MED ORDER — ROPIVACAINE HCL 5 MG/ML IJ SOLN
INTRAMUSCULAR | Status: AC
  Filled 2017-03-19: qty 40

## 2017-03-19 MED ORDER — SUCCINYLCHOLINE CHLORIDE 20 MG/ML IJ SOLN
INTRAMUSCULAR | Status: DC | PRN
  Administered 2017-03-19: 80 mg via INTRAVENOUS

## 2017-03-19 MED ORDER — EPINEPHRINE PF 1 MG/ML IJ SOLN
INTRAMUSCULAR | Status: DC | PRN
  Administered 2017-03-19: 9 mL

## 2017-03-19 MED ORDER — FENTANYL CITRATE (PF) 100 MCG/2ML IJ SOLN
50.0000 ug | Freq: Once | INTRAMUSCULAR | Status: AC
  Administered 2017-03-19: 50 ug via INTRAVENOUS

## 2017-03-19 MED ORDER — LACTATED RINGERS IV SOLN
INTRAVENOUS | Status: DC
  Administered 2017-03-19: 07:00:00 via INTRAVENOUS

## 2017-03-19 MED ORDER — EPINEPHRINE PF 1 MG/ML IJ SOLN
INTRAMUSCULAR | Status: AC
  Filled 2017-03-19: qty 1

## 2017-03-19 MED ORDER — NEOMYCIN-POLYMYXIN B GU 40-200000 IR SOLN
Status: DC | PRN
  Administered 2017-03-19: 2 mL

## 2017-03-19 MED ORDER — FENTANYL CITRATE (PF) 100 MCG/2ML IJ SOLN: 25.0000 ug | INTRAMUSCULAR | Status: DC | PRN

## 2017-03-19 MED ORDER — PROPOFOL 10 MG/ML IV BOLUS
INTRAVENOUS | Status: AC
  Filled 2017-03-19: qty 20

## 2017-03-19 MED ORDER — ONDANSETRON HCL 4 MG/2ML IJ SOLN
INTRAMUSCULAR | Status: AC
  Filled 2017-03-19: qty 2

## 2017-03-19 MED ORDER — OXYCODONE HCL 5 MG PO TABS
ORAL_TABLET | ORAL | Status: AC
  Filled 2017-03-19: qty 1

## 2017-03-19 MED ORDER — BUPIVACAINE HCL (PF) 0.25 % IJ SOLN
INTRAMUSCULAR | Status: AC
  Filled 2017-03-19: qty 30

## 2017-03-19 MED ORDER — ROCURONIUM BROMIDE 50 MG/5ML IV SOLN
INTRAVENOUS | Status: AC
  Filled 2017-03-19: qty 1

## 2017-03-19 MED ORDER — ONDANSETRON HCL 4 MG PO TABS: 4.0000 mg | ORAL_TABLET | Freq: Three times a day (TID) | ORAL | 0 refills | Status: DC | PRN

## 2017-03-19 MED ORDER — OXYCODONE HCL 5 MG PO TABS: 5.0000 mg | ORAL_TABLET | ORAL | 0 refills | Status: DC | PRN

## 2017-03-19 MED ORDER — BUPIVACAINE HCL (PF) 0.25 % IJ SOLN
INTRAMUSCULAR | Status: DC | PRN
  Administered 2017-03-19: 30 mL

## 2017-03-19 MED ORDER — ROCURONIUM BROMIDE 100 MG/10ML IV SOLN
INTRAVENOUS | Status: DC | PRN
  Administered 2017-03-19: 45 mg via INTRAVENOUS
  Administered 2017-03-19: 5 mg via INTRAVENOUS

## 2017-03-19 MED ORDER — FAMOTIDINE 20 MG PO TABS
ORAL_TABLET | ORAL | Status: AC
  Administered 2017-03-19: 20 mg via ORAL
  Filled 2017-03-19: qty 1

## 2017-03-19 MED ORDER — FENTANYL CITRATE (PF) 100 MCG/2ML IJ SOLN
INTRAMUSCULAR | Status: DC | PRN
  Administered 2017-03-19: 100 ug via INTRAVENOUS

## 2017-03-19 MED ORDER — LIDOCAINE HCL (PF) 1 % IJ SOLN
INTRAMUSCULAR | Status: AC
  Filled 2017-03-19: qty 30

## 2017-03-19 MED ORDER — FENTANYL CITRATE (PF) 100 MCG/2ML IJ SOLN
INTRAMUSCULAR | Status: AC
  Filled 2017-03-19: qty 2

## 2017-03-19 MED ORDER — PHENYLEPHRINE HCL 10 MG/ML IJ SOLN
INTRAMUSCULAR | Status: DC | PRN
  Administered 2017-03-19: 50 ug/min via INTRAVENOUS

## 2017-03-19 MED ORDER — FAMOTIDINE 20 MG PO TABS
20.0000 mg | ORAL_TABLET | Freq: Once | ORAL | Status: AC
Start: 2017-03-19 — End: 2017-03-19
  Administered 2017-03-19: 20 mg via ORAL

## 2017-03-19 MED ORDER — CEFAZOLIN SODIUM-DEXTROSE 2-4 GM/100ML-% IV SOLN: 2.0000 g | INTRAVENOUS | Status: DC

## 2017-03-19 MED ORDER — CEFAZOLIN SODIUM-DEXTROSE 2-4 GM/100ML-% IV SOLN
INTRAVENOUS | Status: AC
  Filled 2017-03-19: qty 100

## 2017-03-19 MED ORDER — LIDOCAINE HCL (PF) 1 % IJ SOLN
INTRAMUSCULAR | Status: AC
  Filled 2017-03-19: qty 5

## 2017-03-19 MED ORDER — DEXAMETHASONE SODIUM PHOSPHATE 10 MG/ML IJ SOLN
INTRAMUSCULAR | Status: AC
  Filled 2017-03-19: qty 1

## 2017-03-19 MED ORDER — ONDANSETRON HCL 4 MG/2ML IJ SOLN: 4.0000 mg | Freq: Once | INTRAMUSCULAR | Status: DC | PRN

## 2017-03-19 MED ORDER — OXYCODONE HCL 5 MG PO TABS
5.0000 mg | ORAL_TABLET | Freq: Once | ORAL | Status: AC
  Administered 2017-03-19: 5 mg via ORAL
  Filled 2017-03-19: qty 1

## 2017-03-19 MED ORDER — EPINEPHRINE 30 MG/30ML IJ SOLN
INTRAMUSCULAR | Status: AC
  Filled 2017-03-19: qty 1

## 2017-03-19 MED ORDER — PHENYLEPHRINE HCL 10 MG/ML IJ SOLN
INTRAMUSCULAR | Status: DC | PRN
  Administered 2017-03-19 (×3): 100 ug via INTRAVENOUS

## 2017-03-19 MED ORDER — ROPIVACAINE HCL 5 MG/ML IJ SOLN
INTRAMUSCULAR | Status: DC | PRN
  Administered 2017-03-19: 30 mL via PERINEURAL

## 2017-03-19 MED ORDER — DEXAMETHASONE SODIUM PHOSPHATE 10 MG/ML IJ SOLN
INTRAMUSCULAR | Status: DC | PRN
  Administered 2017-03-19: 4 mg via INTRAVENOUS

## 2017-03-19 MED ORDER — SUGAMMADEX SODIUM 200 MG/2ML IV SOLN
INTRAVENOUS | Status: DC | PRN
  Administered 2017-03-19: 140 mg via INTRAVENOUS

## 2017-03-19 MED ORDER — PROPOFOL 10 MG/ML IV BOLUS
INTRAVENOUS | Status: DC | PRN
  Administered 2017-03-19: 170 mg via INTRAVENOUS

## 2017-03-19 MED ORDER — MIDAZOLAM HCL 2 MG/2ML IJ SOLN
2.0000 mg | Freq: Once | INTRAMUSCULAR | Status: AC
  Administered 2017-03-19: 2 mg via INTRAVENOUS

## 2017-03-19 MED ORDER — CEFAZOLIN SODIUM-DEXTROSE 2-3 GM-% IV SOLR
INTRAVENOUS | Status: DC | PRN
  Administered 2017-03-19: 2 g via INTRAVENOUS

## 2017-03-19 MED ORDER — SUCCINYLCHOLINE CHLORIDE 20 MG/ML IJ SOLN
INTRAMUSCULAR | Status: AC
  Filled 2017-03-19: qty 1

## 2017-03-19 MED ORDER — PHENYLEPHRINE HCL 10 MG/ML IJ SOLN
INTRAMUSCULAR | Status: AC
  Filled 2017-03-19: qty 1

## 2017-03-19 SURGICAL SUPPLY — 73 items
ADAPTER IRRIG TUBE 2 SPIKE SOL (ADAPTER) ×4 IMPLANT
ANCHOR ALL-SUT Q-FIX 2.8 (Anchor) ×4 IMPLANT
BUR RADIUS 4.0X18.5 (BURR) ×2 IMPLANT
BUR RADIUS 5.5 (BURR) ×2 IMPLANT
CANISTER SUCT LVC 12 LTR MEDI- (MISCELLANEOUS) ×2 IMPLANT
CANNULA 5.75X7 CRYSTAL CLEAR (CANNULA) ×4 IMPLANT
CANNULA PARTIAL THREAD 2X7 (CANNULA) ×2 IMPLANT
CANNULA TWIST IN 8.25X9CM (CANNULA) ×4 IMPLANT
CONNECTOR PERFECT PASSER (CONNECTOR) ×2 IMPLANT
COOLER POLAR GLACIER W/PUMP (MISCELLANEOUS) ×2 IMPLANT
CRADLE LAMINECT ARM (MISCELLANEOUS) ×4 IMPLANT
DECANTER SPIKE VIAL GLASS SM (MISCELLANEOUS) ×2 IMPLANT
DEVICE SUCT BLK HOLE OR FLOOR (MISCELLANEOUS) ×2 IMPLANT
DRAPE IMP U-DRAPE 54X76 (DRAPES) ×4 IMPLANT
DRAPE INCISE IOBAN 66X45 STRL (DRAPES) ×2 IMPLANT
DRAPE SHEET LG 3/4 BI-LAMINATE (DRAPES) ×2 IMPLANT
DRAPE U-SHAPE 47X51 STRL (DRAPES) ×2 IMPLANT
DURAPREP 26ML APPLICATOR (WOUND CARE) ×6 IMPLANT
ELECT REM PT RETURN 9FT ADLT (ELECTROSURGICAL) ×2
ELECTRODE REM PT RTRN 9FT ADLT (ELECTROSURGICAL) ×1 IMPLANT
GAUZE PETRO XEROFOAM 1X8 (MISCELLANEOUS) ×2 IMPLANT
GAUZE SPONGE 4X4 12PLY STRL (GAUZE/BANDAGES/DRESSINGS) ×2 IMPLANT
GLOVE BIO SURGEON STRL SZ 6.5 (GLOVE) ×2 IMPLANT
GLOVE BIOGEL PI IND STRL 7.0 (GLOVE) ×1 IMPLANT
GLOVE BIOGEL PI IND STRL 8 (GLOVE) ×3 IMPLANT
GLOVE BIOGEL PI IND STRL 9 (GLOVE) ×2 IMPLANT
GLOVE BIOGEL PI INDICATOR 7.0 (GLOVE) ×1
GLOVE BIOGEL PI INDICATOR 8 (GLOVE) ×3
GLOVE BIOGEL PI INDICATOR 9 (GLOVE) ×2
GLOVE SURG 9.0 ORTHO LTXF (GLOVE) ×6 IMPLANT
GLOVE SURG SYN 7.5  E (GLOVE) ×2
GLOVE SURG SYN 7.5 E (GLOVE) ×2 IMPLANT
GOWN STRL REUS TWL 2XL XL LVL4 (GOWN DISPOSABLE) ×2 IMPLANT
GOWN STRL REUS W/ TWL LRG LVL3 (GOWN DISPOSABLE) ×2 IMPLANT
GOWN STRL REUS W/TWL LRG LVL3 (GOWN DISPOSABLE) ×2
IV LACTATED RINGER IRRG 3000ML (IV SOLUTION) ×9
IV LR IRRIG 3000ML ARTHROMATIC (IV SOLUTION) ×9 IMPLANT
KIT RM TURNOVER STRD PROC AR (KITS) ×2 IMPLANT
KIT STABILIZATION SHOULDER (MISCELLANEOUS) ×2 IMPLANT
KIT SUTURE 2.8 Q-FIX DISP (MISCELLANEOUS) ×2 IMPLANT
KIT SUTURETAK 3.0 INSERT PERC (KITS) IMPLANT
MANIFOLD NEPTUNE II (INSTRUMENTS) ×2 IMPLANT
MASK FACE SPIDER DISP (MASK) ×2 IMPLANT
MAT BLUE FLOOR 46X72 FLO (MISCELLANEOUS) ×4 IMPLANT
NDL SAFETY 18GX1.5 (NEEDLE) ×2 IMPLANT
NDL SAFETY 22GX1.5 (NEEDLE) ×2 IMPLANT
NS IRRIG 500ML POUR BTL (IV SOLUTION) ×2 IMPLANT
PACK ARTHROSCOPY SHOULDER (MISCELLANEOUS) ×2 IMPLANT
PAD WRAPON POLAR SHDR XLG (MISCELLANEOUS) ×1 IMPLANT
PASSER SUT CAPTURE FIRST (SUTURE) ×2 IMPLANT
SET TUBE SUCT SHAVER OUTFL 24K (TUBING) ×2 IMPLANT
SET TUBE TIP INTRA-ARTICULAR (MISCELLANEOUS) ×2 IMPLANT
STRIP CLOSURE SKIN 1/2X4 (GAUZE/BANDAGES/DRESSINGS) ×2 IMPLANT
SUT ETHILON 4-0 (SUTURE) ×1
SUT ETHILON 4-0 FS2 18XMFL BLK (SUTURE) ×1
SUT KNTLS 2.8 MAGNUM (Anchor) ×4 IMPLANT
SUT LASSO 90 DEG SD STR (SUTURE) IMPLANT
SUT MNCRL 4-0 (SUTURE) ×1
SUT MNCRL 4-0 27XMFL (SUTURE) ×1
SUT PDS AB 0 CT1 27 (SUTURE) ×2 IMPLANT
SUT PERFECTPASSER WHITE CART (SUTURE) ×4 IMPLANT
SUT SMART STITCH CARTRIDGE (SUTURE) ×6 IMPLANT
SUT VIC AB 0 CT1 36 (SUTURE) ×2 IMPLANT
SUT VIC AB 2-0 CT2 27 (SUTURE) ×2 IMPLANT
SUTURE ETHLN 4-0 FS2 18XMF BLK (SUTURE) ×1 IMPLANT
SUTURE MAGNUM WIRE 2X48 BLK (SUTURE) IMPLANT
SUTURE MNCRL 4-0 27XMF (SUTURE) ×1 IMPLANT
SYRINGE 10CC LL (SYRINGE) ×2 IMPLANT
TAPE MICROFOAM 4IN (TAPE) ×2 IMPLANT
TUBING ARTHRO INFLOW-ONLY STRL (TUBING) ×2 IMPLANT
TUBING CONNECTING 10 (TUBING) ×2 IMPLANT
WAND HAND CNTRL MULTIVAC 90 (MISCELLANEOUS) ×2 IMPLANT
WRAPON POLAR PAD SHDR XLG (MISCELLANEOUS) ×2

## 2017-03-19 NOTE — H&P (Signed)
The patient has been re-examined, and the chart reviewed, and there have been no interval changes to the documented history and physical.    The risks, benefits, and alternatives have been discussed at length, and the patient is willing to proceed.   

## 2017-03-19 NOTE — Anesthesia Procedure Notes (Signed)
Procedure Name: Intubation Performed by: Ameliya Nicotra Pre-anesthesia Checklist: Patient identified, Patient being monitored, Timeout performed, Emergency Drugs available and Suction available Patient Re-evaluated:Patient Re-evaluated prior to inductionOxygen Delivery Method: Circle system utilized Preoxygenation: Pre-oxygenation with 100% oxygen Intubation Type: IV induction Ventilation: Mask ventilation without difficulty Laryngoscope Size: Mac and 3 Grade View: Grade I Tube type: Oral Tube size: 7.0 mm Number of attempts: 1 Airway Equipment and Method: Stylet Placement Confirmation: ETT inserted through vocal cords under direct vision,  positive ETCO2 and breath sounds checked- equal and bilateral Secured at: 22 cm Tube secured with: Tape Dental Injury: Teeth and Oropharynx as per pre-operative assessment        

## 2017-03-19 NOTE — Op Note (Signed)
03/19/2017  11:47 AM  PATIENT:  Bridget [name]  61 y.o. female  PRE-OPERATIVE DIAGNOSIS:  High grade partial thickness tear of the supraspinatus tendon with subacromial impingement  POST-OPERATIVE DIAGNOSIS:  High grade partial thickness tear of the supraspinatus tendon with subacromial impingement  PROCEDURE:  Procedure(s): SHOULDER ARTHROSCOPY WITH OPEN ROTATOR CUFF REPAIR AND SUBACROMINAL DECOMPRESSION (Right)  SURGEON:  Surgeon(s) and Role:    * Thornton Park, MD - Primary  ANESTHESIA:   local, general and paracervical block   PREOPERATIVE INDICATIONS:  Bridget [name] is a  61 y.o. female with a diagnosis of grade partial thickness tear of the supraspinatus and subacromial bursitis confirmed by MRI.  Patient has failed conservative measures and elected for surgical management.    The risks benefits and alternatives were discussed with the patient preoperatively including but not limited to the risks of infection, bleeding, nerve injury, persistent pain or weakness, failure of the hardware, re-tear of the rotator cuff and the need for further surgery. Medical risks include DVT and pulmonary embolism, myocardial infarction, stroke, pneumonia, respiratory failure and death. Patient understood these risks and wished to proceed.  OPERATIVE IMPLANTS: ArthroCare Magnum 2 anchors x 2 & Smith and Nephew Q Fix anchors x 2  OPERATIVE FINDINGS: Patient had a high-grade partial-thickness tear of the supraspinatus primarily involving the bursal side of the tendon. Patient had low-grade fraying of the subscapularis and biceps tendon. There is no anterior or posterior labral tears and no SLAP tear seen. There is no focal chondral lesions of the glenohumeral joint.  Patient did not have advanced acromioclavicular joint arthrosis by MRI. She had extensive subacromial bursitis secondary to impingement.  OPERATIVE PROCEDURE: The patient was met in the preoperative area. The right shoulder was  signed with the word yes and my initials according the hospital's correct site of surgery protocol.  History and physical was updated. Patient underwent a right interscalene block by the anesthesia service in the preoperative area. Patient was brought to the operating room where she underwent general anesthesia. The patient was placed in a beachchair position.  A spider arm positioner was used for this case. Examination under anesthesia revealed no limitation of motion or instability with load shift testing. The patient had a negative sulcus sign.  Patient was prepped and draped in a sterile fashion. A timeout was performed to verify the patient's name, date of birth, medical record number, correct site of surgery and correct procedure to be performed there was also used to verify the patient received antibiotics that all appropriate instruments, implants and radiographs studies were available in the room. Once all in attendance were in agreement case began.  Bony landmarks were drawn out with a surgical marker along with proposed arthroscopy incisions. These were pre-injected with 1% lidocaine plain. An 11 blade was used to establish a posterior portal through which the arthroscope was placed in the glenohumeral joint. A full diagnostic examination of the shoulder was performed.  The arthroscopic findings are noted above..  Patient had debridement of the supraspinatus with a 4.0 resector shaver blade from the articular side. A lateral portal was then created and the 4.0 resector shaver blade was then used to debride supraspinatus tear from the bursal side.  A 4.0 mm resector shaver blade was then used to debride the greater tuberosity of all torn fibers of the rotator cuff until punctate bleeding was identified.   The arthroscope was then placed in the subacromial space. Extensive bursitis was encountered and debrided using a  4-0 resector shaver blade and a 90 ArthroCare wand from a lateral portal. A  subacromial decompression was also performed using a 5.5 mm resector shaver blade from the lateral portal. A single perfect Pass suture was then placed in the lateral border of the supraspinatus tear. Arthroscopic images were taken of the tear. All arthroscopic instruments were then removed.  A saber-type incision was made along the lateral border of the acromion. The deltoid muscle was identified and split in line with its fibers which allowed visualization of the rotator cuff. The Perfect Pass suture previously placed in the lateral border of the rotator cuff was brought out through the deltoid split.   Two [employer] anchor was placed at the articular margin of the humeral head with the greater tuberosity. The suture limbs of the Q Fix anchor were passed medially through the rotator cuff using a First Pass suture passer.   A second perfect Pass suture was then placed into the lateral border of the rotator cuff.  The two Perfect Pass sutures were then anchored to the greater tuberosity footprint using two Magnum 2 anchors. These anchors were tensioned to allow for anatomic reduction of the rotator cuff to the greater tuberosity. The medial row sutures were then tied down using an arthroscopic knot tying technique.  Arthroscopic images of the repair were taken with the arthroscope both externally and from inside the glenohumeral joint.  All incisions were copiously irrigated. The deltoid fascia was repaired using a 0 Vicryl suture.  The subcutaneous tissue of all incisions were closed with a 2-0 Vicryl. Skin closure for the arthroscopic incisions was performed with 4-0 nylon. The skin edges of the saber incision was approximated with a running 4-0 undyed Monocryl.  0.25% Marcaine plain was then injected into the subacromial space for postoperative pain control. A dry sterile dressing was applied.  The patient was placed in an abduction sling, with a Polar Care sleeve.  All sharp and it instrument  counts were correct at the conclusion of the case. I was scrubbed and present for the entire case. I spoke with the patient's husband postoperatively to let him know the case had gone without complication and the patient was stable in recovery room.

## 2017-03-19 NOTE — Transfer of Care (Signed)
Immediate Anesthesia Transfer of Care Note  Patient: Bridget [name]  Procedure(s) Performed: Procedure(s): SHOULDER ARTHROSCOPY WITH OPEN ROTATOR CUFF REPAIR AND SUBACROMINAL DECOMPRESSION (Right)  Patient Location: PACU  Anesthesia Type:General  Level of Consciousness: sedated and responds to stimulation  Airway & Oxygen Therapy: Patient Spontanous Breathing and Patient connected to nasal cannula oxygen  Post-op Assessment: Report given to RN and Post -op Vital signs reviewed and stable  Post vital signs: Reviewed and stable  Last Vitals:  Vitals:   03/19/17 1123 03/19/17 1126  BP: (!) 104/47 (!) 104/47  Pulse: 81 77  Resp: 15 14  Temp: 36.2 C     Last Pain:  Vitals:   03/19/17 0610  TempSrc: Tympanic         Complications: No apparent anesthesia complications

## 2017-03-19 NOTE — Anesthesia Procedure Notes (Signed)
Anesthesia Regional Block: Interscalene brachial plexus block   Pre-Anesthetic Checklist: ,, timeout performed, Correct Patient, Correct Site, Correct Laterality, Correct Procedure, Correct Position, site marked, Risks and benefits discussed,  Surgical consent,  Pre-op evaluation,  At surgeon's request and post-op pain management  Laterality: Right  Prep: chloraprep       Needles:  Injection technique: Single-shot  Needle Type: Echogenic Stimulator Needle     Needle Length: 5cm  Needle Gauge: 22     Additional Needles:   Procedures: ultrasound guided, nerve stimulator,,,,,,   Nerve Stimulator or Paresthesia:  Response: biceps flexion, 0.6 mA,   Additional Responses:   Narrative:  Start time: 03/19/2017 7:50 AM End time: 03/19/2017 7:54 AM Injection made incrementally with aspirations every 5 mL.  Performed by: Personally   Additional Notes: Functioning IV was confirmed and monitors were applied.  A 96mm 22ga Stimuplex needle was used. Sterile prep and drape,hand hygiene and sterile gloves were used.  Negative aspiration and negative test dose prior to incremental administration of local anesthetic. The patient tolerated the procedure well.

## 2017-03-19 NOTE — Anesthesia Post-op Follow-up Note (Cosign Needed)
Anesthesia QCDR form completed.        

## 2017-03-19 NOTE — Anesthesia Preprocedure Evaluation (Signed)
Anesthesia Evaluation  Patient identified by MRN, date of birth, ID band Patient awake    Reviewed: Allergy & Precautions, NPO status , Patient's Chart, lab work & pertinent test results  History of Anesthesia Complications Negative for: history of anesthetic complications  Airway Mallampati: II       Dental   Pulmonary neg pulmonary ROS,           Cardiovascular negative cardio ROS       Neuro/Psych negative neurological ROS     GI/Hepatic negative GI ROS, Neg liver ROS,   Endo/Other  negative endocrine ROS  Renal/GU negative Renal ROS     Musculoskeletal   Abdominal   Peds  Hematology negative hematology ROS (+)   Anesthesia Other Findings   Reproductive/Obstetrics                             Anesthesia Physical Anesthesia Plan  ASA: II  Anesthesia Plan: General and Regional   Post-op Pain Management:  Regional for Post-op pain   Induction: Intravenous  Airway Management Planned: Oral ETT  Additional Equipment:   Intra-op Plan:   Post-operative Plan:   Informed Consent: I have reviewed the patients History and Physical, chart, labs and discussed the procedure including the risks, benefits and alternatives for the proposed anesthesia with the patient or authorized representative who has indicated his/her understanding and acceptance.     Plan Discussed with:   Anesthesia Plan Comments:         Anesthesia Quick Evaluation

## 2017-03-19 NOTE — Anesthesia Postprocedure Evaluation (Signed)
Anesthesia Post Note  Patient: Bridget [name]  Procedure(s) Performed: Procedure(s) (LRB): SHOULDER ARTHROSCOPY WITH OPEN ROTATOR CUFF REPAIR AND SUBACROMINAL DECOMPRESSION (Right)  Patient location during evaluation: PACU Anesthesia Type: Regional Level of consciousness: awake and alert Pain management: pain level controlled Vital Signs Assessment: post-procedure vital signs reviewed and stable Respiratory status: spontaneous breathing and respiratory function stable Cardiovascular status: stable Anesthetic complications: no     Last Vitals:  Vitals:   03/19/17 1126 03/19/17 1140  BP: (!) 104/47 (!) 109/51  Pulse: 77 72  Resp: 14 10  Temp:      Last Pain:  Vitals:   03/19/17 0610  TempSrc: Tympanic                 [name]

## 2017-03-19 NOTE — Discharge Instructions (Signed)

## 2017-03-20 ENCOUNTER — Encounter: Payer: Self-pay | Admitting: Orthopedic Surgery

## 2017-03-28 ENCOUNTER — Encounter: Payer: Self-pay | Admitting: Physical Therapy

## 2017-03-28 ENCOUNTER — Ambulatory Visit: Payer: 59 | Attending: Orthopedic Surgery | Admitting: Physical Therapy

## 2017-03-28 DIAGNOSIS — M62838 Other muscle spasm: Secondary | ICD-10-CM | POA: Insufficient documentation

## 2017-03-28 DIAGNOSIS — M6281 Muscle weakness (generalized): Secondary | ICD-10-CM | POA: Insufficient documentation

## 2017-03-28 DIAGNOSIS — M25511 Pain in right shoulder: Secondary | ICD-10-CM | POA: Insufficient documentation

## 2017-03-29 NOTE — Therapy (Signed)
[facility] [address] [place], [address] Phone: [phone]   Fax:  [phone]  Physical Therapy Evaluation  Patient Details  Name: Bridget [name] MRN: [id number] Date of Birth: 1956-03-21 Referring Provider: Thornton Park MD  Encounter Date: 03/28/2017      PT End of Session - 03/28/17 1202    Visit Number 1   Number of Visits 16   Date for PT Re-Evaluation 05/23/17   PT Start Time 1118   PT Stop Time 1200   PT Time Calculation (min) 42 min   Activity Tolerance Patient tolerated treatment well;Patient limited by pain   Behavior During Therapy [facility] for tasks assessed/performed      Past Medical History:  Diagnosis Date  . 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 Chronic back pain     Past Surgical History:  Procedure Laterality Date  . ANKLE SURGERY Right   . CESAREAN SECTION  1984 and 1986   hodp pre term labor CS  . OVARIAN CYST SURGERY  1973  . SHOULDER ARTHROSCOPY WITH OPEN ROTATOR CUFF REPAIR AND DISTAL CLAVICLE ACROMINECTOMY Right 03/19/2017   Procedure: SHOULDER ARTHROSCOPY WITH OPEN ROTATOR CUFF REPAIR AND SUBACROMINAL DECOMPRESSION;  Surgeon: Thornton Park, MD;  Location: [facility] ORS;  Service: Orthopedics;  Laterality: Right;  . TUBAL LIGATION  1999  . WRIST GANGLION EXCISION      There were no vitals filed for this visit.       Subjective Assessment - 03/28/17 1131    Subjective Patient reports she has aching in her shoulder today and is taking ibuprofen and tylenol alternating each. She is having difficulty sleeping.     Patient is accompained by: Family member   Pertinent History May 2017 lifted a case of water and felt a pain in right shoulder. She had PT prior to surgery without lasting results and then had MRI and underwent RTC repair 03/19/2017. She also has back pain.    Limitations Lifting;House hold activities;Other (comment)  work   Patient Stated Goals return to  full functional use right UE to return to prior level of function   Currently in Pain? Yes   Pain Score 3    Pain Location Shoulder   Pain Orientation Right   Pain Descriptors / Indicators Aching   Pain Type Acute pain;Surgical pain   Pain Onset 1 to 4 weeks ago   Pain Frequency Constant            [facility] PT Assessment - 03/28/17 1125      Assessment   Medical Diagnosis incomplete rotator cuff tear or rupture right shoulder; s/p rotator cuff surgery   Referring Provider Thornton Park MD   Onset Date/Surgical Date 03/19/17   Hand Dominance Right   Next MD Visit 04/15/2017   Prior Therapy yes, pre surgery     Precautions   Precautions Shoulder   Type of Shoulder Precautions s/p RTC repair     Balance Screen   Has the patient fallen in the past 6 months Yes   How many times? 1   Has the patient had a decrease in activity level because of a fear of falling?  No   Is the patient reluctant to leave their home because of a fear of falling?  No     Home Ecologist residence     Prior Function   Level of Independence Independent   Vocation Part time  employment   Press photographer, Estate manager/land agent   Leisure gardening, tennis     Cognition   Overall Cognitive Status Within Functional Limits for tasks assessed      Objective:  Observation; patient with right shoulder sling with bolster in place on arrival; incision clean and dry, healing well AROM:  right UE elbow, forearm, wrist, hand all planes of motion WNLs,  Left UE AROM WNL throughout PROM: right shoulder flexion 0-80 with pain limiting further motion; IR across trunk in plane of scapula with increased pain in shoulder; ER 0 degrees Palpation: + tender with increased tone right UE pectoral muscle, biceps, upper trapezius muscle  Outcome measure: QuickDash 80% (0 = no self perceived disability)        PT Education - 03/28/17 1155    Education provided Yes    Education Details HEP: active elbow, forearm and wrist/hand exercises, scpaulr retraction, positioning right shoulder to decrease strain   Person(s) Educated Patient   Methods Explanation;Demonstration;Verbal cues   Comprehension Verbalized understanding;Returned demonstration;Verbal cues required             PT Long Term Goals - 03/28/17 1200      PT LONG TERM GOAL #1   Title Patient will demonstrate improved functional use right UE with decreased pain to mild with QuickDash score of 40% or less by 05/23/2017   Baseline QuickDash 80%   Status New     PT LONG TERM GOAL #2   Title Patient will be independent with home program for pain control, flexibiltiy and strength by 05/23/2017 to allow self management following discharge from physical therapy    Baseline limited knowlege of appropriate pain control strategies, exercises and progression without moderate assistance   Status New               Plan - 03/28/17 1300    Clinical Impression Statement Patient is a 61 year old right hand dominant female who presents s/p right shoulder surgery with limitations of pain, decreased ROM, strength and functional use of right UE. Her quick Dash score of 80% indicates stereve self perceived disabiltiy. She has limited knowledge of appropriae pain control, exercises and progression in order to return to prior level of function.    Rehab Potential Good   Clinical Impairments Affecting Rehab Potential (+)acute condition, motivated, prior level of function (-) chronic condition prior to surgery   PT Frequency 2x / week   PT Duration 8 weeks   PT Treatment/Interventions Patient/family education;Electrical Stimulation;Cryotherapy;Moist Heat;Neuromuscular re-education;Therapeutic exercise;Manual techniques   PT Next Visit Plan therapeutic exercise, pain control   PT Home Exercise Plan scapular retraction, active motion right elbow, forearm wrist, hand   Consulted and Agree with Plan of Care  Patient      Patient will benefit from skilled therapeutic intervention in order to improve the following deficits and impairments:  Decreased strength, Pain, Impaired perceived functional ability, Decreased activity tolerance, Decreased endurance, Increased muscle spasms, Impaired UE functional use, Decreased range of motion  Visit Diagnosis: Acute pain of right shoulder - Plan: PT plan of care cert/re-cert  Muscle weakness (generalized) - Plan: PT plan of care cert/re-cert     Problem List Patient Active Problem List   Diagnosis Date Noted  . Pre-operative examination 02/21/2017  . Low back pain 06/11/2016  . Osteopenia 09/30/2013  . Encounter for routine gynecological examination 01/18/2012  . Routine general medical examination at a health care facility 01/09/2012  . [place] 02/06/2010  . DEGENERATIVE JOINT DISEASE,  MILD 07/19/2009    Jomarie Longs PT 03/29/2017, 6:42 PM  [facility] [address], [place], [address] Phone: [phone]   Fax:  [phone]  Name: EKAM BONEBRAKE MRN: [id number] Date of Birth: 1956/04/12

## 2017-04-01 ENCOUNTER — Encounter: Payer: Self-pay | Admitting: Physical Therapy

## 2017-04-01 ENCOUNTER — Ambulatory Visit: Payer: 59 | Admitting: Physical Therapy

## 2017-04-01 DIAGNOSIS — M6281 Muscle weakness (generalized): Secondary | ICD-10-CM

## 2017-04-01 DIAGNOSIS — M62838 Other muscle spasm: Secondary | ICD-10-CM | POA: Diagnosis not present

## 2017-04-01 DIAGNOSIS — M25511 Pain in right shoulder: Secondary | ICD-10-CM

## 2017-04-01 NOTE — Therapy (Signed)
[name] [address], [place], [address] Phone: [phone]   Fax:  [phone]  Physical Therapy Treatment  Patient Details  Name: Bridget [name] MRN: [id number] Date of Birth: 20-Jan-1956 Referring Provider: Thornton Park MD  Encounter Date: 04/01/2017      PT End of Session - 04/01/17 1204    Visit Number 2   Number of Visits 16   Date for PT Re-Evaluation 05/23/17   PT Start Time 1129   PT Stop Time 1145   PT Time Calculation (min) 16 min   Activity Tolerance Patient tolerated treatment well;Patient limited by pain   Behavior During Therapy [facility] for tasks assessed/performed      Past Medical History:  Diagnosis Date  . 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 Chronic back pain     Past Surgical History:  Procedure Laterality Date  . ANKLE SURGERY Right   . CESAREAN SECTION  1984 and 1986   hodp pre term labor CS  . OVARIAN CYST SURGERY  1973  . SHOULDER ARTHROSCOPY WITH OPEN ROTATOR CUFF REPAIR AND DISTAL CLAVICLE ACROMINECTOMY Right 03/19/2017   Procedure: SHOULDER ARTHROSCOPY WITH OPEN ROTATOR CUFF REPAIR AND SUBACROMINAL DECOMPRESSION;  Surgeon: Thornton Park, MD;  Location: [facility] ORS;  Service: Orthopedics;  Laterality: Right;  . TUBAL LIGATION  1999  . WRIST GANGLION EXCISION      There were no vitals filed for this visit.      Subjective Assessment - 04/01/17 1157    Subjective Patient reports she has been doing exercises as instructed and her pain is decreasing overall. she still has pain with moving her arm.    Patient is accompained by: Family member   Pertinent History May 2017 lifted a case of water and felt a pain in right shoulder. She had PT prior to surgery without lasting results and then had MRI and underwent RTC repair 03/19/2017. She also has back pain.    Limitations Lifting;House hold activities;Other (comment)  work   Patient Stated Goals return to full  functional use right UE to return to prior level of function   Currently in Pain? Yes   Pain Score 2    Pain Location Shoulder   Pain Orientation Right   Pain Descriptors / Indicators Aching   Pain Type Acute pain;Surgical pain   Pain Onset 1 to 4 weeks ago   Pain Frequency Intermittent      Objective: Observation: patient arrived in clinic with sling and pillow in place, forward position of right shoulder Palpation; right upper trapezius with mild increased tone  Treatment:  Therapeutic exercise: Patient performed exercises with verbal, tactile cues and demonstration; goal improve ROM, pain PROM right shoulder forward elevation, ER and IR in plane of scapula 2 x 5-10 reps each with focus on relaxation during movement; 90/95 degrees forward elevation and up to 10 ER, IR with repetition AAROM: right elbow, forearm x 10 reps AROM: scapular retraction 2 x 10 reps between repetitions of PROM  Patient response to treatment: Patient demonstrated improved flexibility in right shoulder with decreased pain during movement at end of session and able to perform with more relaxed muscles during session. Patient demonstrated improved posture and scapular control with repetition.          PT Education - 04/01/17 1202    Education provided Yes   Education Details HEP; continue previous exercises and work on posture to keep right shoulder back in  neutral position   Person(s) Educated Patient   Methods Explanation;Demonstration;Verbal cues   Comprehension Verbalized understanding;Verbal cues required;Returned demonstration             PT Long Term Goals - 03/28/17 1200      PT LONG TERM GOAL #1   Title Patient will demonstrate improved functional use right UE with decreased pain to mild with QuickDash score of 40% or less by 05/23/2017   Baseline QuickDash 80%   Status New     PT LONG TERM GOAL #2   Title Patient will be independent with home program for pain control, flexibiltiy and  strength by 05/23/2017 to allow self management following discharge from physical therapy    Baseline limited knowlege of appropriate pain control strategies, exercises and progression without moderate assistance   Status New               Plan - 04/01/17 1204    Clinical Impression Statement Patient demonstrates improvement with ability to relax with exercises with decreasing pain. She has improved to 90/95 degrees forward elevation and 10 degrees ER, IR with pain limiting further motion. She should continue to progress with ROM, pain control with additional physical therapy intervention.    Rehab Potential Good   Clinical Impairments Affecting Rehab Potential (+)acute condition, motivated, prior level of function (-) chronic condition prior to surgery   PT Frequency 2x / week   PT Duration 8 weeks   PT Treatment/Interventions Patient/family education;Electrical Stimulation;Cryotherapy;Moist Heat;Neuromuscular re-education;Therapeutic exercise;Manual techniques   PT Next Visit Plan therapeutic exercise, pain control   PT Home Exercise Plan scapular retraction, active motion right elbow, forearm wrist, hand   Consulted and Agree with Plan of Care Patient      Patient will benefit from skilled therapeutic intervention in order to improve the following deficits and impairments:  Decreased strength, Pain, Impaired perceived functional ability, Decreased activity tolerance, Decreased endurance, Increased muscle spasms, Impaired UE functional use, Decreased range of motion  Visit Diagnosis: Acute pain of right shoulder  Muscle weakness (generalized)     Problem List Patient Active Problem List   Diagnosis Date Noted  . Pre-operative examination 02/21/2017  . Low back pain 06/11/2016  . Osteopenia 09/30/2013  . Encounter for routine gynecological examination 01/18/2012  . Routine general medical examination at a health care facility 01/09/2012  . [place], [place] 02/06/2010  .  DEGENERATIVE JOINT DISEASE, MILD 07/19/2009    Bridget [name] PT 04/01/2017, 12:07 PM  [name] [address] [place], [address] Phone: [phone]   Fax:  [phone]  Name: Bridget [name] MRN: [id number] Date of Birth: 01-08-1956

## 2017-04-04 ENCOUNTER — Encounter: Payer: Self-pay | Admitting: Physical Therapy

## 2017-04-04 ENCOUNTER — Ambulatory Visit: Payer: 59 | Admitting: Physical Therapy

## 2017-04-04 DIAGNOSIS — M25511 Pain in right shoulder: Secondary | ICD-10-CM | POA: Diagnosis not present

## 2017-04-04 DIAGNOSIS — M62838 Other muscle spasm: Secondary | ICD-10-CM | POA: Diagnosis not present

## 2017-04-04 DIAGNOSIS — M6281 Muscle weakness (generalized): Secondary | ICD-10-CM | POA: Diagnosis not present

## 2017-04-04 NOTE — Therapy (Signed)
[facility] [address], [place], [address] Phone: [phone]   Fax:  [phone]  Physical Therapy Treatment  Patient Details  Name: Bridget [name] MRN: [id number] Date of Birth: 05-27-1956 Referring Provider: Thornton Park MD  Encounter Date: 04/04/2017      PT End of Session - 04/04/17 1148    Visit Number 3   Number of Visits 16   Date for PT Re-Evaluation 05/23/17   PT Start Time 4765   PT Stop Time 1148   PT Time Calculation (min) 32 min   Activity Tolerance Patient tolerated treatment well;Patient limited by pain   Behavior During Therapy [facility] for tasks assessed/performed      Past Medical History:  Diagnosis Date  . 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 Chronic back pain     Past Surgical History:  Procedure Laterality Date  . ANKLE SURGERY Right   . CESAREAN SECTION  1984 and 1986   hodp pre term labor CS  . OVARIAN CYST SURGERY  1973  . SHOULDER ARTHROSCOPY WITH OPEN ROTATOR CUFF REPAIR AND DISTAL CLAVICLE ACROMINECTOMY Right 03/19/2017   Procedure: SHOULDER ARTHROSCOPY WITH OPEN ROTATOR CUFF REPAIR AND SUBACROMINAL DECOMPRESSION;  Surgeon: Thornton Park, MD;  Location: [facility] ORS;  Service: Orthopedics;  Laterality: Right;  . TUBAL LIGATION  1999  . WRIST GANGLION EXCISION      There were no vitals filed for this visit.      Subjective Assessment - 04/04/17 1123    Subjective Patient reports she had increased soreness Tuesday afternoon following previous session on Monday. She has not been putting ice on her shoulder regularly.    Pertinent History May 2017 lifted a case of water and felt a pain in right shoulder. She had PT prior to surgery without lasting results and then had MRI and underwent RTC repair 03/19/2017. She also has back pain.    Limitations Lifting;House hold activities;Other (comment)  Work   Patient Stated Goals return to full functional use right UE to  return to prior level of function   Currently in Pain? Yes   Pain Score 3    Pain Location Shoulder   Pain Orientation Right   Pain Descriptors / Indicators Aching;Sore   Pain Type Acute pain;Surgical pain  03/19/2017   Pain Onset 1 to 4 weeks ago   Pain Frequency Intermittent        Objective: Observation: patient arrived in clinic with sling and pillow in place, forward position of right shoulder Palpation; right upper trapezius with mild increased tone, pectoral muscle with increased tone, spasms, upper arm increased tenderness to palpation  Treatment:  Manual therapy; STM performed x 13 min. To right shoulder, upper trapezius, pectoral muscle and upper arm superficial techniques, compression over spasms in pectoral muscles; goals: spasms, pain, improve ROM  Therapeutic exercise: Patient performed exercises with verbal, tactile cues and demonstration; goal improve ROM, pain PROM right shoulder forward elevation, ER and IR in plane of scapula 2 x 5-10 reps each with focus on relaxation during movement; 80 - 95 degrees forward elevation and up to 10 ER, IR with repetition AAROM: right elbow, forearm x 10 reps AROM: scapular retraction 5-10 reps between repetitions of PROM; tactile cues required for controlled muscle activation ~50% of maximal force with decreased pain reported in shoulder Modalities: Moist heat (unbilled) applied to cervical spine, upper trapezius, back pre and during ROM exercise right shoulder; no adverse reaction noted  following treatment  Patient response to treatment: Patient demonstrated improved PROM right shoulder elevation with repetition, use of heat to relax muscles and VC. Patient demonstrated improved motor control and activation of scapular muscles with VC and repetition.        PT Education - 04/04/17 1130    Education provided Yes   Education Details HEP: re assessed scapular retraction with decresaed intensity and concentration on activating  muscles with good motor control; educated in use of ice to control pain and to use more often as needed   Person(s) Educated Patient   Methods Explanation;Demonstration;Tactile cues   Comprehension Verbalized understanding;Returned demonstration             PT Long Term Goals - 03/28/17 1200      PT LONG TERM GOAL #1   Title Patient will demonstrate improved functional use right UE with decreased pain to mild with QuickDash score of 40% or less by 05/23/2017   Baseline QuickDash 80%   Status New     PT LONG TERM GOAL #2   Title Patient will be independent with home program for pain control, flexibiltiy and strength by 05/23/2017 to allow self management following discharge from physical therapy    Baseline limited knowlege of appropriate pain control strategies, exercises and progression without moderate assistance   Status New               Plan - 04/04/17 1152    Clinical Impression Statement Patient demonstrated improved abiltiy to relax and demonstrated improved motor control with scapular retraction with tactile cues, verbal cues and repetition. She conitnues with pain s/p surgery and decreased strength and ROM and will require additional physical therapy intervention to progress and achieve goals.    Rehab Potential Good   Clinical Impairments Affecting Rehab Potential (+)acute condition, motivated, prior level of function (-) chronic condition prior to surgery   PT Frequency 2x / week   PT Duration 8 weeks   PT Treatment/Interventions Patient/family education;Electrical Stimulation;Cryotherapy;Moist Heat;Neuromuscular re-education;Therapeutic exercise;Manual techniques   PT Next Visit Plan therapeutic exercise, pain control   PT Home Exercise Plan scapular retraction, active motion right elbow, forearm wrist, hand   Consulted and Agree with Plan of Care Patient      Patient will benefit from skilled therapeutic intervention in order to improve the following deficits  and impairments:  Decreased strength, Pain, Impaired perceived functional ability, Decreased activity tolerance, Decreased endurance, Increased muscle spasms, Impaired UE functional use, Decreased range of motion  Visit Diagnosis: Acute pain of right shoulder  Muscle weakness (generalized)     Problem List Patient Active Problem List   Diagnosis Date Noted  . Pre-operative examination 02/21/2017  . Low back pain 06/11/2016  . Osteopenia 09/30/2013  . Encounter for routine gynecological examination 01/18/2012  . Routine general medical examination at a health care facility 01/09/2012  . [place], [place] 02/06/2010  . DEGENERATIVE JOINT DISEASE, MILD 07/19/2009    Jomarie Longs PT 04/05/2017, 3:21 PM  [facility] [address] [place], [address] Phone: [phone]   Fax:  [phone]  Name: PEGGI YONO MRN: [id number] Date of Birth: 07-14-1956

## 2017-04-08 ENCOUNTER — Ambulatory Visit: Payer: 59 | Admitting: Physical Therapy

## 2017-04-08 ENCOUNTER — Encounter: Payer: Self-pay | Admitting: Physical Therapy

## 2017-04-08 DIAGNOSIS — M6281 Muscle weakness (generalized): Secondary | ICD-10-CM | POA: Diagnosis not present

## 2017-04-08 DIAGNOSIS — M25511 Pain in right shoulder: Secondary | ICD-10-CM

## 2017-04-08 DIAGNOSIS — M62838 Other muscle spasm: Secondary | ICD-10-CM | POA: Diagnosis not present

## 2017-04-08 NOTE — Therapy (Signed)
[facility] [address] [place], [address] Phone: [phone]   Fax:  [phone]  Physical Therapy Treatment  Patient Details  Name: Bridget [name] MRN: [id number] Date of Birth: 20-Nov-1956 Referring Provider: Thornton Park MD  Encounter Date: 04/08/2017      PT End of Session - 04/08/17 1145    Visit Number 4   Number of Visits 16   Date for PT Re-Evaluation 05/23/17   PT Start Time 1114   PT Stop Time 1143   PT Time Calculation (min) 29 min   Activity Tolerance Patient tolerated treatment well;Patient limited by pain   Behavior During Therapy [facility] for tasks assessed/performed      Past Medical History:  Diagnosis Date  . 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 Chronic back pain     Past Surgical History:  Procedure Laterality Date  . ANKLE SURGERY Right   . CESAREAN SECTION  1984 and 1986   hodp pre term labor CS  . OVARIAN CYST SURGERY  1973  . SHOULDER ARTHROSCOPY WITH OPEN ROTATOR CUFF REPAIR AND DISTAL CLAVICLE ACROMINECTOMY Right 03/19/2017   Procedure: SHOULDER ARTHROSCOPY WITH OPEN ROTATOR CUFF REPAIR AND SUBACROMINAL DECOMPRESSION;  Surgeon: Thornton Park, MD;  Location: [facility] ORS;  Service: Orthopedics;  Laterality: Right;  . TUBAL LIGATION  1999  . WRIST GANGLION EXCISION      There were no vitals filed for this visit.      Subjective Assessment - 04/08/17 1118    Subjective Patient reports decreassing pain in right shoulder at rest, increases with movement/therapy ROM.   Pertinent History May 2017 lifted a case of water and felt a pain in right shoulder. She had PT prior to surgery without lasting results and then had MRI and underwent RTC repair 03/19/2017. She also has back pain.    Limitations Lifting;House hold activities;Other (comment)  Work   Patient Stated Goals return to full functional use right UE to return to prior level of function   Currently in Pain? Yes   Pain Score 2    Pain Onset 1 to 4 weeks ago      Objective: Observation: patient arrived in clinic with sling and pillow in place, forward position of right shoulder, able to correct on VC Palpation; right upper trapezius with mild increased tone, pectoral muscle with increased tone, spasms PROM: right shoulder pre treatment flexion 90, ER 10 and IR across trunk with support of pillow  Treatment:  Manual therapy; Patient positioned supine with right UE supported by towels/pillows  STM performed x 10 min. To right shoulder, upper trapezius, pectoral muscle and upper arm superficial techniques, compression over spasms in pectoral muscles; goals: spasms, pain, improve ROM  Therapeutic exercise: Patient performed exercises with verbal, tactile cues and demonstration; goal improve ROM, pain Patient positioned supine with right UE supported by towels under upper arm/pillow to support forearm on trunk PROM right shoulder, in plane of scapula, forward elevation, ER and IR in plane of scapula 3 sets x 5-10 reps each with focus on relaxation during movement; 80 to 100 degrees forward elevation and up to 20 ER, 30 IR with repetition AAROM: right elbow, forearm x 10 reps AROM: scapular retraction 5-10 reps between repetitions of PROM  Modalities: Moist heat (unbilled) applied to cervical spine, upper trapezius, back pre and during ROM exercise right shoulder; no adverse reaction noted following treatment  Patient response to treatment: patient demonstrated improved technique with  exercises and increased ability to relax with minimal VC and repetition. Patient with decreased spasms and improved soft tissue elasticity in upper trapezius by >50% and pectoral muscles by 30% following STM. Improved motor control with repetition and cuing        PT Education - 04/08/17 1155    Education provided Yes   Education Details HEP re assessed positioning of right arm for lying, sitting, scapular retraction  exercises; discussed progress and ROM goals   Person(s) Educated Patient   Methods Explanation   Comprehension Verbalized understanding             PT Long Term Goals - 03/28/17 1200      PT LONG TERM GOAL #1   Title Patient will demonstrate improved functional use right UE with decreased pain to mild with QuickDash score of 40% or less by 05/23/2017   Baseline QuickDash 80%   Status New     PT LONG TERM GOAL #2   Title Patient will be independent with home program for pain control, flexibiltiy and strength by 05/23/2017 to allow self management following discharge from physical therapy    Baseline limited knowlege of appropriate pain control strategies, exercises and progression without moderate assistance   Status New               Plan - 04/08/17 1145    Clinical Impression Statement Patient demonstrates improvement in PROM from 90 to 100 degrees flexion and up to 20 degrees ER, spasms and pain limit further motion. She should continue to improve with additional physical therapy intervention as she heals from recent surgery.   Rehab Potential Good   Clinical Impairments Affecting Rehab Potential (+)acute condition, motivated, prior level of function (-) chronic condition prior to surgery   PT Frequency 2x / week   PT Duration 8 weeks   PT Treatment/Interventions Patient/family education;Electrical Stimulation;Cryotherapy;Moist Heat;Neuromuscular re-education;Therapeutic exercise;Manual techniques   PT Next Visit Plan therapeutic exercise, pain control   PT Home Exercise Plan scapular retraction, active motion right elbow, forearm wrist, hand   Consulted and Agree with Plan of Care Patient      Patient will benefit from skilled therapeutic intervention in order to improve the following deficits and impairments:  Decreased strength, Pain, Impaired perceived functional ability, Decreased activity tolerance, Decreased endurance, Increased muscle spasms, Impaired UE  functional use, Decreased range of motion  Visit Diagnosis: Acute pain of right shoulder  Muscle weakness (generalized)     Problem List Patient Active Problem List   Diagnosis Date Noted  . Pre-operative examination 02/21/2017  . Low back pain 06/11/2016  . Osteopenia 09/30/2013  . Encounter for routine gynecological examination 01/18/2012  . Routine general medical examination at a health care facility 01/09/2012  . [place], [place] 02/06/2010  . DEGENERATIVE JOINT DISEASE, MILD 07/19/2009    Aldona Lento 04/08/2017, 11:56 AM  [facility] [address], [place], [address] Phone: [phone]   Fax:  [phone]  Name: Bridget [name] MRN: [id number] Date of Birth: 1956-03-30

## 2017-04-09 ENCOUNTER — Encounter: Payer: 59 | Admitting: Physical Therapy

## 2017-04-12 ENCOUNTER — Ambulatory Visit: Payer: 59 | Admitting: Physical Therapy

## 2017-04-12 ENCOUNTER — Encounter: Payer: Self-pay | Admitting: Physical Therapy

## 2017-04-12 DIAGNOSIS — M6281 Muscle weakness (generalized): Secondary | ICD-10-CM | POA: Diagnosis not present

## 2017-04-12 DIAGNOSIS — M62838 Other muscle spasm: Secondary | ICD-10-CM

## 2017-04-12 DIAGNOSIS — M25511 Pain in right shoulder: Secondary | ICD-10-CM | POA: Diagnosis not present

## 2017-04-13 NOTE — Therapy (Signed)
[facility] [address] [place], [address] Phone: [phone]   Fax:  [phone]  Physical Therapy Treatment  Patient Details  Name: Bridget [name] MRN: [id number] Date of Birth: 02-18-1956 Referring Provider: Thornton Park MD  Encounter Date: 04/12/2017      PT End of Session - 04/12/17 1120    Visit Number 5   Number of Visits 16   Date for PT Re-Evaluation 05/23/17   PT Start Time 1120   PT Stop Time 1158   PT Time Calculation (min) 38 min   Activity Tolerance Patient tolerated treatment well;Patient limited by pain   Behavior During Therapy [facility] for tasks assessed/performed      Past Medical History:  Diagnosis Date  . 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 Chronic back pain     Past Surgical History:  Procedure Laterality Date  . ANKLE SURGERY Right   . CESAREAN SECTION  1984 and 1986   hodp pre term labor CS  . OVARIAN CYST SURGERY  1973  . SHOULDER ARTHROSCOPY WITH OPEN ROTATOR CUFF REPAIR AND DISTAL CLAVICLE ACROMINECTOMY Right 03/19/2017   Procedure: SHOULDER ARTHROSCOPY WITH OPEN ROTATOR CUFF REPAIR AND SUBACROMINAL DECOMPRESSION;  Surgeon: Thornton Park, MD;  Location: [facility] ORS;  Service: Orthopedics;  Laterality: Right;  . TUBAL LIGATION  1999  . WRIST GANGLION EXCISION      There were no vitals filed for this visit.      Subjective Assessment - 04/12/17 1133    Subjective Patient reports decreassing pain in shoulder and mild discomfort. She is trying to use arm in sling for hair care, eating and holding plate with both hands etc. And this may be contributing to increased soreness in forearm, shoulder. She reports she is to see Dr. Mack Guise Monday and is planning on returning to work soon if possible.    Pertinent History May 2017 lifted a case of water and felt a pain in right shoulder. She had PT prior to surgery without lasting results and then had MRI and underwent RTC  repair 03/19/2017. She also has back pain.    Limitations Lifting;House hold activities;Other (comment)  Work   Patient Stated Goals return to full functional use right UE to return to prior level of function   Currently in Pain? Yes   Pain Score 2    Pain Location Shoulder   Pain Orientation Right   Pain Descriptors / Indicators Aching;Sore   Pain Type Surgical pain  03/19/2017   Pain Onset 1 to 4 weeks ago      Objective: Observation: patient arrived in clinic with sling and pillow in place, forward position of right shoulder, able to correct on VC Palpation; right upper trapezius with mild increased tone, pectoral muscle with increased tone, spasms, medial border right scapula with multiple spasms and right forearm extensors with spasms  Treatment:  Manual therapy; Patient positioned supine with right UE supported by towels/pillows  STM performed x 10 min. To right shoulder, upper trapezius, pectoral muscle and forearm muscles superficial techniques, compression over spasms in pectoral muscles and forearm; goals: spasms, pain, improve ROM  Therapeutic exercise: Patient performed exercises with verbal, tactile cues and demonstration; goal improve ROM, pain Sitting/standing: shoulder shrugs x 10 with concentration on relaxing between each repetition Patient positioned supine with right UE supported by towels under upper arm/pillow to support forearm on trunk PROM right shoulder, in plane of scapula, forward elevation, ER and IR  in plane of scapula 3 sets x 5-10 reps each with focus on relaxation during movement; 80 to 95degrees (note: was up to 100 degrees previous session) forward elevation and up to 20 ER, 30 IR with repetition AAROM: right elbow, forearm x 10 reps AROM: scapular retraction 5-10 reps between repetitions of PROM  Modalities: Moist heat (unbilled) applied to cervical spine, upper trapezius, back pre and during ROM exercise right shoulder; no adverse reaction noted  following treatment  Patient response to treatment: Patient demonstrated improved PROM with improved ability to relax during session as compared to previous session. She continued with pain which limited further ROM today. Improved soft tissue elasticity and decreased pain/tenderness to mild following STM to periscapular muscles.           PT Education - 04/12/17 1135    Education provided Yes   Education Details HEP: added shoulder shrugs and self massage to right elbow spasms/tender TrPs; encouraged patient to not lift, use right UE for daily tasks until follow up with physician   Person(s) Educated Patient   Methods Explanation;Demonstration;Verbal cues   Comprehension Verbalized understanding;Returned demonstration;Verbal cues required             PT Long Term Goals - 03/28/17 1200      PT LONG TERM GOAL #1   Title Patient will demonstrate improved functional use right UE with decreased pain to mild with QuickDash score of 40% or less by 05/23/2017   Baseline QuickDash 80%   Status New     PT LONG TERM GOAL #2   Title Patient will be independent with home program for pain control, flexibiltiy and strength by 05/23/2017 to allow self management following discharge from physical therapy    Baseline limited knowlege of appropriate pain control strategies, exercises and progression without moderate assistance   Status New               Plan - 04/12/17 1200    Clinical Impression Statement Patient is improving slowly with PROM with ROM to 95 degrees today due to pain and spasms s/p shoulder surgery. She should continue to improve as she is able to wean out of sling and begin more active assisted and active motion with continued guidance of physical therapy intervention. She may benefit from addition of electrical stimulation to assist with spasms /pain and muscle re education of peri scapulat muscles.    Rehab Potential Good   Clinical Impairments Affecting Rehab  Potential (+)acute condition, motivated, prior level of function (-) chronic condition prior to surgery   PT Frequency 2x / week   PT Duration 8 weeks   PT Treatment/Interventions Patient/family education;Electrical Stimulation;Cryotherapy;Moist Heat;Neuromuscular re-education;Therapeutic exercise;Manual techniques   PT Next Visit Plan therapeutic exercise, pain control; electrical stimulation   PT Home Exercise Plan scapular retraction, active motion right elbow, forearm wrist, hand   Consulted and Agree with Plan of Care Patient      Patient will benefit from skilled therapeutic intervention in order to improve the following deficits and impairments:  Decreased strength, Pain, Impaired perceived functional ability, Decreased activity tolerance, Decreased endurance, Increased muscle spasms, Impaired UE functional use, Decreased range of motion  Visit Diagnosis: Acute pain of right shoulder  Muscle weakness (generalized)  Other muscle spasm     Problem List Patient Active Problem List   Diagnosis Date Noted  . Pre-operative examination 02/21/2017  . Low back pain 06/11/2016  . Osteopenia 09/30/2013  . Encounter for routine gynecological examination 01/18/2012  . Routine general  medical examination at a health care facility 01/09/2012  . [place], [place] 02/06/2010  . DEGENERATIVE JOINT DISEASE, MILD 07/19/2009    Jomarie Longs PT 04/13/2017, 10:38 AM  [facility] [address] [place], [address] Phone: [phone]   Fax:  [phone]  Name: Bridget [name] MRN: [id number] Date of Birth: [birth year]

## 2017-04-15 ENCOUNTER — Ambulatory Visit: Payer: 59 | Admitting: Physical Therapy

## 2017-04-15 ENCOUNTER — Encounter: Payer: Self-pay | Admitting: Physical Therapy

## 2017-04-15 DIAGNOSIS — M6281 Muscle weakness (generalized): Secondary | ICD-10-CM

## 2017-04-15 DIAGNOSIS — M25511 Pain in right shoulder: Secondary | ICD-10-CM | POA: Diagnosis not present

## 2017-04-15 DIAGNOSIS — M62838 Other muscle spasm: Secondary | ICD-10-CM | POA: Diagnosis not present

## 2017-04-15 DIAGNOSIS — M75111 Incomplete rotator cuff tear or rupture of right shoulder, not specified as traumatic: Secondary | ICD-10-CM | POA: Diagnosis not present

## 2017-04-15 NOTE — Therapy (Signed)
[facility] [address], [place], [address] Phone: [phone]   Fax:  [phone]  Physical Therapy Treatment  Patient Details  Name: Bridget [name] MRN: [id number] Date of Birth: 1956-09-21 Referring Provider: Thornton Park MD  Encounter Date: 04/15/2017      PT End of Session - 04/15/17 1820    Visit Number 6   Number of Visits 16   Date for PT Re-Evaluation 05/23/17   PT Start Time 2831   PT Stop Time 1815   PT Time Calculation (min) 42 min   Activity Tolerance Patient tolerated treatment well;Patient limited by pain   Behavior During Therapy [facility] for tasks assessed/performed      Past Medical History:  Diagnosis Date  . 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 Chronic back pain     Past Surgical History:  Procedure Laterality Date  . ANKLE SURGERY Right   . CESAREAN SECTION  1984 and 1986   hodp pre term labor CS  . OVARIAN CYST SURGERY  1973  . SHOULDER ARTHROSCOPY WITH OPEN ROTATOR CUFF REPAIR AND DISTAL CLAVICLE ACROMINECTOMY Right 03/19/2017   Procedure: SHOULDER ARTHROSCOPY WITH OPEN ROTATOR CUFF REPAIR AND SUBACROMINAL DECOMPRESSION;  Surgeon: Thornton Park, MD;  Location: [facility] ORS;  Service: Orthopedics;  Laterality: Right;  . TUBAL LIGATION  1999  . WRIST GANGLION EXCISION      There were no vitals filed for this visit.      Subjective Assessment - 04/15/17 1733    Subjective Patient reports she is now out of sling, progressing steadily, slowly and is more sore today than the other day. She is planning to return to work this week Thursday.   Pertinent History May 2017 lifted a case of water and felt a pain in right shoulder. She had PT prior to surgery without lasting results and then had MRI and underwent RTC repair 03/19/2017. She also has back pain.    Limitations Lifting;House hold activities;Other (comment)  Work   Patient Stated Goals return to full functional use right  UE to return to prior level of function   Currently in Pain? Yes   Pain Score 4    Pain Location Shoulder   Pain Orientation Right   Pain Descriptors / Indicators Aching;Sore;Spasm   Pain Type Surgical pain  03/19/2017   Pain Onset 1 to 4 weeks ago   Pain Frequency Intermittent      Objective: Observation: patient arrived in clinic without sling on today with arm held in guarded position at side with right shoulder elevated as compared to left AAROM: right shoulder in sitting 80 degrees forward elevation; ER on pillow 20 degrees  Treatment: Manual therapy:  With patient seated with pillow supporting right UE: therapist performed superficial soft tissue mobilization to right upper trapezius, cervical spine musculature, scalenes and along thoracic spine, paraspinal muscles x 10 min. Goal: decrease spasms, pain, improve soft tissue elasticity  Therapeutic exercise: patient performed with assistance, demonstration and VC of therapist: Sitting; AAROM right UE ER/IR x 10 with UE supported on pillow in plane of scapula Instructed in AAROM with clasped hands;patient performed following demonstration; raise hands 5x to chin, nose and forehead with difficulty and unable to reach forehead Standing:  at treatment table: right hand on ball for AAROM forward flexion x 15 reps, rotations on ball x 15 reps with assistance to stabilize and align shoulder properly, back and down Instructed in proper technique for pendulum  exercise with demonstration  Modalities:  Electrical stimulation: High volt estim.clincial program for muscle spasms  (2) electrodes applied to right shoulder upper trapezius and mid thoracic spine medial to scapula, intensity to tolerance with patient seated and right UE supported on pillow with moist heat applied to same; goal: pain, spasms; no adverse reactions noted  Patient response to treatment: patient demonstrated improved technique with exercises with minimal VC and tactile  cuing for correct alignment. Patient with decreased pain from 4/10 to 2/10 following electrical stimulation/heat. Patient with decreased spasms by 50% following STM. Improved motor control with repetition and cuing for shoulder AAROM on ball.         PT Education - 04/15/17 1800    Education provided Yes   Education Details HEP: added ball on table forward flexion active assisted motion, ER/IR on ball, scapular retraction, stretch SCM/scalenes, pendulums   Person(s) Educated Patient   Methods Explanation;Demonstration;Verbal cues;Handout   Comprehension Verbalized understanding;Returned demonstration;Verbal cues required             PT Long Term Goals - 03/28/17 1200      PT LONG TERM GOAL #1   Title Patient will demonstrate improved functional use right UE with decreased pain to mild with QuickDash score of 40% or less by 05/23/2017   Baseline QuickDash 80%   Status New     PT LONG TERM GOAL #2   Title Patient will be independent with home program for pain control, flexibiltiy and strength by 05/23/2017 to allow self management following discharge from physical therapy    Baseline limited knowlege of appropriate pain control strategies, exercises and progression without moderate assistance   Status New               Plan - 04/15/17 1818    Clinical Impression Statement Patient is now out of sling and able to begin active assisted motion. She continues with pain as primary limiting factor and reported decreased pain at end of session. She should continue to progress with exercises and decrease pain with additional physical therapy intervention.    Rehab Potential Good   Clinical Impairments Affecting Rehab Potential (+)acute condition, motivated, prior level of function (-) chronic condition prior to surgery   PT Frequency 2x / week   PT Duration 8 weeks   PT Treatment/Interventions Patient/family education;Electrical Stimulation;Cryotherapy;Moist Heat;Neuromuscular  re-education;Therapeutic exercise;Manual techniques   PT Next Visit Plan therapeutic exercise, pain control; electrical stimulation   PT Home Exercise Plan scapular retraction, active motion right elbow, forearm wrist, hand      Patient will benefit from skilled therapeutic intervention in order to improve the following deficits and impairments:  Decreased strength, Pain, Impaired perceived functional ability, Decreased activity tolerance, Decreased endurance, Increased muscle spasms, Impaired UE functional use, Decreased range of motion  Visit Diagnosis: Acute pain of right shoulder  Muscle weakness (generalized)  Other muscle spasm     Problem List Patient Active Problem List   Diagnosis Date Noted  . Pre-operative examination 02/21/2017  . Low back pain 06/11/2016  . Osteopenia 09/30/2013  . Encounter for routine gynecological examination 01/18/2012  . Routine general medical examination at a health care facility 01/09/2012  . [place], [place] 02/06/2010  . DEGENERATIVE JOINT DISEASE, MILD 07/19/2009    Jomarie Longs PT 04/15/2017, 6:52 PM  [facility] [address] [place], [address] Phone: [phone]   Fax:  [phone]  Name: Bridget [name] MRN: [id number] Date of Birth:  05/14/1956   

## 2017-04-17 ENCOUNTER — Ambulatory Visit: Payer: 59 | Admitting: Physical Therapy

## 2017-04-17 ENCOUNTER — Encounter: Payer: Self-pay | Admitting: Physical Therapy

## 2017-04-17 DIAGNOSIS — M25511 Pain in right shoulder: Secondary | ICD-10-CM

## 2017-04-17 DIAGNOSIS — M62838 Other muscle spasm: Secondary | ICD-10-CM | POA: Diagnosis not present

## 2017-04-17 DIAGNOSIS — M6281 Muscle weakness (generalized): Secondary | ICD-10-CM

## 2017-04-18 NOTE — Therapy (Signed)
[facility] [address] [place], [address] Phone: [phone]   Fax:  [phone]  Physical Therapy Treatment  Patient Details  Name: Bridget [name] MRN: [id number] Date of Birth: Oct 09, 1956 Referring Provider: Thornton Park MD  Encounter Date: 04/17/2017      PT End of Session - 04/17/17 1830    Visit Number 7   Number of Visits 16   Date for PT Re-Evaluation 05/23/17   PT Start Time 7408   PT Stop Time 1830   PT Time Calculation (min) 46 min   Activity Tolerance Patient tolerated treatment well;Patient limited by pain   Behavior During Therapy [facility] for tasks assessed/performed      Past Medical History:  Diagnosis Date  . 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 Chronic back pain     Past Surgical History:  Procedure Laterality Date  . ANKLE SURGERY Right   . CESAREAN SECTION  1984 and 1986   hodp pre term labor CS  . OVARIAN CYST SURGERY  1973  . SHOULDER ARTHROSCOPY WITH OPEN ROTATOR CUFF REPAIR AND DISTAL CLAVICLE ACROMINECTOMY Right 03/19/2017   Procedure: SHOULDER ARTHROSCOPY WITH OPEN ROTATOR CUFF REPAIR AND SUBACROMINAL DECOMPRESSION;  Surgeon: Thornton Park, MD;  Location: [facility] ORS;  Service: Orthopedics;  Laterality: Right;  . TUBAL LIGATION  1999  . WRIST GANGLION EXCISION      There were no vitals filed for this visit.      Subjective Assessment - 04/17/17 1748    Subjective Patient reports primary pain is in biceps, upper arm right UE. She is exerciseing as instructed and reports the electrical stimulation did help with her pain and she was able to sleep better.    Pertinent History May 2017 lifted a case of water and felt a pain in right shoulder. She had PT prior to surgery without lasting results and then had MRI and underwent RTC repair 03/19/2017. She also has back pain.    Limitations Lifting;House hold activities;Other (comment)  Work   Patient Stated Goals return to  full functional use right UE to return to prior level of function   Currently in Pain? Yes   Pain Score 4    Pain Location Shoulder   Pain Orientation Right   Pain Descriptors / Indicators Aching;Sore   Pain Type Surgical pain  03/19/2017   Pain Onset 1 to 4 weeks ago   Pain Frequency Intermittent      Objective: Posture; right shoulder elevated as compared to left, guarded  Palpation: increased tone, spasms palpable along right upper trapezius, right biceps, with hyper sensitivity  Treatment: Manual therapy:  With patient seated with pillow supporting right UE and with patient supine: therapist performed superficial soft tissue mobilization to right upper trapezius, cervical spine musculature, scalenes and suboccipital release and upper trapezius sretching, paraspinal muscles x 10 min. Goal: decrease spasms, pain, improve soft tissue elasticity  Therapeutic exercise: patient performed with assistance, demonstration and VC of therapist: Supine and sitting: AAROM right UE forward elevation,  ER/IR x 10 with UE supported on pillow in plane of scapula  Modalities:  Electrical stimulation: Russian stim. Applied to medial border of right scapula and lower trapezius for muscle re education, High volt estim.clincial program for muscle spasms  (2) electrodes applied to right shoulder anterior and posterior aspect right shoulder/biceps, intensity to tolerance with patient seated and right UE supported on pillow with moist heat applied to same; goal: pain, spasms;  no adverse reactions noted  Patient response to treatment: Patient with decreased spasms and improved soft tissue elasticity by 50% with STM. Patient was limited in ROM right shoulder due to pain. Patient reported decreased pain at end of session and more relaxed following estim/moist heat.           PT Education - 04/17/17 1820    Education provided Yes   Education Details instruction for perfomring exercises properly    Person(s) Educated Patient   Methods Explanation;Verbal cues   Comprehension Verbalized understanding;Verbal cues required             PT Long Term Goals - 03/28/17 1200      PT LONG TERM GOAL #1   Title Patient will demonstrate improved functional use right UE with decreased pain to mild with QuickDash score of 40% or less by 05/23/2017   Baseline QuickDash 80%   Status New     PT LONG TERM GOAL #2   Title Patient will be independent with home program for pain control, flexibiltiy and strength by 05/23/2017 to allow self management following discharge from physical therapy    Baseline limited knowlege of appropriate pain control strategies, exercises and progression without moderate assistance   Status New               Plan - 04/17/17 1833    Clinical Impression Statement Patient continues with pain and limited ROM which limits functional use right UE for ADLs. She is responding well to current treatment with decreasing pain and improving ROM within protocol s/p shoulder surgery. She should continue to improve with additional physical therapy intervention.    Rehab Potential Good   Clinical Impairments Affecting Rehab Potential (+)acute condition, motivated, prior level of function (-) chronic condition prior to surgery   PT Frequency 2x / week   PT Duration 8 weeks   PT Treatment/Interventions Patient/family education;Electrical Stimulation;Cryotherapy;Moist Heat;Neuromuscular re-education;Therapeutic exercise;Manual techniques   PT Next Visit Plan therapeutic exercise, pain control; electrical stimulation   PT Home Exercise Plan scapular retraction, active motion right elbow, forearm wrist, hand      Patient will benefit from skilled therapeutic intervention in order to improve the following deficits and impairments:  Decreased strength, Pain, Impaired perceived functional ability, Decreased activity tolerance, Decreased endurance, Increased muscle spasms, Impaired UE  functional use, Decreased range of motion  Visit Diagnosis: Acute pain of right shoulder  Muscle weakness (generalized)  Other muscle spasm     Problem List Patient Active Problem List   Diagnosis Date Noted  . Pre-operative examination 02/21/2017  . Low back pain 06/11/2016  . Osteopenia 09/30/2013  . Encounter for routine gynecological examination 01/18/2012  . Routine general medical examination at a health care facility 01/09/2012  . [place], [place] 02/06/2010  . DEGENERATIVE JOINT DISEASE, MILD 07/19/2009    Jomarie Longs PT 04/18/2017, 9:56 PM  [facility] [address] [place], [address] Phone: [phone]   Fax:  [phone]  Name: AUBERY DATE MRN: [id number] Date of Birth: [birth year]

## 2017-04-22 ENCOUNTER — Encounter: Payer: Self-pay | Admitting: Physical Therapy

## 2017-04-22 ENCOUNTER — Ambulatory Visit: Payer: 59 | Admitting: Physical Therapy

## 2017-04-22 DIAGNOSIS — M25511 Pain in right shoulder: Secondary | ICD-10-CM | POA: Diagnosis not present

## 2017-04-22 DIAGNOSIS — M6281 Muscle weakness (generalized): Secondary | ICD-10-CM

## 2017-04-22 DIAGNOSIS — M62838 Other muscle spasm: Secondary | ICD-10-CM | POA: Diagnosis not present

## 2017-04-22 NOTE — Therapy (Addendum)
[place] [address], [place], [address] Phone: [phone]   Fax:  [phone]  Physical Therapy Treatment  Patient Details  Name: Bridget [name] MRN: [id number] Date of Birth: 10/04/1956 Referring Provider: Thornton Park MD  Encounter Date: 04/22/2017      PT End of Session - 04/22/17 2043    Visit Number 8   Number of Visits 16   Date for PT Re-Evaluation 05/23/17   PT Start Time 1845   PT Stop Time 1928   PT Time Calculation (min) 43 min   Activity Tolerance Patient tolerated treatment well;Patient limited by pain   Behavior During Therapy [facility] for tasks assessed/performed      Past Medical History:  Diagnosis Date  . 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 Chronic back pain     Past Surgical History:  Procedure Laterality Date  . ANKLE SURGERY Right   . CESAREAN SECTION  1984 and 1986   hodp pre term labor CS  . OVARIAN CYST SURGERY  1973  . SHOULDER ARTHROSCOPY WITH OPEN ROTATOR CUFF REPAIR AND DISTAL CLAVICLE ACROMINECTOMY Right 03/19/2017   Procedure: SHOULDER ARTHROSCOPY WITH OPEN ROTATOR CUFF REPAIR AND SUBACROMINAL DECOMPRESSION;  Surgeon: Thornton Park, MD;  Location: [facility] ORS;  Service: Orthopedics;  Laterality: Right;  . TUBAL LIGATION  1999  . WRIST GANGLION EXCISION      There were no vitals filed for this visit.      Subjective Assessment - 04/22/17 1846    Subjective Patient reports she is hurting more in right shoulder: planted some flowers with pushing hand into dirt with increased pain the next day. She has switched back to ibuprofen from meloxicam.    Pertinent History May 2017 lifted a case of water and felt a pain in right shoulder. She had PT prior to surgery without lasting results and then had MRI and underwent RTC repair 03/19/2017. She also has back pain.    Limitations Lifting;House hold activities;Other (comment)  Work   Patient Stated Goals return to  full functional use right UE to return to prior level of function   Currently in Pain? Yes   Pain Score 4    Pain Location Shoulder   Pain Orientation Right   Pain Descriptors / Indicators Aching;Throbbing   Pain Onset More than a month ago   Pain Frequency Intermittent        Objective: Posture; right arm at side, rounded forward as compared to left Palpation: right shoulder/UE with increased tone, spasms and tenderness along biceps muscle belly, upper trapezius  Treatment: Manual therapy:  With patient supine lying with right UE supported; therapist performed superficial soft tissue mobilization to right upper trapezius, cervical spine musculature, scalenes and paraspinal muscles x 42min. Goal: decrease spasms, pain, improve soft tissue elasticity  Therapeutic exercise: patient performed with assistance, demonstration and VC of therapist: Supine and sitting: AAROM right UE forward elevation,  ER/IR x 10 with UE supported on pillow in plane of scapula, multiple repetitions and sets as tolerated, required VC to relax during pain, muscle spasm  Modalities: Electrical stimulation: x 20 min.: Russian stim. Applied to medial border of right scapula and lower trapezius for muscle re education, High volt estim.clincial program for muscle spasms (2) electrodes applied to right shoulder anterior and posterior aspect right shoulder/biceps,intensity to tolerance with patient seated and right UE supported on pillow with moist heat applied to same; goal: pain, spasms; no adverse reactions  noted  Patient response to treatment: Patient able to tolerate increased ROM for right shoulder flexion, rotations with repetition; patient having difficulty with relaxing during ROM. Patient with decreased spasms and improved soft tissue elasticity by 50% following STM. Patient reported decreased soreness and pain in right shoulder following estim/moist heat treatment.         PT Education - 04/22/17  1925    Education provided Yes   Education Details instructed in pain control, rest, using right UE appropriately within guidelines s/p RTC surgery   Person(s) Educated Patient   Methods Explanation             PT Long Term Goals - 03/28/17 1200      PT LONG TERM GOAL #1   Title Patient will demonstrate improved functional use right UE with decreased pain to mild with QuickDash score of 40% or less by 05/23/2017   Baseline QuickDash 80%   Status New     PT LONG TERM GOAL #2   Title Patient will be independent with home program for pain control, flexibiltiy and strength by 05/23/2017 to allow self management following discharge from physical therapy    Baseline limited knowlege of appropriate pain control strategies, exercises and progression without moderate assistance   Status New               Plan - 04/22/17 1927    Clinical Impression Statement Patient continues with pain and limited ROM and strength s/p RTC repair with subacromial decompression x 5 weeks. She is responding favorably to current physical therpay intervention.    Rehab Potential Good   Clinical Impairments Affecting Rehab Potential (+)acute condition, motivated, prior level of function (-) chronic condition prior to surgery   PT Frequency 2x / week   PT Duration 8 weeks   PT Treatment/Interventions Patient/family education;Electrical Stimulation;Cryotherapy;Moist Heat;Neuromuscular re-education;Therapeutic exercise;Manual techniques   PT Next Visit Plan therapeutic exercise, pain control; electrical stimulation   PT Home Exercise Plan scapular retraction, active motion right elbow, forearm wrist, hand      Patient will benefit from skilled therapeutic intervention in order to improve the following deficits and impairments:  Decreased strength, Pain, Impaired perceived functional ability, Decreased activity tolerance, Decreased endurance, Increased muscle spasms, Impaired UE functional use, Decreased  range of motion  Visit Diagnosis: Acute pain of right shoulder  Muscle weakness (generalized)  Other muscle spasm     Problem List Patient Active Problem List   Diagnosis Date Noted  . Pre-operative examination 02/21/2017  . Low back pain 06/11/2016  . Osteopenia 09/30/2013  . Encounter for routine gynecological examination 01/18/2012  . Routine general medical examination at a health care facility 01/09/2012  . [place], [place] 02/06/2010  . DEGENERATIVE JOINT DISEASE, MILD 07/19/2009    Jomarie Longs PT 04/22/2017, 8:46 PM  [place] [facility] [address], [place], [address] Phone: [phone]   Fax:  [phone]  Name: CHRISTYANA CORWIN MRN: [id number] Date of Birth: 13-Feb-1956

## 2017-04-24 ENCOUNTER — Encounter: Payer: Self-pay | Admitting: Physical Therapy

## 2017-04-24 ENCOUNTER — Ambulatory Visit: Payer: 59 | Attending: Orthopedic Surgery | Admitting: Physical Therapy

## 2017-04-24 DIAGNOSIS — M62838 Other muscle spasm: Secondary | ICD-10-CM | POA: Diagnosis not present

## 2017-04-24 DIAGNOSIS — M6281 Muscle weakness (generalized): Secondary | ICD-10-CM | POA: Insufficient documentation

## 2017-04-24 DIAGNOSIS — M25511 Pain in right shoulder: Secondary | ICD-10-CM | POA: Diagnosis not present

## 2017-04-24 NOTE — Therapy (Signed)
[place] [facility] [address] [place], [address] Phone: [phone]   Fax:  [phone]  Physical Therapy Treatment  Patient Details  Name: Bridget [name] MRN: [id number] Date of Birth: June 29, 1956 Referring Provider: Thornton Park MD  Encounter Date: 04/24/2017      PT End of Session - 04/24/17 1919    Visit Number 9   Number of Visits 16   Date for PT Re-Evaluation 05/23/17   PT Start Time 1850   PT Stop Time 1942   PT Time Calculation (min) 52 min   Activity Tolerance Patient tolerated treatment well;Patient limited by pain   Behavior During Therapy [facility] for tasks assessed/performed      Past Medical History:  Diagnosis Date  . 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 Chronic back pain     Past Surgical History:  Procedure Laterality Date  . ANKLE SURGERY Right   . CESAREAN SECTION  1984 and 1986   hodp pre term labor CS  . OVARIAN CYST SURGERY  1973  . SHOULDER ARTHROSCOPY WITH OPEN ROTATOR CUFF REPAIR AND DISTAL CLAVICLE ACROMINECTOMY Right 03/19/2017   Procedure: SHOULDER ARTHROSCOPY WITH OPEN ROTATOR CUFF REPAIR AND SUBACROMINAL DECOMPRESSION;  Surgeon: Thornton Park, MD;  Location: [facility] ORS;  Service: Orthopedics;  Laterality: Right;  . TUBAL LIGATION  1999  . WRIST GANGLION EXCISION      There were no vitals filed for this visit.      Subjective Assessment - 04/24/17 1852    Subjective Patient reports she is improving with ROM, decreasing pain and sleeping better. She has been modifying work and doing okay with that.    Pertinent History May 2017 lifted a case of water and felt a pain in right shoulder. She had PT prior to surgery without lasting results and then had MRI and underwent RTC repair 03/19/2017. She also has back pain.    Limitations Lifting;House hold activities;Other (comment)  Work   Patient Stated Goals return to full functional use right UE to return to prior level of  function   Currently in Pain? Yes   Pain Score 2    Pain Location Shoulder   Pain Orientation Right   Pain Descriptors / Indicators Aching   Pain Type Surgical pain  03/19/2017   Pain Onset More than a month ago   Pain Frequency Intermittent        Objective:    AAROM: right shoulder supine 0-110 following treatment    Palpation: + spasms right upper trapezius, increased tenderness along right biceps, pectoral muscles and posterior aspect of right shoulder     Treatment: Manual therapy:  With patient supine lying with right UE supported; therapist performed superficial soft tissue mobilization to right upper trapezius, cervical spine musculature, scalenes and paraspinal muscles x 33min. Goal: decrease spasms, pain, improve soft tissue elasticity  Therapeutic exercise: patient performed with assistance, demonstration and VC of therapist: Supine and sitting: AAROM right UE forward elevation, ER/IR x 10 with UE supported by therapist in plane of scapula, multiple repetitions and sets as tolerated, required VC to relax during pain, muscle spasm Standing UE ranger (on floor) for forward flexion and rotations 2 x 10 reps  Modalities: Electrical stimulation: x 20 min.: Russian stim. Applied to medial border of right scapula and lower trapezius for muscle re education, High volt estim.clincial program for muscle spasms(2) electrodes applied to right shoulder anterior and posterior aspect right shoulder/biceps,intensity to tolerance with  patient seated and right UE supported on pillow with ice pack applied to same; goal: pain, spasms; no adverse reactions noted  Patient response to treatment: Patient demonstrated improved AAROM right shoulder with decreased spasms and pain. She required moderate assistance and VC to perform exercises. Improved soft tissue elasticity and decreased pain by 50% following STM and modalities.        PT Education - 04/24/17 1923    Education provided Yes    Education Details UE ranger exercises   Person(s) Educated Patient   Methods Explanation;Demonstration;Verbal cues   Comprehension Verbalized understanding;Returned demonstration;Verbal cues required             PT Long Term Goals - 03/28/17 1200      PT LONG TERM GOAL #1   Title Patient will demonstrate improved functional use right UE with decreased pain to mild with QuickDash score of 40% or less by 05/23/2017   Baseline QuickDash 80%   Status New     PT LONG TERM GOAL #2   Title Patient will be independent with home program for pain control, flexibiltiy and strength by 05/23/2017 to allow self management following discharge from physical therapy    Baseline limited knowlege of appropriate pain control strategies, exercises and progression without moderate assistance   Status New               Plan - 04/24/17 1946    Clinical Impression Statement Patient is progressing towards goals with improved ROM and decreasing pain s/p RTC repair x 5 weeks. She will benefit from continued physical therapy intervention to achieve goals.    Rehab Potential Good   Clinical Impairments Affecting Rehab Potential (+)acute condition, motivated, prior level of function (-) chronic condition prior to surgery   PT Frequency 2x / week   PT Duration 8 weeks   PT Treatment/Interventions Patient/family education;Electrical Stimulation;Cryotherapy;Moist Heat;Neuromuscular re-education;Therapeutic exercise;Manual techniques   PT Next Visit Plan therapeutic exercise, pain control; electrical stimulation   PT Home Exercise Plan scapular retraction, active motion right elbow, forearm wrist, hand      Patient will benefit from skilled therapeutic intervention in order to improve the following deficits and impairments:  Decreased strength, Pain, Impaired perceived functional ability, Decreased activity tolerance, Decreased endurance, Increased muscle spasms, Impaired UE functional use, Decreased range  of motion  Visit Diagnosis: Acute pain of right shoulder  Muscle weakness (generalized)  Other muscle spasm     Problem List Patient Active Problem List   Diagnosis Date Noted  . Pre-operative examination 02/21/2017  . Low back pain 06/11/2016  . Osteopenia 09/30/2013  . Encounter for routine gynecological examination 01/18/2012  . Routine general medical examination at a health care facility 01/09/2012  . [place], [place] 02/06/2010  . DEGENERATIVE JOINT DISEASE, MILD 07/19/2009    Jomarie Longs PT 04/25/2017, 9:24 PM  [place] [facility] [address] [place], [address] Phone: [phone]   Fax:  [phone]  Name: Bridget [name] MRN: [id number] Date of Birth: 1956/05/13

## 2017-04-29 ENCOUNTER — Encounter: Payer: Self-pay | Admitting: Physical Therapy

## 2017-04-29 ENCOUNTER — Ambulatory Visit: Payer: 59 | Admitting: Physical Therapy

## 2017-04-29 DIAGNOSIS — M6281 Muscle weakness (generalized): Secondary | ICD-10-CM | POA: Diagnosis not present

## 2017-04-29 DIAGNOSIS — M62838 Other muscle spasm: Secondary | ICD-10-CM

## 2017-04-29 DIAGNOSIS — M25511 Pain in right shoulder: Secondary | ICD-10-CM

## 2017-04-30 NOTE — Therapy (Signed)
[place] [address], [place], [address] Phone: [phone]   Fax:  [phone]  Physical Therapy Treatment  Patient Details  Name: Bridget [name] MRN: [id number] Date of Birth: [birth year] Referring Provider: Thornton Park MD  Encounter Date: 04/29/2017      PT End of Session - 04/29/17 1930    Visit Number 10   Number of Visits 16   Date for PT Re-Evaluation 05/23/17   PT Start Time 1850   PT Stop Time 1930   PT Time Calculation (min) 40 min   Activity Tolerance Patient tolerated treatment well;Patient limited by pain   Behavior During Therapy [facility] for tasks assessed/performed      Past Medical History:  Diagnosis Date  . 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 Chronic back pain     Past Surgical History:  Procedure Laterality Date  . ANKLE SURGERY Right   . CESAREAN SECTION  1984 and 1986   hodp pre term labor CS  . OVARIAN CYST SURGERY  1973  . SHOULDER ARTHROSCOPY WITH OPEN ROTATOR CUFF REPAIR AND DISTAL CLAVICLE ACROMINECTOMY Right 03/19/2017   Procedure: SHOULDER ARTHROSCOPY WITH OPEN ROTATOR CUFF REPAIR AND SUBACROMINAL DECOMPRESSION;  Surgeon: Thornton Park, MD;  Location: [facility] ORS;  Service: Orthopedics;  Laterality: Right;  . TUBAL LIGATION  1999  . WRIST GANGLION EXCISION      There were no vitals filed for this visit.      Subjective Assessment - 04/29/17 1852    Subjective Patient reports she is having increased soreness in right shoulder, achiness, not throbbing. improved with personal hygiene, toileting, and pulling up pants. 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   Pertinent History May 2017 lifted a case of water and felt a pain in right shoulder. She had PT prior to surgery without lasting results and then had MRI and underwent RTC repair 03/19/2017. She also has back pain.     Limitations Lifting;House hold activities;Other (comment)  Work   Patient Stated Goals return to full functional use right UE to return to prior level of function   Currently in Pain? Yes   Pain Score 3    Pain Location Shoulder   Pain Orientation Right   Pain Descriptors / Indicators Aching   Pain Type Surgical pain  03/19/2017   Pain Onset More than a month ago   Pain Frequency Intermittent          Objective:    AAROM: right shoulder supine 0- 95 with pain limiting further motion    Palpation: + spasms right upper trapezius, increased tenderness along right biceps and posterior aspect of right shoulder     Treatment: Manual therapy:  With patient seated with right UE supported; therapist performed superficial soft tissue mobilization to right upper and mid trapezius, cervical spine musculature, scalenes and paraspinal muscles x 20min. Goal: decrease spasms, pain, improve soft tissue elasticity, decrease hiking of right shoulder  Therapeutic exercise: patient performed with assistance, demonstration and VC of therapist: Supine and sitting: AAROM right UE forward elevation, ER/IR x 10 with UE supported by therapist in plane of scapula, multiple repetitions and sets as tolerated, required VC to relax during pain, muscle spasm Standing UE ranger (on floor) for forward flexion and rotations 2 x 10 reps Standing UE on wall attempted sets of forward elevation  with different heights with patient unable to tolerate exercises without increased shoulder pain therefore discontinued  Modalities: Electrical stimulation: x 20 min.: Russian stim. Applied to medial border of right scapula and lower trapezius for muscle re education, High volt estim.clincial program for muscle spasms(2) electrodes applied to right shoulder anterior aspect right shoulder/biceps,intensity to tolerance with patient seated and right UE supported on pillow with ice pack applied to same; goal: pain, spasms; no  adverse reactions noted  Patient response to treatment: Patient continued with pain as limiting factor to being able tolerate exercises today for forward elevation exercises. She demonstrated improved soft tissue elasticity and decreased spasms and pain by 50% following STM.  Patient patient reported decreased pain at end of session to mild in upper arm and trapezius muscles.          PT Education - 04/29/17 1915    Education provided Yes   Education Details HEP; self AAROM supine lying, AAROM with shoulder is proper alignment in sittiing/standing with UE ranger   Person(s) Educated Patient   Methods Explanation;Demonstration;Verbal cues   Comprehension Verbalized understanding;Returned demonstration;Verbal cues required             PT Long Term Goals - 03/28/17 1200      PT LONG TERM GOAL #1   Title Patient will demonstrate improved functional use right UE with decreased pain to mild with QuickDash score of 40% or less by 05/23/2017   Baseline QuickDash 80%   Status New     PT LONG TERM GOAL #2   Title Patient will be independent with home program for pain control, flexibiltiy and strength by 05/23/2017 to allow self management following discharge from physical therapy    Baseline limited knowlege of appropriate pain control strategies, exercises and progression without moderate assistance   Status New               Plan - 04/29/17 1940    Clinical Impression Statement Patient is limited in [place] and exercise progression due to continued increased pain today 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 She responded to therapy intervention with reduced pain level however continues with difficulty with ROM exercises. This should improve as she continues to monitor pain and perform pain control strategies including ice and medication as prescribed.    Rehab Potential Good   Clinical Impairments Affecting Rehab Potential (+)acute condition,  motivated, prior level of function (-) chronic condition prior to surgery   PT Frequency 2x / week   PT Duration 8 weeks   PT Treatment/Interventions Patient/family education;Electrical Stimulation;Cryotherapy;Moist Heat;Neuromuscular re-education;Therapeutic exercise;Manual techniques   PT Next Visit Plan therapeutic exercise, pain control; electrical stimulation   PT Home Exercise Plan scapular retraction, active motion right elbow, forearm wrist, hand      Patient will benefit from skilled therapeutic intervention in order to improve the following deficits and impairments:  Decreased strength, Pain, Impaired perceived functional ability, Decreased activity tolerance, Decreased endurance, Increased muscle spasms, Impaired UE functional use, Decreased range of motion  Visit Diagnosis: Acute pain of right shoulder  Muscle weakness (generalized)  Other muscle spasm     Problem List Patient Active Problem List   Diagnosis Date Noted  . Pre-operative examination 02/21/2017  . Low back pain 06/11/2016  . Osteopenia 09/30/2013  . Encounter for routine gynecological examination 01/18/2012  . Routine general medical examination at a health care facility 01/09/2012  . [place], [place] 02/06/2010  . DEGENERATIVE JOINT DISEASE, MILD 07/19/2009  Jomarie Longs PT 04/30/2017, 8:28 PM  [place] [facility] [address], [place], [address] Phone: [phone]   Fax:  [phone]  Name: CHELSEE HOSIE MRN: [id number] Date of Birth: [birth year]

## 2017-05-07 ENCOUNTER — Ambulatory Visit: Payer: 59 | Admitting: Physical Therapy

## 2017-05-07 ENCOUNTER — Encounter: Payer: Self-pay | Admitting: Physical Therapy

## 2017-05-07 DIAGNOSIS — M25511 Pain in right shoulder: Secondary | ICD-10-CM | POA: Diagnosis not present

## 2017-05-07 DIAGNOSIS — M6281 Muscle weakness (generalized): Secondary | ICD-10-CM | POA: Diagnosis not present

## 2017-05-07 DIAGNOSIS — M62838 Other muscle spasm: Secondary | ICD-10-CM

## 2017-05-07 NOTE — Therapy (Signed)
[facility] [address] [place], [address] Phone: [phone]   Fax:  [phone]  Physical Therapy Treatment  Patient Details  Name: Bridget [name] MRN: [id number] Date of Birth: [birth year] Referring Provider: Thornton Park MD  Encounter Date: 05/07/2017      PT End of Session - 05/07/17 1705    Visit Number 11   Number of Visits 16   Date for PT Re-Evaluation 05/23/17   PT Start Time 1700   PT Stop Time 1745   PT Time Calculation (min) 45 min   Activity Tolerance Patient tolerated treatment well;Patient limited by pain   Behavior During Therapy [facility] for tasks assessed/performed      Past Medical History:  Diagnosis Date  . 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 Chronic back pain     Past Surgical History:  Procedure Laterality Date  . ANKLE SURGERY Right   . CESAREAN SECTION  1984 and 1986   hodp pre term labor CS  . OVARIAN CYST SURGERY  1973  . SHOULDER ARTHROSCOPY WITH OPEN ROTATOR CUFF REPAIR AND DISTAL CLAVICLE ACROMINECTOMY Right 03/19/2017   Procedure: SHOULDER ARTHROSCOPY WITH OPEN ROTATOR CUFF REPAIR AND SUBACROMINAL DECOMPRESSION;  Surgeon: Thornton Park, MD;  Location: [facility] ORS;  Service: Orthopedics;  Laterality: Right;  . TUBAL LIGATION  1999  . WRIST GANGLION EXCISION      There were no vitals filed for this visit.      Subjective Assessment - 05/07/17 1702    Subjective Patient reports she is still having soreness in right shoulder intermittenlty. She is sleeping better in alternate bed.    Pertinent History May 2017 lifted a case of water and felt a pain in right shoulder. She had PT prior to surgery without lasting results and then had MRI and underwent RTC repair 03/19/2017. She also has back pain.    Limitations Lifting;House hold activities;Other (comment)  Work   Patient Stated Goals return to full functional use right UE to return to prior level of function   Currently in Pain? Yes   Pain Score 3    Pain Location Shoulder   Pain Orientation Right   Pain Descriptors / Indicators Aching   Pain Type Surgical pain  03/19/2017   Pain Onset More than a month ago   Pain Frequency Intermittent          Objective:    AAROM: right shoulder supine 0- 130 with pain limiting further motion    Palpation: + spasms right upper trapezius, increased tenderness along right biceps and posterior aspect of right shoulder     Treatment: Manual therapy:  With patient seated and supine with right UE supported;therapist performed superficial soft tissue mobilization to right upper and mid trapezius, cervical spine musculature, scalenes and paraspinal muscles x 9min. Goal: decrease spasms, pain, improve soft tissue elasticity, decrease hiking of right shoulder  Therapeutic exercise: patient performed with assistance, demonstration and VC of therapist: Supine: AAROM right UE forward elevation, ER/IR x 10 with UE supported by therapist in plane of scapula, multiple repetitions and sets as tolerated, required VC to relax during pain, muscle spasm Stabilization with right UE at 90 degrees flexion hold 3 x 10 seconds (instructed patient to perform at home) ER/IR at neutral position hold 3 reps 10 seconds  Modalities: Electrical stimulation: x 20 min.: Russian stim. Applied to medial border of right scapula and lower trapezius for muscle re education (34volts), High volt  estim.clincial program for muscle spasms(2) electrodes applied to right shoulder anterior aspect right shoulder upper trapezius/biceps,intensity to tolerance with patient seated and right UE supported on pillow with ice pack applied to same; goal: pain, spasms; no adverse reactions noted  Patient response to treatment: Patient continued with pain as limiting factor to being able tolerate exercises today for forward elevation exercises. She demonstrated improved soft tissue elasticity and decreased  spasms and pain by 50% following STM. patient reported decreased pain at end of session to mild in upper arm and trapezius muscles. Patient demonstrated improved AAROM to 130 degrees with repetition, ER to 45 degrees.         PT Long Term Goals - 03/28/17 1200      PT LONG TERM GOAL #1   Title Patient will demonstrate improved functional use right UE with decreased pain to mild with QuickDash score of 40% or less by 05/23/2017   Baseline QuickDash 80%   Status New     PT LONG TERM GOAL #2   Title Patient will be independent with home program for pain control, flexibiltiy and strength by 05/23/2017 to allow self management following discharge from physical therapy    Baseline limited knowlege of appropriate pain control strategies, exercises and progression without moderate assistance   Status New               Plan - 05/07/17 1806    Clinical Impression Statement Patient demonstrated improved AAROM to 130 degrees elevation with patient supine lying. She continues with pain/spasms as her primary limiting factors. She should continue to improve with additional physical therapy intervention.    Rehab Potential Good   Clinical Impairments Affecting Rehab Potential (+)acute condition, motivated, prior level of function (-) chronic condition prior to surgery   PT Frequency 2x / week   PT Duration 8 weeks   PT Treatment/Interventions Patient/family education;Electrical Stimulation;Cryotherapy;Moist Heat;Neuromuscular re-education;Therapeutic exercise;Manual techniques   PT Next Visit Plan therapeutic exercise, pain control; electrical stimulation   PT Home Exercise Plan scapular retraction, active motion right elbow, forearm wrist, hand      Patient will benefit from skilled therapeutic intervention in order to improve the following deficits and impairments:  Decreased strength, Pain, Impaired perceived functional ability, Decreased activity tolerance, Decreased endurance, Increased  muscle spasms, Impaired UE functional use, Decreased range of motion  Visit Diagnosis: Acute pain of right shoulder  Muscle weakness (generalized)  Other muscle spasm     Problem List Patient Active Problem List   Diagnosis Date Noted  . Pre-operative examination 02/21/2017  . Low back pain 06/11/2016  . Osteopenia 09/30/2013  . Encounter for routine gynecological examination 01/18/2012  . Routine general medical examination at a health care facility 01/09/2012  . [place], [place] 02/06/2010  . DEGENERATIVE JOINT DISEASE, MILD 07/19/2009    Jomarie Longs PT 05/08/2017, 6:04 PM  [facility] [address], [place], [address] Phone: [phone]   Fax:  [phone]  Name: SHADA NIENABER MRN: [id number] Date of Birth: [birth year]

## 2017-05-09 ENCOUNTER — Encounter: Payer: Self-pay | Admitting: Physical Therapy

## 2017-05-09 ENCOUNTER — Ambulatory Visit: Payer: 59 | Admitting: Physical Therapy

## 2017-05-09 DIAGNOSIS — M6281 Muscle weakness (generalized): Secondary | ICD-10-CM

## 2017-05-09 DIAGNOSIS — M62838 Other muscle spasm: Secondary | ICD-10-CM | POA: Diagnosis not present

## 2017-05-09 DIAGNOSIS — M25511 Pain in right shoulder: Secondary | ICD-10-CM | POA: Diagnosis not present

## 2017-05-10 NOTE — Therapy (Signed)
[facility] [address], [place], [address] Phone: [phone]   Fax:  [phone]  Physical Therapy Treatment  Patient Details  Name: Bridget [name] MRN: [id number] Date of Birth: 12-Jul-1956 Referring Provider: Thornton Park MD  Encounter Date: 05/09/2017      PT End of Session - 05/09/17 1800    Visit Number 12   Number of Visits 16   Date for PT Re-Evaluation 05/23/17   PT Start Time 1702   PT Stop Time 1800   PT Time Calculation (min) 58 min   Activity Tolerance Patient tolerated treatment well;Patient limited by pain   Behavior During Therapy [facility] for tasks assessed/performed      Past Medical History:  Diagnosis Date  . 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 Chronic back pain     Past Surgical History:  Procedure Laterality Date  . ANKLE SURGERY Right   . CESAREAN SECTION  1984 and 1986   hodp pre term labor CS  . OVARIAN CYST SURGERY  1973  . SHOULDER ARTHROSCOPY WITH OPEN ROTATOR CUFF REPAIR AND DISTAL CLAVICLE ACROMINECTOMY Right 03/19/2017   Procedure: SHOULDER ARTHROSCOPY WITH OPEN ROTATOR CUFF REPAIR AND SUBACROMINAL DECOMPRESSION;  Surgeon: Thornton Park, MD;  Location: [facility] ORS;  Service: Orthopedics;  Laterality: Right;  . TUBAL LIGATION  1999  . WRIST GANGLION EXCISION      There were no vitals filed for this visit.      Subjective Assessment - 05/09/17 1706    Subjective Patient reports soreness in right upper trapezius today. Overall she is still having intermittent soreness and pain and limited motion right shoulder. She is exercising as instructed and would like to have exercises re assessed so she is doing all she is supposed to.    Pertinent History May 2017 lifted a case of water and felt a pain in right shoulder. She had PT prior to surgery without lasting results and then had MRI and underwent RTC repair 03/19/2017. She also has back pain.    Limitations  Lifting;House hold activities;Other (comment)  Work   Patient Stated Goals return to full functional use right UE to return to prior level of function   Currently in Pain? Yes   Pain Score 4    Pain Location Shoulder   Pain Orientation Right   Pain Descriptors / Indicators Aching;Sore   Pain Type Surgical pain  03/19/2017   Pain Onset More than a month ago         Objective: AAROM: right shoulder supine 0- 110 with pain limiting further motion Palpation: + spasms right upper trapezius, increased tenderness along right biceps and posterior aspect of right shoulder  Treatment: Manual therapy:  With patient seated and supinewith right UE supported;therapist performed superficial soft tissue mobilization to right upper and midtrapezius, cervical spine musculature, scalenes and paraspinal muscles x 67min. Goal: decrease spasms, pain, improve soft tissue elasticity, decrease hiking of right shoulder  Therapeutic exercise: patient performed with assistance, demonstration and VC of therapist: Supine: AAROM right UE forward elevation, ER/IR x 10 with UE supported by therapist in plane of scapula, multiple repetitions and sets as tolerated, required VC to relax during pain, muscle spasm Stabilization with right UE at 90 degrees flexion hold 3 x 10 seconds  ER/IR at neutral position hold 3 reps 10 seconds Side lying left side and performing exercises with right UE: ER with towel between arm and trunk with assist of therapist  x 10 reps Scapular control exercises with assist and tactile facilitation/cuing of therapist: elevation/depression x 10 reps  Modalities: Electrical stimulation: x 20 min.: Russian stim. Applied to medial border of right scapula and lower trapezius for muscle re education (34volts), High volt estim.clincial program for muscle spasms(2) electrodes applied to right shoulder anterior aspect right shoulder upper trapezius/biceps,intensity to tolerance with  patient seated and right UE supported on pillow with ice pack applied to same; goal: pain, spasms; no adverse reactions noted  Patient response to treatment: Patient demonstrated improved technique with exercises with moderate assistance and VC for encouraging relaxation and correct alignment. Patient with decreased pain from 4/10 to 2/10. Patient with decreased spasms and improved soft tissue elasticity by 30% following STM. Patient was limited with exercises due to pain and spasms.decreased tenderness right upper trapezius and shoulder following modalities.          PT Education - 05/09/17 1755    Education provided Yes   Education Details HEP re assessed with handout given   Person(s) Educated Patient   Methods Explanation;Demonstration;Tactile cues;Verbal cues;Handout   Comprehension Verbalized understanding;Returned demonstration;Verbal cues required             PT Long Term Goals - 03/28/17 1200      PT LONG TERM GOAL #1   Title Patient will demonstrate improved functional use right UE with decreased pain to mild with QuickDash score of 40% or less by 05/23/2017   Baseline QuickDash 80%   Status New     PT LONG TERM GOAL #2   Title Patient will be independent with home program for pain control, flexibiltiy and strength by 05/23/2017 to allow self management following discharge from physical therapy    Baseline limited knowlege of appropriate pain control strategies, exercises and progression without moderate assistance   Status New               Plan - 05/09/17 1810    Clinical Impression Statement Patient is progressing steadily with increasing right shoulder AAROM and able to tolerate exercises with less difficulty/pain. she continues with pain, decreased ROM and strength and will continue to require additional physical therapy intervention to achieve goals.    Rehab Potential Good   Clinical Impairments Affecting Rehab Potential (+)acute condition, motivated,  prior level of function (-) chronic condition prior to surgery   PT Frequency 2x / week   PT Duration 8 weeks   PT Treatment/Interventions Patient/family education;Electrical Stimulation;Cryotherapy;Moist Heat;Neuromuscular re-education;Therapeutic exercise;Manual techniques   PT Next Visit Plan therapeutic exercise, pain control; electrical stimulation   PT Home Exercise Plan scapular retraction, active motion right elbow, forearm wrist, hand      Patient will benefit from skilled therapeutic intervention in order to improve the following deficits and impairments:  Decreased strength, Pain, Impaired perceived functional ability, Decreased activity tolerance, Decreased endurance, Increased muscle spasms, Impaired UE functional use, Decreased range of motion  Visit Diagnosis: Acute pain of right shoulder  Muscle weakness (generalized)  Other muscle spasm     Problem List Patient Active Problem List   Diagnosis Date Noted  . Pre-operative examination 02/21/2017  . Low back pain 06/11/2016  . Osteopenia 09/30/2013  . Encounter for routine gynecological examination 01/18/2012  . Routine general medical examination at a health care facility 01/09/2012  . [place], [place] 02/06/2010  . DEGENERATIVE JOINT DISEASE, MILD 07/19/2009    Jomarie Longs PT 05/10/2017, 7:19 PM  [facility] [address]  [place], [place], [address] Phone: [phone]   Fax:  [phone]  Name: LEYLANI DULEY MRN: [id number] Date of Birth: 06/10/1956

## 2017-05-13 ENCOUNTER — Encounter: Payer: Self-pay | Admitting: Physical Therapy

## 2017-05-13 ENCOUNTER — Ambulatory Visit: Payer: 59 | Admitting: Physical Therapy

## 2017-05-13 DIAGNOSIS — M62838 Other muscle spasm: Secondary | ICD-10-CM | POA: Diagnosis not present

## 2017-05-13 DIAGNOSIS — M25511 Pain in right shoulder: Secondary | ICD-10-CM

## 2017-05-13 DIAGNOSIS — M6281 Muscle weakness (generalized): Secondary | ICD-10-CM | POA: Diagnosis not present

## 2017-05-13 NOTE — Therapy (Signed)
[facility] [address], [place], [address] Phone: [phone]   Fax:  [phone]  Physical Therapy Treatment  Patient Details  Name: Bridget [name] MRN: [id number] Date of Birth: 13-Jan-1956 Referring Provider: Thornton Park MD  Encounter Date: 05/13/2017      PT End of Session - 05/13/17 1753    Visit Number 13   Number of Visits 16   Date for PT Re-Evaluation 05/23/17   PT Start Time 6270   PT Stop Time 1800   PT Time Calculation (min) 55 min   Activity Tolerance Patient tolerated treatment well;Patient limited by pain   Behavior During Therapy [facility] for tasks assessed/performed      Past Medical History:  Diagnosis Date  . 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 Chronic back pain     Past Surgical History:  Procedure Laterality Date  . ANKLE SURGERY Right   . CESAREAN SECTION  1984 and 1986   hodp pre term labor CS  . OVARIAN CYST SURGERY  1973  . SHOULDER ARTHROSCOPY WITH OPEN ROTATOR CUFF REPAIR AND DISTAL CLAVICLE ACROMINECTOMY Right 03/19/2017   Procedure: SHOULDER ARTHROSCOPY WITH OPEN ROTATOR CUFF REPAIR AND SUBACROMINAL DECOMPRESSION;  Surgeon: Thornton Park, MD;  Location: [facility] ORS;  Service: Orthopedics;  Laterality: Right;  . TUBAL LIGATION  1999  . WRIST GANGLION EXCISION      There were no vitals filed for this visit.      Subjective Assessment - 05/13/17 1706    Subjective Patient reports she is able to raise arm some and is noticing improving pain with ibuprofen. She continues with pain as her primary limitation to using right UE and overall it is improving slowly.   Pertinent History May 2017 lifted a case of water and felt a pain in right shoulder. She had PT prior to surgery without lasting results and then had MRI and underwent RTC repair 03/19/2017. She also has back pain.    Limitations Lifting;House hold activities;Other (comment)  Work   Patient Stated Goals  return to full functional use right UE to return to prior level of function   Currently in Pain? Yes   Pain Score 2    Pain Location Shoulder   Pain Orientation Right   Pain Descriptors / Indicators Aching;Sore   Pain Type Surgical pain  03/19/2017   Pain Onset More than a month ago   Pain Frequency Intermittent        Objective: AROM: right shoulder sitting forward elevation 0- 105with pain limiting further motion and assistance required for lowering due to pain    AAROM; right shoulder supine forward elevation 0-115 with stiffness/pain/spasms limiting further motion; ER 0-50 Palpation: + spasms right upper trapezius, increased tenderness along right biceps and posterior aspect of right shoulder; decreased from previous session  Treatment: Manual therapy: 46min. With patient supinewith right UE supported;therapist performed superficial soft tissue mobilization to right uppertrapezius, cervical spine musculature, scalenes and paraspinal muscles and biceps muscle; joint mobilization GHJ right shoulder lateral distraction grade 1-2 for pain relief x 10 reps Goal: decrease spasms, pain, improve soft tissue elasticity, decrease hiking of right shoulder  Therapeutic exercise: patient performed with assistance, demonstration and VC of therapist: Supine: AAROM right UE forward elevation, ER/IR x 10 with UE supported by therapist in plane of scapula, multiple repetitions and sets as tolerated, required VC to relax during pain, muscle spasm Stabilization with right UE at 90 degrees flexion hold  2 x 10 seconds with patient performing with protraction of shoulder to decreased shoulder pain ER/IR at neutral position hold 3 reps 10 seconds Side lying left side and performing exercises with right UE: ER with towel between arm and trunk with guidance for correct alignment of shoulder x 15 reps Scapular control exercises with assist and tactile facilitation/cuing of therapist:  elevation/depression x 10 reps; good controlled motion  Modalities: Electrical stimulation: x 20 min.: Russian stim. Applied to medial border of right scapula and lower trapezius for muscle re education (32 ma), High volt estim.clincial program for muscle spasms(2) electrodes applied to right shoulder posterior aspect right shoulder upper trapezius,intensity to tolerance with patient seated and right UE supported on pillow with ice pack applied to same; goal: pain, spasms; no adverse reactions noted  Patient response to treatment: Patient demonstrated improved technique with exercises with minimal VC for correct alignment; limited by pain for further motion. Patient able to perform shoulder elevation through further ROM in sitting following STM and exercise (up to 115 degrees). Patient with decreased spasms by >50% following STM. Improved motor control with repetition and cuing for periscapular muscles. Patient reported decreased soreness to mild at end of session following modalities.            PT Education - 05/13/17 1753    Education provided Yes   Education Details Healing of right shoulder s/p surgery, time of healing for soft tissue, pain control, progression of exercises   Person(s) Educated Patient   Methods Explanation   Comprehension Verbalized understanding             PT Long Term Goals - 03/28/17 1200      PT LONG TERM GOAL #1   Title Patient will demonstrate improved functional use right UE with decreased pain to mild with QuickDash score of 40% or less by 05/23/2017   Baseline QuickDash 80%   Status New     PT LONG TERM GOAL #2   Title Patient will be independent with home program for pain control, flexibiltiy and strength by 05/23/2017 to allow self management following discharge from physical therapy    Baseline limited knowlege of appropriate pain control strategies, exercises and progression without moderate assistance   Status New                Plan - 05/13/17 1754    Clinical Impression Statement Patient is progressing slowly towards goals and demonstrates decreased pain and improving AAROM/AROM this week. She should continue to improve with additional physical therapy intervention.    Rehab Potential Good   Clinical Impairments Affecting Rehab Potential (+)acute condition, motivated, prior level of function (-) chronic condition prior to surgery   PT Frequency 2x / week   PT Duration 8 weeks   PT Treatment/Interventions Patient/family education;Electrical Stimulation;Cryotherapy;Moist Heat;Neuromuscular re-education;Therapeutic exercise;Manual techniques   PT Next Visit Plan therapeutic exercise, pain control; electrical stimulation   PT Home Exercise Plan scapular retraction, active motion right elbow, forearm wrist, hand      Patient will benefit from skilled therapeutic intervention in order to improve the following deficits and impairments:  Decreased strength, Pain, Impaired perceived functional ability, Decreased activity tolerance, Decreased endurance, Increased muscle spasms, Impaired UE functional use, Decreased range of motion  Visit Diagnosis: Acute pain of right shoulder  Muscle weakness (generalized)  Other muscle spasm     Problem List Patient Active Problem List   Diagnosis Date Noted  . Pre-operative examination 02/21/2017  . Low back pain 06/11/2016  .  Osteopenia 09/30/2013  . Encounter for routine gynecological examination 01/18/2012  . Routine general medical examination at a health care facility 01/09/2012  . [place], [place] 02/06/2010  . DEGENERATIVE JOINT DISEASE, MILD 07/19/2009    Jomarie Longs PT 05/13/2017, 6:22 PM  [facility] [address] [place], [address] Phone: [phone]   Fax:  [phone]  Name: REHMAT MURTAGH MRN: [id number] Date of Birth: [birth year]

## 2017-05-14 ENCOUNTER — Encounter: Payer: 59 | Admitting: Physical Therapy

## 2017-05-15 ENCOUNTER — Ambulatory Visit: Payer: 59 | Admitting: Physical Therapy

## 2017-05-15 ENCOUNTER — Encounter: Payer: Self-pay | Admitting: Physical Therapy

## 2017-05-15 DIAGNOSIS — M6281 Muscle weakness (generalized): Secondary | ICD-10-CM

## 2017-05-15 DIAGNOSIS — M25511 Pain in right shoulder: Secondary | ICD-10-CM | POA: Diagnosis not present

## 2017-05-15 DIAGNOSIS — M75111 Incomplete rotator cuff tear or rupture of right shoulder, not specified as traumatic: Secondary | ICD-10-CM | POA: Diagnosis not present

## 2017-05-15 DIAGNOSIS — M62838 Other muscle spasm: Secondary | ICD-10-CM | POA: Diagnosis not present

## 2017-05-16 ENCOUNTER — Encounter: Payer: 59 | Admitting: Physical Therapy

## 2017-05-16 NOTE — Therapy (Signed)
[facility] [address], [place], [address] Phone: [phone]   Fax:  [phone]  Physical Therapy Treatment  Patient Details  Name: Bridget [name] MRN: [id number] Date of Birth: [birth year] Referring Provider: Thornton Park MD  Encounter Date: 05/15/2017      PT End of Session - 05/15/17 1820    Visit Number 14   Number of Visits 16   Date for PT Re-Evaluation 05/23/17   PT Start Time 1720   PT Stop Time 1812   PT Time Calculation (min) 52 min   Activity Tolerance Patient tolerated treatment well;Patient limited by pain   Behavior During Therapy [facility] for tasks assessed/performed      Past Medical History:  Diagnosis Date  . 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 Chronic back pain     Past Surgical History:  Procedure Laterality Date  . ANKLE SURGERY Right   . CESAREAN SECTION  1984 and 1986   hodp pre term labor CS  . OVARIAN CYST SURGERY  1973  . SHOULDER ARTHROSCOPY WITH OPEN ROTATOR CUFF REPAIR AND DISTAL CLAVICLE ACROMINECTOMY Right 03/19/2017   Procedure: SHOULDER ARTHROSCOPY WITH OPEN ROTATOR CUFF REPAIR AND SUBACROMINAL DECOMPRESSION;  Surgeon: Thornton Park, MD;  Location: [facility] ORS;  Service: Orthopedics;  Laterality: Right;  . TUBAL LIGATION  1999  . WRIST GANGLION EXCISION      There were no vitals filed for this visit.      Subjective Assessment - 05/15/17 1729    Subjective Patient reports she was seen by MD today and is progressing as anticipated. She continues with pain in right shoulder that is improving at this time. She is still having difficulty with personal care activities using right UE.    Pertinent History May 2017 lifted a case of water and felt a pain in right shoulder. She had PT prior to surgery without lasting results and then had MRI and underwent RTC repair 03/19/2017. She also has back pain.    Limitations Lifting;House hold activities;Other (comment)   Work   Patient Stated Goals return to full functional use right UE to return to prior level of function   Currently in Pain? Yes   Pain Score 2    Pain Location Shoulder   Pain Orientation Right   Pain Descriptors / Indicators Aching   Pain Type Surgical pain  03/19/2017   Pain Onset More than a month ago   Pain Frequency Intermittent        Objective: AROM: right shoulder sitting forward elevation 0- 110with pain limiting further motion and assistance required for lowering due to pain Palpation: + spasms right upper trapezius, increased tenderness along right biceps and posterior aspect of right shoulder; decreased from previous session  Treatment: Manual therapy: 21min. With patient supinewith right UE supported;therapist performed superficial soft tissue mobilization to right uppertrapezius, cervical spine musculature, scalenes and paraspinal muscles and biceps muscle; joint mobilization GHJ right shoulder lateral distraction grade 1-2 for pain relief x 10 reps Goal: decrease spasms, pain, improve soft tissue elasticity, decrease hiking of right shoulder  Therapeutic exercise: patient performed with assistance, demonstration and VC of therapist: Supine: AAROM right UE forward elevation, ER/IR x 10 with UE supported by therapist in plane of scapula, multiple repetitions and sets as tolerated, required VC to relax during pain, muscle spasm Side lying left side and performing exercises with right UE: ER with towel between arm and trunk with guidance for correct  alignment of shoulder x 15 reps Scapular control exercises with assist and tactile facilitation/cuing of therapist: elevation/depression x 10 reps; good controlled motion  Modalities: Electrical stimulation: x 20 min.: Russian stim. Applied to medial border of right scapula and lower trapezius for muscle re education (32 ma), High volt estim.clincial program for muscle spasms(2) electrodes applied to right  shoulder posterior aspect right shoulder upper trapezius,intensity to tolerance with patient seated and right UE supported on pillow with moist heat pack applied to same; goal: pain, spasms; no adverse reactions noted  Patient response to treatment: patient demonstrated improved technique with exercises with minimal VC for correct alignment. Patient with mild soreness during exercises. Patient with decreased spasms by 50% following STM. Improved motor control with repetition and cuing. No soreness reported following modalities.         PT Education - 05/15/17 1800    Education provided Yes   Education Details home execises ROM, strengthening, scapular control exercises to encourage stabilization   Person(s) Educated Patient   Methods Explanation;Demonstration;Verbal cues   Comprehension Verbalized understanding;Returned demonstration;Verbal cues required             PT Long Term Goals - 03/28/17 1200      PT LONG TERM GOAL #1   Title Patient will demonstrate improved functional use right UE with decreased pain to mild with QuickDash score of 40% or less by 05/23/2017   Baseline QuickDash 80%   Status New     PT LONG TERM GOAL #2   Title Patient will be independent with home program for pain control, flexibiltiy and strength by 05/23/2017 to allow self management following discharge from physical therapy    Baseline limited knowlege of appropriate pain control strategies, exercises and progression without moderate assistance   Status New               Plan - 05/15/17 1826    Clinical Impression Statement Patient is progressing steadily with goals with improving AAROM and AROM right shoulder s/p surgery. She will benefit from continued physical therapy intervention to achieve goals.    Rehab Potential Good   Clinical Impairments Affecting Rehab Potential (+)acute condition, motivated, prior level of function (-) chronic condition prior to surgery   PT Frequency 2x /  week   PT Duration 8 weeks   PT Treatment/Interventions Patient/family education;Electrical Stimulation;Cryotherapy;Moist Heat;Neuromuscular re-education;Therapeutic exercise;Manual techniques   PT Next Visit Plan therapeutic exercise, pain control; electrical stimulation   PT Home Exercise Plan scapular retraction, AAROM right UE, shoulder, scapular control exercises      Patient will benefit from skilled therapeutic intervention in order to improve the following deficits and impairments:  Decreased strength, Pain, Impaired perceived functional ability, Decreased activity tolerance, Decreased endurance, Increased muscle spasms, Impaired UE functional use, Decreased range of motion  Visit Diagnosis: Acute pain of right shoulder  Muscle weakness (generalized)  Other muscle spasm     Problem List Patient Active Problem List   Diagnosis Date Noted  . Pre-operative examination 02/21/2017  . Low back pain 06/11/2016  . Osteopenia 09/30/2013  . Encounter for routine gynecological examination 01/18/2012  . Routine general medical examination at a health care facility 01/09/2012  . [place], [place] 02/06/2010  . DEGENERATIVE JOINT DISEASE, MILD 07/19/2009    Jomarie Longs PT 05/16/2017, 11:29 PM  [facility] [address], [place], [address] Phone: [phone]   Fax:  [phone]  Name: CALINDA STOCKINGER MRN: [id number] Date of  Birth: 1956/07/04

## 2017-05-21 ENCOUNTER — Ambulatory Visit: Payer: 59 | Admitting: Physical Therapy

## 2017-05-21 ENCOUNTER — Encounter: Payer: Self-pay | Admitting: Physical Therapy

## 2017-05-21 DIAGNOSIS — M25511 Pain in right shoulder: Secondary | ICD-10-CM | POA: Diagnosis not present

## 2017-05-21 DIAGNOSIS — M62838 Other muscle spasm: Secondary | ICD-10-CM | POA: Diagnosis not present

## 2017-05-21 DIAGNOSIS — M6281 Muscle weakness (generalized): Secondary | ICD-10-CM | POA: Diagnosis not present

## 2017-05-21 NOTE — Therapy (Signed)
[facility] [address] [place], [address] Phone: [phone]   Fax:  [phone]  Physical Therapy Treatment  Patient Details  Name: Bridget [name] MRN: [id number] Date of Birth: September 27, 1956 Referring Provider: Thornton Park MD  Encounter Date: 05/21/2017      PT End of Session - 05/21/17 0838    Visit Number 15   Number of Visits 16   Date for PT Re-Evaluation 05/23/17   PT Start Time 0801   PT Stop Time 0846   PT Time Calculation (min) 45 min   Activity Tolerance Patient tolerated treatment well;Patient limited by pain   Behavior During Therapy [facility] for tasks assessed/performed      Past Medical History:  Diagnosis Date  . 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 Chronic back pain     Past Surgical History:  Procedure Laterality Date  . ANKLE SURGERY Right   . CESAREAN SECTION  1984 and 1986   hodp pre term labor CS  . OVARIAN CYST SURGERY  1973  . SHOULDER ARTHROSCOPY WITH OPEN ROTATOR CUFF REPAIR AND DISTAL CLAVICLE ACROMINECTOMY Right 03/19/2017   Procedure: SHOULDER ARTHROSCOPY WITH OPEN ROTATOR CUFF REPAIR AND SUBACROMINAL DECOMPRESSION;  Surgeon: Thornton Park, MD;  Location: [facility] ORS;  Service: Orthopedics;  Laterality: Right;  . TUBAL LIGATION  1999  . WRIST GANGLION EXCISION      There were no vitals filed for this visit.      Subjective Assessment - 05/21/17 0802    Subjective Patient is doing well and using arm more for hair care and personal care activities.     Pertinent History May 2017 lifted a case of water and felt a pain in right shoulder. She had PT prior to surgery without lasting results and then had MRI and underwent RTC repair 03/19/2017. She also has back pain.    Limitations Lifting;House hold activities;Other (comment)  Work   Patient Stated Goals return to full functional use right UE to return to prior level of function   Currently in Pain? Yes   Pain Score  2    Pain Location Shoulder   Pain Orientation Right   Pain Descriptors / Indicators Aching   Pain Type Surgical pain;Acute pain  03/19/2017   Pain Onset More than a month ago   Pain Frequency Intermittent          Objective: AROM: right shoulder sitting forward elevation0- 110with weakness and pain limiting further motion Palpation: + spasms right upper trapezius, increased tenderness along right biceps and posterior aspect of right shoulder; decreased from previous session  Treatment: Manual therapy: 27min. With patient supinewith right UE supported;therapist performed superficial soft tissue mobilization to right uppertrapezius, cervical spine musculature, scalenes and paraspinal muscles and biceps muscle; joint mobilization GHJ right shoulder lateral distraction grade 1-2 for pain relief x 10 reps Goal: decrease spasms, pain, improve soft tissue elasticity, decrease hiking of right shoulder  Therapeutic exercise: patient performed with assistance, demonstration and VC of therapist: Supine: AAROM right UE forward elevation to 140 degrees, ER/IR x 10 with UE supported by therapist in plane of scapula, multiple repetitions and sets as tolerated, required VC to relax during pain, muscle spasm Standing: Isometric exercises at door frame for flexion, rotations instructed with demonstration and VC Modalities:  Electrical stimulation: x 20 min.: Russian stim. Applied to medial border of right scapula and lower trapezius for muscle re education (28 ma), High volt estim.clincial program for  muscle spasms(2) electrodes applied to right shoulder upper trapezius and anterior aspect upper arm, biceps,intensity to tolerance with patient seated and right UE supported on pillow with moist heat pack applied to same; goal: pain, spasms; no adverse reactions noted  Patient response to treatment: Patient demonstrated improved technique with isometric exercises at wall with minimal VC  and demonstration. Improved soft tissue elasticity and decreased spasms by at least 50% following STM. Decreased soreness to mild following heat and modalities at end of session.         PT Education - 05/21/17 0803    Education provided Yes   Education Details isometric exercises at wall   Person(s) Educated Patient   Methods Explanation;Demonstration;Verbal cues;Handout   Comprehension Verbalized understanding;Verbal cues required;Returned demonstration             PT Long Term Goals - 03/28/17 1200      PT LONG TERM GOAL #1   Title Patient will demonstrate improved functional use right UE with decreased pain to mild with QuickDash score of 40% or less by 05/23/2017   Baseline QuickDash 80%   Status New     PT LONG TERM GOAL #2   Title Patient will be independent with home program for pain control, flexibiltiy and strength by 05/23/2017 to allow self management following discharge from physical therapy    Baseline limited knowlege of appropriate pain control strategies, exercises and progression without moderate assistance   Status New               Plan - 05/21/17 0488    Clinical Impression Statement Patient is progressing steadily towards all goals with improving pain and ROM. She continues with limitations in ROM and strength which limit functional use for ADLs and household chores and community activities and will benefit from continued physical therpay interveniton to achieve goals.    Rehab Potential Fair   Clinical Impairments Affecting Rehab Potential (+)acute condition, motivated, prior level of function (-) chronic condition prior to surgery   PT Frequency 2x / week   PT Duration 8 weeks   PT Treatment/Interventions Patient/family education;Electrical Stimulation;Cryotherapy;Moist Heat;Neuromuscular re-education;Therapeutic exercise;Manual techniques   PT Next Visit Plan therapeutic exercise, pain control; electrical stimulation   PT Home Exercise Plan  scapular retraction, AAROM right UE, shoulder, scapular control exercises      Patient will benefit from skilled therapeutic intervention in order to improve the following deficits and impairments:  Decreased strength, Pain, Impaired perceived functional ability, Decreased activity tolerance, Decreased endurance, Increased muscle spasms, Impaired UE functional use, Decreased range of motion  Visit Diagnosis: Acute pain of right shoulder  Muscle weakness (generalized)  Other muscle spasm     Problem List Patient Active Problem List   Diagnosis Date Noted  . Pre-operative examination 02/21/2017  . Low back pain 06/11/2016  . Osteopenia 09/30/2013  . Encounter for routine gynecological examination 01/18/2012  . Routine general medical examination at a health care facility 01/09/2012  . [place], [place] 02/06/2010  . DEGENERATIVE JOINT DISEASE, MILD 07/19/2009    Jomarie Longs PT 05/21/2017, 6:49 PM  [facility] [address] [place], [address] Phone: [phone]   Fax:  [phone]  Name: KINJAL NEITZKE MRN: [id number] Date of Birth: 05-25-1956

## 2017-05-22 ENCOUNTER — Encounter: Payer: Self-pay | Admitting: Physical Therapy

## 2017-05-22 ENCOUNTER — Encounter: Payer: 59 | Admitting: Physical Therapy

## 2017-05-23 ENCOUNTER — Ambulatory Visit: Payer: 59 | Admitting: Physical Therapy

## 2017-05-28 ENCOUNTER — Ambulatory Visit: Payer: 59 | Attending: Orthopedic Surgery | Admitting: Physical Therapy

## 2017-05-28 ENCOUNTER — Encounter: Payer: Self-pay | Admitting: Physical Therapy

## 2017-05-28 DIAGNOSIS — M25511 Pain in right shoulder: Secondary | ICD-10-CM | POA: Insufficient documentation

## 2017-05-28 DIAGNOSIS — M6281 Muscle weakness (generalized): Secondary | ICD-10-CM | POA: Diagnosis not present

## 2017-05-28 DIAGNOSIS — M62838 Other muscle spasm: Secondary | ICD-10-CM | POA: Diagnosis not present

## 2017-05-29 ENCOUNTER — Encounter: Payer: 59 | Admitting: Physical Therapy

## 2017-05-29 NOTE — Therapy (Signed)
[facility] [address], [place], [address] Phone: [phone]   Fax:  [phone]  Physical Therapy Treatment  Patient Details  Name: Bridget [name] MRN: [id number] Date of Birth: 1956/04/16 Referring Provider: Thornton Park MD  Encounter Date: 05/28/2017      PT End of Session - 05/28/17 1800    Visit Number 16   Number of Visits 32   Date for PT Re-Evaluation 07/24/17   PT Start Time 6789   PT Stop Time 1800   PT Time Calculation (min) 55 min   Activity Tolerance Patient tolerated treatment well;Patient limited by pain   Behavior During Therapy [facility] for tasks assessed/performed      Past Medical History:  Diagnosis Date  . 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 Chronic back pain     Past Surgical History:  Procedure Laterality Date  . ANKLE SURGERY Right   . CESAREAN SECTION  1984 and 1986   hodp pre term labor CS  . OVARIAN CYST SURGERY  1973  . SHOULDER ARTHROSCOPY WITH OPEN ROTATOR CUFF REPAIR AND DISTAL CLAVICLE ACROMINECTOMY Right 03/19/2017   Procedure: SHOULDER ARTHROSCOPY WITH OPEN ROTATOR CUFF REPAIR AND SUBACROMINAL DECOMPRESSION;  Surgeon: Thornton Park, MD;  Location: [facility] ORS;  Service: Orthopedics;  Laterality: Right;  . TUBAL LIGATION  1999  . WRIST GANGLION EXCISION      There were no vitals filed for this visit.      Subjective Assessment - 05/28/17 1708    Subjective Patient reports she is feeling a little better slowly with pain and stiffness/aching as limitations in upper trapezius and upper arm.    Pertinent History May 2017 lifted a case of water and felt a pain in right shoulder. She had PT prior to surgery without lasting results and then had MRI and underwent RTC repair 03/19/2017. She also has back pain.    Limitations Lifting;House hold activities;Other (comment)  Work   Patient Stated Goals return to full functional use right UE to return to prior level of  function   Currently in Pain? Yes   Pain Score 2    Pain Location Shoulder   Pain Orientation Right   Pain Descriptors / Indicators Aching   Pain Type Surgical pain  03/20/2107   Pain Onset More than a month ago   Pain Frequency Constant         Objective: AROM: right shoulder sitting forward elevation0- 110with weakness and pain limiting further motion Palpation: + spasms right upper trapezius, increased tenderness along right biceps and posterior aspect of right shoulder; decreased from previous session  Treatment: Manual therapy: 52min. With patient supinewith right UE supported;therapist performed superficial soft tissue mobilization to right uppertrapezius, cervical spine musculature, scalenes and paraspinal muscles and biceps muscle; Goal: decrease spasms, pain, improve soft tissue elasticity, decrease hiking of right shoulder  Therapeutic exercise: patient performed with assistance, demonstration and VC of therapist: Supine: AAROM right UE forward elevation to 14 degrees, ER/IR x 10 with UE supported by therapist in plane of scapula, multiple repetitions and sets as tolerated, required VC to relax during pain, muscle spasm Side lying left: Performed ER with right UE with towel between trunk and arm 10 reps Seated:  OMEGA bilateral scapular retraction 5# x 15 reps; standing straight arm pull downs with 10# and demonstration/VC 15 reps Standing: At wall with back against wall to stabilize scapulae: ER x 10 reps Modalities:  Electrical stimulation: x  20 min.: [place] stim. Applied to medial border of right scapula and lower trapezius for muscle re education (~28 ma), High volt estim.clincial program for muscle spasms(2) electrodes applied to right shoulder upper trapezius and anterior aspect upper arm, biceps,intensity to tolerance with patient seated and right UE supported on pillow with moist heatpack applied to same; goal: pain, spasms; no adverse reactions  noted  Patient response to treatment: Patient demonstrated improved technique and motor control with all exercise with minimal assistance and VC. Improved soft tissue elasticity with decreased spasms noted by at least 50% following STM. Patient reported decreased soreness to mild following heat and modalities at end of session.            PT Education - 05/28/17 1723    Education provided Yes   Education Details re assessed home exercises   Person(s) Educated Patient   Methods Explanation   Comprehension Verbalized understanding             PT Long Term Goals - 05/28/17 1810      PT LONG TERM GOAL #1   Title Patient will demonstrate improved functional use right UE with decreased pain to mild with QuickDash score of 20% or less by 07/24/17   Baseline QuickDash 80%; 05/28/17 60%    Status Revised     PT LONG TERM GOAL #2   Title Patient will be independent with home program for pain control, flexibiltiy and strength by 07/24/2017 to allow self management following discharge from physical therapy    Baseline limited knowlege of appropriate pain control strategies, exercises and progression without moderate assistance   Status Revised               Plan - 05/28/17 1755    Clinical Impression Statement Patient is progressing steadily towards all goasl with improvemwnt noted in decreasing pain and improving AROM and AAROM. She continues with limited function with ADLs and household chores and will benefit from continued physical theray intervneiotn.    Rehab Potential Good   Clinical Impairments Affecting Rehab Potential (+)acute condition, motivated, prior level of function (-) chronic condition prior to surgery   PT Frequency 2x / week   PT Duration 8 weeks   PT Treatment/Interventions Patient/family education;Electrical Stimulation;Cryotherapy;Moist Heat;Neuromuscular re-education;Therapeutic exercise;Manual techniques   PT Next Visit Plan therapeutic exercise, pain  control; electrical stimulation   PT Home Exercise Plan scapular retraction, AAROM right UE, shoulder, scapular control exercises   Consulted and Agree with Plan of Care Patient      Patient will benefit from skilled therapeutic intervention in order to improve the following deficits and impairments:  Decreased strength, Pain, Impaired perceived functional ability, Decreased activity tolerance, Decreased endurance, Increased muscle spasms, Impaired UE functional use, Decreased range of motion  Visit Diagnosis: Acute pain of right shoulder - Plan: PT plan of care cert/re-cert  Muscle weakness (generalized) - Plan: PT plan of care cert/re-cert  Other muscle spasm - Plan: PT plan of care cert/re-cert     Problem List Patient Active Problem List   Diagnosis Date Noted  . Pre-operative examination 02/21/2017  . Low back pain 06/11/2016  . Osteopenia 09/30/2013  . Encounter for routine gynecological examination 01/18/2012  . Routine general medical examination at a health care facility 01/09/2012  . [place], [place] 02/06/2010  . DEGENERATIVE JOINT DISEASE, MILD 07/19/2009    Jomarie Longs PT 05/29/2017, 11:43 PM  [facility] [address] [place], [address] Phone:  [phone]   Fax:  [phone]  Name: Bridget [name] MRN: [id number] Date of Birth: 04-Feb-1956

## 2017-06-03 ENCOUNTER — Encounter: Payer: Self-pay | Admitting: Physical Therapy

## 2017-06-03 ENCOUNTER — Ambulatory Visit: Payer: 59 | Admitting: Physical Therapy

## 2017-06-03 DIAGNOSIS — M62838 Other muscle spasm: Secondary | ICD-10-CM | POA: Diagnosis not present

## 2017-06-03 DIAGNOSIS — M25511 Pain in right shoulder: Secondary | ICD-10-CM

## 2017-06-03 DIAGNOSIS — M6281 Muscle weakness (generalized): Secondary | ICD-10-CM

## 2017-06-03 NOTE — Therapy (Signed)
[facility] [address] [place], [address] Phone: [phone]   Fax:  [phone]  Physical Therapy Treatment  Patient Details  Name: Bridget [name] MRN: [id number] Date of Birth: 08-03-1956 Referring Provider: Thornton Park MD  Encounter Date: 06/03/2017      PT End of Session - 06/03/17 1731    Visit Number 17   Number of Visits 32   Date for PT Re-Evaluation 07/24/17   PT Start Time 9675   PT Stop Time 1823   PT Time Calculation (min) 58 min   Activity Tolerance Patient tolerated treatment well;Patient limited by pain   Behavior During Therapy [facility] for tasks assessed/performed      Past Medical History:  Diagnosis Date  . 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 Chronic back pain     Past Surgical History:  Procedure Laterality Date  . ANKLE SURGERY Right   . CESAREAN SECTION  1984 and 1986   hodp pre term labor CS  . OVARIAN CYST SURGERY  1973  . SHOULDER ARTHROSCOPY WITH OPEN ROTATOR CUFF REPAIR AND DISTAL CLAVICLE ACROMINECTOMY Right 03/19/2017   Procedure: SHOULDER ARTHROSCOPY WITH OPEN ROTATOR CUFF REPAIR AND SUBACROMINAL DECOMPRESSION;  Surgeon: Thornton Park, MD;  Location: [facility] ORS;  Service: Orthopedics;  Laterality: Right;  . TUBAL LIGATION  1999  . WRIST GANGLION EXCISION      There were no vitals filed for this visit.      Subjective Assessment - 06/03/17 1728    Subjective Patient reports she was very busy over the weekend and is still having stiffness and aching in right shoulder   Pertinent History May 2017 lifted a case of water and felt a pain in right shoulder. She had PT prior to surgery without lasting results and then had MRI and underwent RTC repair 03/19/2017. She also has back pain.    Limitations Lifting;House hold activities;Other (comment)  Work   Patient Stated Goals return to full functional use right UE to return to prior level of function   Currently in  Pain? Yes   Pain Score 2    Pain Location Shoulder   Pain Orientation Right   Pain Descriptors / Indicators Aching   Pain Type Surgical pain  03/19/2017   Pain Onset More than a month ago   Pain Frequency Intermittent         Objective: AROM: right shoulder sitting forward elevation0- 115with weakness and pain limiting further motion Palpation: + spasms right upper trapezius, increased tenderness along right biceps and posterior aspect of right shoulder; decreased from previous session  Treatment: Manual therapy: 12 min. With patient supinewith right UE supported;therapist performed superficial soft tissue mobilization to right uppertrapezius, cervical spine musculature, scalenes and paraspinal muscles and biceps muscle; Goal: decrease spasms, pain, improve soft tissue elasticity, decrease hiking of right shoulder  Therapeutic exercise: patient performed with assistance, demonstration and VC of therapist: Supine: AAROM right UE forward elevation to 140 degrees, ER/IR x 10 with UE supported by therapist in plane of scapula, multiple repetitions and sets as tolerated, required VC to relax during pain, muscle spasm Side lying left: Performed ER with right UE with towel between trunk and arm 10 reps Seated:  OMEGA bilateral scapular retraction 5# x 15 reps; standing straight arm pull downs with 10# and demonstration/VC 15 reps Standing: UE ranger on wall to 135 degrees flexion with minimal to no discomfort x 15 reps At wall with back  against wall to stabilize scapulae: ER x 5 reps through partial ROM due to increased pain Modalities:  Electrical stimulation: x 20 min.: Russian stim. Applied to medial border of right scapula and lower trapezius for muscle re education (~9ma), High volt estim.clincial program for muscle spasms(2) electrodes applied to right shoulder upper trapezius and anterior aspect upper arm, biceps,intensity to tolerance with patient seated and right  UE supported on pillow with moist heatpack applied to same; goal: pain, spasms; no adverse reactions noted  Patient response to treatment: Patient demonstrated improved technique and motor control with exercises with minimal VC and assistance.         PT Education - 06/03/17 1731    Education provided Yes   Education Details home program, pulleys, UE ranger    Person(s) Educated Patient   Methods Explanation   Comprehension Verbalized understanding             PT Long Term Goals - 05/28/17 1810      PT LONG TERM GOAL #1   Title Patient will demonstrate improved functional use right UE with decreased pain to mild with QuickDash score of 20% or less by 07/24/17   Baseline QuickDash 80%; 05/28/17 60%    Status Revised     PT LONG TERM GOAL #2   Title Patient will be independent with home program for pain control, flexibiltiy and strength by 07/24/2017 to allow self management following discharge from physical therapy    Baseline limited knowlege of appropriate pain control strategies, exercises and progression without moderate assistance   Status Revised               Plan - 06/03/17 1732    Clinical Impression Statement Patient is progressing well with ROM, strength right UE as demonstrated with improving AROM of shoulder into flexion and able to begin resistive exercises with good control.    Rehab Potential Good   Clinical Impairments Affecting Rehab Potential (+)acute condition, motivated, prior level of function (-) chronic condition prior to surgery   PT Frequency 2x / week   PT Duration 8 weeks   PT Treatment/Interventions Patient/family education;Electrical Stimulation;Cryotherapy;Moist Heat;Neuromuscular re-education;Therapeutic exercise;Manual techniques   PT Next Visit Plan therapeutic exercise, pain control; electrical stimulation   PT Home Exercise Plan scapular retraction, AAROM right UE, shoulder, scapular control exercises      Patient will benefit from  skilled therapeutic intervention in order to improve the following deficits and impairments:  Decreased strength, Pain, Impaired perceived functional ability, Decreased activity tolerance, Decreased endurance, Increased muscle spasms, Impaired UE functional use, Decreased range of motion  Visit Diagnosis: Acute pain of right shoulder  Muscle weakness (generalized)  Other muscle spasm     Problem List Patient Active Problem List   Diagnosis Date Noted  . Pre-operative examination 02/21/2017  . Low back pain 06/11/2016  . Osteopenia 09/30/2013  . Encounter for routine gynecological examination 01/18/2012  . Routine general medical examination at a health care facility 01/09/2012  . [place], [place] 02/06/2010  . DEGENERATIVE JOINT DISEASE, MILD 07/19/2009    Jomarie Longs PT 06/04/2017, 6:31 PM  [facility] [address] [place], [address] Phone: [phone]   Fax:  [phone]  Name: ARCELIA PALS MRN: [id number] Date of Birth: 06/23/1956

## 2017-06-05 ENCOUNTER — Ambulatory Visit: Payer: 59 | Admitting: Physical Therapy

## 2017-06-05 ENCOUNTER — Encounter: Payer: 59 | Admitting: Physical Therapy

## 2017-06-06 ENCOUNTER — Ambulatory Visit: Payer: 59 | Admitting: Physical Therapy

## 2017-06-06 ENCOUNTER — Encounter: Payer: Self-pay | Admitting: Physical Therapy

## 2017-06-06 DIAGNOSIS — M62838 Other muscle spasm: Secondary | ICD-10-CM

## 2017-06-06 DIAGNOSIS — M25511 Pain in right shoulder: Secondary | ICD-10-CM | POA: Diagnosis not present

## 2017-06-06 DIAGNOSIS — M6281 Muscle weakness (generalized): Secondary | ICD-10-CM

## 2017-06-07 NOTE — Therapy (Signed)
[facility] [address], [place], [address] Phone: [phone]   Fax:  [phone]  Physical Therapy Treatment  Patient Details  Name: Bridget [name] MRN: [id number] Date of Birth: Feb 24, 1956 Referring Provider: Thornton Park MD  Encounter Date: 06/06/2017      PT End of Session - 06/06/17 1633    Visit Number 18   Number of Visits 32   Date for PT Re-Evaluation 07/24/17   PT Start Time 8588   PT Stop Time 1628   PT Time Calculation (min) 42 min   Activity Tolerance Patient tolerated treatment well;Patient limited by pain   Behavior During Therapy [facility] for tasks assessed/performed      Past Medical History:  Diagnosis Date  . 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 Chronic back pain     Past Surgical History:  Procedure Laterality Date  . ANKLE SURGERY Right   . CESAREAN SECTION  1984 and 1986   hodp pre term labor CS  . OVARIAN CYST SURGERY  1973  . SHOULDER ARTHROSCOPY WITH OPEN ROTATOR CUFF REPAIR AND DISTAL CLAVICLE ACROMINECTOMY Right 03/19/2017   Procedure: SHOULDER ARTHROSCOPY WITH OPEN ROTATOR CUFF REPAIR AND SUBACROMINAL DECOMPRESSION;  Surgeon: Thornton Park, MD;  Location: [facility] ORS;  Service: Orthopedics;  Laterality: Right;  . TUBAL LIGATION  1999  . WRIST GANGLION EXCISION      There were no vitals filed for this visit.      Subjective Assessment - 06/06/17 1551    Subjective Patient reports less soreness in right shoulder today and has mild soreness today. She is exercising as instructed and has concernes about swelling in right palm today, will consult with MD next week at appointment.    Pertinent History May 2017 lifted a case of water and felt a pain in right shoulder. She had PT prior to surgery without lasting results and then had MRI and underwent RTC repair 03/19/2017. She also has back pain.    Limitations Lifting;House hold activities;Other (comment)  Work   Patient Stated Goals return to full functional use right UE to return to prior level of function   Currently in Pain? Other (Comment)  discomfort   Pain Onset More than a month ago         Objective:    AROM: right shoulder sitting forward    elevation0-120with weakness limiting further motion    Palpation: + spasms right upper trapezius, increased    tenderness along right biceps and posterior aspect of    right shoulder; decreased from previous session    Treatment: Manual therapy: 8 min. With patient supinewith right UE supported;therapist performed superficial soft tissue mobilization to right uppertrapezius, cervical spine musculature, scalenes and paraspinal muscles and biceps muscle; Goal: decrease spasms, pain, improve soft tissue elasticity, decrease hiking of right shoulder  Therapeutic exercise: patient performed with assistance, demonstration and VC of therapist: Supine: AAROM right UE forward elevation to 145 degrees, ER/IR x 10 with UE supported by therapist in plane of scapula, multiple repetitions and sets as tolerated,  Standing: OMEGA bilateral scapular retraction 5# x 15 reps; standing straight arm pull downs with 10# and demonstration/VC 15 reps Standing: UE ranger on wall to 140 degrees flexion with minimal to no discomfort 2 x 15 reps  Modalities:  Electrical stimulation: x 18 min.: Russian stim. Applied to medial border of right scapula and lower trapezius for muscle re education (~68ma), High volt estim.clincial program for  muscle spasms(2) electrodes applied to right shoulder upper trapezius and posterior aspect upper arm, intensity to tolerance with patient seated and right UE supported on pillow with moist heatpack applied to same; goal: pain, spasms; no adverse reactions noted  Patient response to treatment: Patient demonstrated improved technique with exercises with minimal VC and assistance for correct alignment.  Patient with decreased spasms by  at least 30% following STM. Improved motor control with repetition.        PT Education - 06/06/17 1600    Education provided Yes   Education Details correct alignment of shoulder with exercises, correct technique   Person(s) Educated Patient   Methods Explanation;Demonstration;Verbal cues   Comprehension Verbalized understanding;Returned demonstration;Verbal cues required             PT Long Term Goals - 05/28/17 1810      PT LONG TERM GOAL #1   Title Patient will demonstrate improved functional use right UE with decreased pain to mild with QuickDash score of 20% or less by 07/24/17   Baseline QuickDash 80%; 05/28/17 60%    Status Revised     PT LONG TERM GOAL #2   Title Patient will be independent with home program for pain control, flexibiltiy and strength by 07/24/2017 to allow self management following discharge from physical therapy    Baseline limited knowlege of appropriate pain control strategies, exercises and progression without moderate assistance   Status Revised               Plan - 06/06/17 1633    Clinical Impression Statement Patient is progressing well with ROM, strength right UE as demonstrated with improving AROM of shoulder into flexion. She continues with limited ROM, strength and requires assistance to complete exercises with correct technique and shoulder alignment and will require additional physical therapy intervention to achieve goals.    Rehab Potential Good   Clinical Impairments Affecting Rehab Potential (+)acute condition, motivated, prior level of function (-) chronic condition prior to surgery   PT Frequency 2x / week   PT Duration 8 weeks   PT Treatment/Interventions Patient/family education;Electrical Stimulation;Cryotherapy;Moist Heat;Neuromuscular re-education;Therapeutic exercise;Manual techniques   PT Next Visit Plan therapeutic exercise, pain control; electrical stimulation   PT Home Exercise Plan scapular retraction, AAROM right UE,  shoulder, scapular control exercises      Patient will benefit from skilled therapeutic intervention in order to improve the following deficits and impairments:  Decreased strength, Pain, Impaired perceived functional ability, Decreased activity tolerance, Decreased endurance, Increased muscle spasms, Impaired UE functional use, Decreased range of motion  Visit Diagnosis: Muscle weakness (generalized)  Other muscle spasm  Acute pain of right shoulder     Problem List Patient Active Problem List   Diagnosis Date Noted  . Pre-operative examination 02/21/2017  . Low back pain 06/11/2016  . Osteopenia 09/30/2013  . Encounter for routine gynecological examination 01/18/2012  . Routine general medical examination at a health care facility 01/09/2012  . [place], [place] 02/06/2010  . DEGENERATIVE JOINT DISEASE, MILD 07/19/2009    Jomarie Longs PT 06/07/2017, 11:58 AM  [facility] [address] [place], [address] Phone: [phone]   Fax:  [phone]  Name: HETHER ANSELMO MRN: [id number] Date of Birth: [birth year]

## 2017-06-10 ENCOUNTER — Encounter: Payer: Self-pay | Admitting: Physical Therapy

## 2017-06-10 ENCOUNTER — Ambulatory Visit: Payer: 59 | Admitting: Physical Therapy

## 2017-06-10 DIAGNOSIS — M6281 Muscle weakness (generalized): Secondary | ICD-10-CM

## 2017-06-10 DIAGNOSIS — M62838 Other muscle spasm: Secondary | ICD-10-CM | POA: Diagnosis not present

## 2017-06-10 DIAGNOSIS — M25511 Pain in right shoulder: Secondary | ICD-10-CM | POA: Diagnosis not present

## 2017-06-10 NOTE — Therapy (Signed)
[facility] [address], [place], [address] Phone: [phone]   Fax:  [phone]  Physical Therapy Treatment  Patient Details  Name: Bridget [name] MRN: [id number] Date of Birth: [birth year] Referring Provider: Thornton Park MD  Encounter Date: 06/10/2017      PT End of Session - 06/10/17 1843    Visit Number 19   Number of Visits 32   Date for PT Re-Evaluation 07/24/17   PT Start Time 7989   PT Stop Time 1830   PT Time Calculation (min) 63 min   Activity Tolerance Patient tolerated treatment well;Patient limited by pain   Behavior During Therapy [facility] for tasks assessed/performed      Past Medical History:  Diagnosis Date  . 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 Chronic back pain     Past Surgical History:  Procedure Laterality Date  . ANKLE SURGERY Right   . CESAREAN SECTION  1984 and 1986   hodp pre term labor CS  . OVARIAN CYST SURGERY  1973  . SHOULDER ARTHROSCOPY WITH OPEN ROTATOR CUFF REPAIR AND DISTAL CLAVICLE ACROMINECTOMY Right 03/19/2017   Procedure: SHOULDER ARTHROSCOPY WITH OPEN ROTATOR CUFF REPAIR AND SUBACROMINAL DECOMPRESSION;  Surgeon: Thornton Park, MD;  Location: [facility] ORS;  Service: Orthopedics;  Laterality: Right;  . TUBAL LIGATION  1999  . WRIST GANGLION EXCISION      There were no vitals filed for this visit.      Subjective Assessment - 06/10/17 1727    Subjective Patient reports less soreness in right shoulder today. She is sore in front of shoulder and back of shoulder. She is exercising with pulley and exercises as instructed at home. .    Pertinent History May 2017 lifted a case of water and felt a pain in right shoulder. She had PT prior to surgery without lasting results and then had MRI and underwent RTC repair 03/19/2017. She also has back pain.    Limitations Lifting;House hold activities;Other (comment)  Work   Patient Stated Goals return to full  functional use right UE to return to prior level of function   Currently in Pain? Other (Comment)  discomfort         Objective:    AROM: right shoulder sitting forward elevation0-120with weakness limiting further motion    Palpation: + spasms right upper trapezius, increased tenderness along right biceps and posterior aspect of right shoulder, pectoral muscle    Treatment: Manual therapy: 10 min. With patient supinewith right UE supported;therapist performed superficial soft tissue mobilization to right uppertrapezius, cervical spine musculature, scalenes and paraspinal muscles and biceps muscle; Goal: decrease spasms, pain, improve soft tissue elasticity, decrease hiking of right shoulder  Therapeutic exercise: patient performed with assistance, demonstration and VC of therapist: Supine: AAROM right UE forward elevation to 145degrees, ER/IR x 10 through full ROM with UE supported by therapist in plane of scapula, multiple repetitions and sets as tolerated AAROM right UE forward elevation with 1# weight held in both hands, short arc 90 - 130 within mild pain limits <3/10.  Standing: OMEGA bilateral scapular retraction 5# x 15 reps; standing straight arm pull downs with 10# and demonstration/VC 15 reps Seated reverse chin ups with 10# x 15 reps Standing: UE ranger on wall to 140 degrees flexion with minimal to no discomfort 2 x 15 reps  Modalities:  Electrical stimulation: x 20 min.: Russian stim. Applied to medial border of right scapula and lower trapezius  for muscle re education (~61ma), High volt estim.clincial program for muscle spasms(2) electrodes applied to right shoulder anterior and posterior aspect upper arm, intensity to tolerance with patient seated and right UE supported on pillow with moist heatpack applied to same; goal: pain, spasms; no adverse reactions notted   Patient response to treatment: Patient with decreased spasms by 50% and less discomfort with  overhead motions. Patient demonstrated improved technique and ROM with repetition and assistance.        PT Education - 06/10/17 1728    Education provided Yes   Education Details HEP continue with AAROM and AROM supine with 1# weight held between hands through short arc overhead   Person(s) Educated Patient   Methods Explanation;Verbal cues;Demonstration             PT Long Term Goals - 05/28/17 1810      PT LONG TERM GOAL #1   Title Patient will demonstrate improved functional use right UE with decreased pain to mild with QuickDash score of 20% or less by 07/24/17   Baseline QuickDash 80%; 05/28/17 60%    Status Revised     PT LONG TERM GOAL #2   Title Patient will be independent with home program for pain control, flexibiltiy and strength by 07/24/2017 to allow self management following discharge from physical therapy    Baseline limited knowlege of appropriate pain control strategies, exercises and progression without moderate assistance   Status Revised               Plan - 06/10/17 1844    Clinical Impression Statement Patient is progressing well with AROM, AAROM and strength. She continues with limitations of spasms and pain with overhead motion and will benefit from additional physical therapy intervention to address limitations and achieve goals and maximal functional use of right UE.    Rehab Potential Good   Clinical Impairments Affecting Rehab Potential (+)acute condition, motivated, prior level of function (-) chronic condition prior to surgery   PT Frequency 2x / week   PT Duration 8 weeks   PT Treatment/Interventions Patient/family education;Electrical Stimulation;Cryotherapy;Moist Heat;Neuromuscular re-education;Therapeutic exercise;Manual techniques   PT Next Visit Plan therapeutic exercise, pain control; electrical stimulation   PT Home Exercise Plan scapular retraction, AAROM right UE, shoulder, scapular control exercises      Patient will benefit from  skilled therapeutic intervention in order to improve the following deficits and impairments:  Decreased strength, Pain, Impaired perceived functional ability, Decreased activity tolerance, Decreased endurance, Increased muscle spasms, Impaired UE functional use, Decreased range of motion  Visit Diagnosis: Muscle weakness (generalized)  Other muscle spasm  Acute pain of right shoulder     Problem List Patient Active Problem List   Diagnosis Date Noted  . Pre-operative examination 02/21/2017  . Low back pain 06/11/2016  . Osteopenia 09/30/2013  . Encounter for routine gynecological examination 01/18/2012  . Routine general medical examination at a health care facility 01/09/2012  . [place], [place] 02/06/2010  . DEGENERATIVE JOINT DISEASE, MILD 07/19/2009    Jomarie Longs PT 06/10/2017, 6:47 PM  [facility] [address] [place], [address] Phone: [phone]   Fax:  [phone]  Name: LAKETHA LEOPARD MRN: [id number] Date of Birth: 01/14/1956

## 2017-06-12 ENCOUNTER — Ambulatory Visit: Payer: 59 | Admitting: Physical Therapy

## 2017-06-12 ENCOUNTER — Encounter: Payer: Self-pay | Admitting: Physical Therapy

## 2017-06-12 DIAGNOSIS — M25511 Pain in right shoulder: Secondary | ICD-10-CM

## 2017-06-12 DIAGNOSIS — M6281 Muscle weakness (generalized): Secondary | ICD-10-CM | POA: Diagnosis not present

## 2017-06-12 DIAGNOSIS — M62838 Other muscle spasm: Secondary | ICD-10-CM

## 2017-06-12 NOTE — Therapy (Signed)
[place] [facility] [address] [place], [address] Phone: [phone]   Fax:  [phone]  Physical Therapy Treatment  Patient Details  Name: Bridget [name] MRN: [id number] Date of Birth: December 02, 1956 Referring Provider: Thornton Park MD  Encounter Date: 06/12/2017      PT End of Session - 06/12/17 1306    Visit Number 20   Number of Visits 32   Date for PT Re-Evaluation 07/24/17   PT Start Time 1302   PT Stop Time 1400   PT Time Calculation (min) 58 min   Activity Tolerance Patient tolerated treatment well;Patient limited by pain   Behavior During Therapy [facility] for tasks assessed/performed      Past Medical History:  Diagnosis Date  . 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 Chronic back pain     Past Surgical History:  Procedure Laterality Date  . ANKLE SURGERY Right   . CESAREAN SECTION  1984 and 1986   hodp pre term labor CS  . OVARIAN CYST SURGERY  1973  . SHOULDER ARTHROSCOPY WITH OPEN ROTATOR CUFF REPAIR AND DISTAL CLAVICLE ACROMINECTOMY Right 03/19/2017   Procedure: SHOULDER ARTHROSCOPY WITH OPEN ROTATOR CUFF REPAIR AND SUBACROMINAL DECOMPRESSION;  Surgeon: Thornton Park, MD;  Location: [facility] ORS;  Service: Orthopedics;  Laterality: Right;  . TUBAL LIGATION  1999  . WRIST GANGLION EXCISION      There were no vitals filed for this visit.      Subjective Assessment - 06/12/17 1304    Subjective Patient reports she is doing well with right shoulder today and has mild soreness only.    Pertinent History May 2017 lifted a case of water and felt a pain in right shoulder. She had PT prior to surgery without lasting results and then had MRI and underwent RTC repair 03/19/2017. She also has back pain.    Limitations Lifting;House hold activities;Other (comment)  Work   Patient Stated Goals return to full functional use right UE to return to prior level of function   Currently in Pain? Other (Comment)   discomfort         Objective: AROM: right shoulder sitting forward elevation0-120with weakness limiting further motion    AAROM: right shoulder supine lying: ER WNL, forward elevation 0-145, IR 60 Palpation: + spasms right upper trapezius, increased tenderness along right biceps and posterior aspect of right shoulder, pectoral muscle  Treatment: Manual therapy: 10 min. With patient supinewith right UE supported;therapist performed superficial soft tissue mobilization to right uppertrapezius, cervical spine musculature, scalenes and paraspinal muscles and biceps muscle; Goal: decrease spasms, pain, improve soft tissue elasticity, decrease hiking of right shoulder  Therapeutic exercise: patient performed with assistance, demonstration and VC of therapist: Supine: AAROM right UE forward elevation to 145degrees, ER/IR x 10 through full ROM with UE supported by therapist in plane of scapula, multiple repetitions and sets as tolerated AAROM right UE forward elevation with 2# weight held in both hands, short arc 90 - 130 within mild pain limits <3/10.  Standing: OMEGA bilateral scapular retraction 5# x 15 reps; standing straight arm pull downs with 10# and demonstration/VC 15 reps Seated reverse chin ups with 10# x 15 reps Standing: UE ranger on wall to 140degrees flexion with minimal to no discomfort when supporting front of shoulder  2x 15 reps stabilization exercise on wall with ball for scapular control 10 cw/ccw  Modalities:  Electrical stimulation: x 56min.: Russian stim. Applied to medial border of  right scapula and lower trapezius for muscle re education (~28ma), High volt estim.clincial program for muscle spasms(2) electrodes applied to right shoulder anterior and posterior aspect upper arm, intensity to tolerance with patient seated and right UE supported on pillow with coldpack applied to same; goal: pain, spasms; no adverse reactions noted   Patient  response to treatment: Patient demonstrated improved technique with exercises with minimal VC and demonstration. Decreased spasms by at least 50% following STM and limited motion to above 90 degrees for flexion exercises supine to avoid increased pain. Improved motor control, scapular stabilization, noted with all exercises with repetition.           PT Education - 06/12/17 1305    Education provided Yes   Education Details HEP re assessed   Person(s) Educated Patient   Methods Explanation;Demonstration   Comprehension Verbalized understanding;Returned demonstration;Verbal cues required             PT Long Term Goals - 05/28/17 1810      PT LONG TERM GOAL #1   Title Patient will demonstrate improved functional use right UE with decreased pain to mild with QuickDash score of 20% or less by 07/24/17   Baseline QuickDash 80%; 05/28/17 60%    Status Revised     PT LONG TERM GOAL #2   Title Patient will be independent with home program for pain control, flexibiltiy and strength by 07/24/2017 to allow self management following discharge from physical therapy    Baseline limited knowlege of appropriate pain control strategies, exercises and progression without moderate assistance   Status Revised               Plan - 06/12/17 1306    Clinical Impression Statement Patient demonstrates steady improvement with ROM, strength and is able to use right UE with less difficulty for daily tasks includiing personal care and work related tasks, with modifications to avoid lifting heavy objects or raising right arm above shoulder level excessively due to pain. She is compliant with home program and should continue to progress with additional physical therapy intervention.    Rehab Potential Good   Clinical Impairments Affecting Rehab Potential (+)acute condition, motivated, prior level of function (-) chronic condition prior to surgery   PT Frequency 2x / week   PT Duration 8 weeks   PT  Treatment/Interventions Patient/family education;Electrical Stimulation;Cryotherapy;Moist Heat;Neuromuscular re-education;Therapeutic exercise;Manual techniques   PT Next Visit Plan therapeutic exercise, pain control; electrical stimulation   PT Home Exercise Plan scapular retraction, AAROM right UE, shoulder, scapular control exercises; isomectrics at wall, light resistive exercises 1-2# weight supine forward flexion      Patient will benefit from skilled therapeutic intervention in order to improve the following deficits and impairments:  Decreased strength, Pain, Impaired perceived functional ability, Decreased activity tolerance, Decreased endurance, Increased muscle spasms, Impaired UE functional use, Decreased range of motion  Visit Diagnosis: Muscle weakness (generalized)  Other muscle spasm  Acute pain of right shoulder     Problem List Patient Active Problem List   Diagnosis Date Noted  . Pre-operative examination 02/21/2017  . Low back pain 06/11/2016  . Osteopenia 09/30/2013  . Encounter for routine gynecological examination 01/18/2012  . Routine general medical examination at a health care facility 01/09/2012  . [place], [place] 02/06/2010  . DEGENERATIVE JOINT DISEASE, MILD 07/19/2009    Jomarie Longs PT 06/13/2017, 8:29 AM  [place] [facility] [address], [place], [address] Phone: [phone]  Fax:  [phone]  Name: Bridget [name] MRN: [id number] Date of Birth: 1956/03/16

## 2017-06-14 DIAGNOSIS — M75111 Incomplete rotator cuff tear or rupture of right shoulder, not specified as traumatic: Secondary | ICD-10-CM | POA: Diagnosis not present

## 2017-06-17 ENCOUNTER — Ambulatory Visit: Payer: 59 | Admitting: Physical Therapy

## 2017-06-18 ENCOUNTER — Encounter: Payer: 59 | Admitting: Physical Therapy

## 2017-06-19 ENCOUNTER — Encounter: Payer: 59 | Admitting: Physical Therapy

## 2017-06-20 ENCOUNTER — Encounter: Payer: 59 | Admitting: Physical Therapy

## 2017-06-24 ENCOUNTER — Ambulatory Visit: Payer: 59 | Attending: Orthopedic Surgery | Admitting: Physical Therapy

## 2017-06-24 ENCOUNTER — Encounter: Payer: Self-pay | Admitting: Physical Therapy

## 2017-06-24 DIAGNOSIS — M6281 Muscle weakness (generalized): Secondary | ICD-10-CM | POA: Diagnosis not present

## 2017-06-24 DIAGNOSIS — M62838 Other muscle spasm: Secondary | ICD-10-CM | POA: Insufficient documentation

## 2017-06-24 DIAGNOSIS — M25511 Pain in right shoulder: Secondary | ICD-10-CM | POA: Insufficient documentation

## 2017-06-24 NOTE — Therapy (Signed)
[place] [facility] [address] [place], [address] Phone: [phone]   Fax:  [phone]  Physical Therapy Treatment  Patient Details  Name: Bridget [name] MRN: [id number] Date of Birth: [birth year] Referring Provider: Thornton Park MD  Encounter Date: 06/24/2017      PT End of Session - 06/24/17 1728    Visit Number 21   Number of Visits 32   Date for PT Re-Evaluation 07/24/17   PT Start Time 5809   PT Stop Time 1820   PT Time Calculation (min) 59 min   Activity Tolerance Patient tolerated treatment well;Patient limited by pain   Behavior During Therapy [facility] for tasks assessed/performed      Past Medical History:  Diagnosis Date  . 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 Chronic back pain     Past Surgical History:  Procedure Laterality Date  . ANKLE SURGERY Right   . CESAREAN SECTION  1984 and 1986   hodp pre term labor CS  . OVARIAN CYST SURGERY  1973  . SHOULDER ARTHROSCOPY WITH OPEN ROTATOR CUFF REPAIR AND DISTAL CLAVICLE ACROMINECTOMY Right 03/19/2017   Procedure: SHOULDER ARTHROSCOPY WITH OPEN ROTATOR CUFF REPAIR AND SUBACROMINAL DECOMPRESSION;  Surgeon: Thornton Park, MD;  Location: [facility] ORS;  Service: Orthopedics;  Laterality: Right;  . TUBAL LIGATION  1999  . WRIST GANGLION EXCISION      There were no vitals filed for this visit.      Subjective Assessment - 06/24/17 1725    Subjective Patient reports she is feeling stiffness in right shoulder and is still careful with how she moves her right arm. Patient is lying on right side at night and just got back from vacation and may have slept on right shoulder to aggravate it.    Pertinent History May 2017 lifted a case of water and felt a pain in right shoulder. She had PT prior to surgery without lasting results and then had MRI and underwent RTC repair 03/19/2017. She also has back pain.    Limitations Lifting;House hold activities;Other  (comment)  Work   Patient Stated Goals return to full functional use right UE to return to prior level of function   Currently in Pain? Other (Comment)  discomfort        Objective:    AROM: right shoulder 0-110 degrees, limited by pain, weakness    Palpation: right shoulder: point tenderness anterior aspect over bicep tendon    Special tests: right shoulder (+) for impingement and (+)speed's test for biceps      Treatment: Manual therapy: 28 min. With patient supinewith right UE supported;therapist performed superficial soft tissue mobilization to right uppertrapezius, cervical spine musculature, scalenes and paraspinal muscles and biceps muscle; Goal: decrease spasms, pain, improve soft tissue elasticity,improve pain free ROM  Modalities:  Electrical stimulation: x 65min.: Russian stim. Applied to medial border of right scapula and lower trapezius for muscle re education (~70ma), High volt estim.clincial program for muscle spasms(2) electrodes applied to right shoulder anterior aspect upper arm, intensity to tolerance with patient seated and right UE supported on pillow with coldpack applied to same; goal: pain, spasms; no adverse reactions noted   Patient response to treatment: Patient with mild decrease in pain in right shoulder following treatment. She demonstrated decreased spasms in upper trapezius and right    shoulder with STM and modalities.         PT Education - 06/24/17 1727  Education provided Yes   Education Details HEP: ROM and strengthening re inforced    Person(s) Educated Patient   Methods Explanation;Demonstration;Verbal cues   Comprehension Verbalized understanding;Returned demonstration;Verbal cues required             PT Long Term Goals - 05/28/17 1810      PT LONG TERM GOAL #1   Title Patient will demonstrate improved functional use right UE with decreased pain to mild with QuickDash score of 20% or less by 07/24/17   Baseline QuickDash  80%; 05/28/17 60%    Status Revised     PT LONG TERM GOAL #2   Title Patient will be independent with home program for pain control, flexibiltiy and strength by 07/24/2017 to allow self management following discharge from physical therapy    Baseline limited knowlege of appropriate pain control strategies, exercises and progression without moderate assistance   Status Revised               Plan - 06/24/17 1728    Clinical Impression Statement Patient continues with limited ROM and strength s/p RTC surgery with symptoms of tendonitis right shoulder. She responded with mild decrease in symptoms with treatment and was instructed to focus on pain control and decreasing inflammation until next session.    Rehab Potential Good   Clinical Impairments Affecting Rehab Potential (+)acute condition, motivated, prior level of function (-) chronic condition prior to surgery   PT Frequency 2x / week   PT Duration 8 weeks   PT Treatment/Interventions Patient/family education;Electrical Stimulation;Cryotherapy;Moist Heat;Neuromuscular re-education;Therapeutic exercise;Manual techniques   PT Next Visit Plan therapeutic exercise, pain control; electrical stimulation   PT Home Exercise Plan scapular retraction, AAROM right UE, shoulder, scapular control exercises; isomectrics at wall, light resistive exercises 1-2# weight supine forward flexion      Patient will benefit from skilled therapeutic intervention in order to improve the following deficits and impairments:  Decreased strength, Pain, Impaired perceived functional ability, Decreased activity tolerance, Decreased endurance, Increased muscle spasms, Impaired UE functional use, Decreased range of motion  Visit Diagnosis: Other muscle spasm  Acute pain of right shoulder  Muscle weakness (generalized)     Problem List Patient Active Problem List   Diagnosis Date Noted  . Pre-operative examination 02/21/2017  . Low back pain 06/11/2016  .  Osteopenia 09/30/2013  . Encounter for routine gynecological examination 01/18/2012  . Routine general medical examination at a health care facility 01/09/2012  . [place], [place] 02/06/2010  . DEGENERATIVE JOINT DISEASE, MILD 07/19/2009    Jomarie Longs PT 06/24/2017, 6:51 PM  [place] [facility] [address] [place], [address] Phone: [phone]   Fax:  [phone]  Name: Bridget [name] MRN: [id number] Date of Birth: [birth year]

## 2017-06-27 ENCOUNTER — Ambulatory Visit: Payer: 59 | Admitting: Physical Therapy

## 2017-06-27 ENCOUNTER — Encounter: Payer: Self-pay | Admitting: Physical Therapy

## 2017-06-27 DIAGNOSIS — M6281 Muscle weakness (generalized): Secondary | ICD-10-CM | POA: Diagnosis not present

## 2017-06-27 DIAGNOSIS — M25511 Pain in right shoulder: Secondary | ICD-10-CM | POA: Diagnosis not present

## 2017-06-27 DIAGNOSIS — M75111 Incomplete rotator cuff tear or rupture of right shoulder, not specified as traumatic: Secondary | ICD-10-CM | POA: Diagnosis not present

## 2017-06-27 DIAGNOSIS — M62838 Other muscle spasm: Secondary | ICD-10-CM

## 2017-06-27 NOTE — Therapy (Signed)
[place] [facility] [address], [place], [address] Phone: [phone]   Fax:  [phone]  Physical Therapy Treatment  Patient Details  Name: Bridget [name] MRN: [id number] Date of Birth: Feb 16, 1956 Referring Provider: Thornton Park MD  Encounter Date: 06/27/2017      PT End of Session - 06/27/17 1817    Visit Number 22   Number of Visits 32   Date for PT Re-Evaluation 07/24/17   PT Start Time 1702   PT Stop Time 1750   PT Time Calculation (min) 48 min   Activity Tolerance Patient tolerated treatment well;Patient limited by pain   Behavior During Therapy [facility] for tasks assessed/performed      Past Medical History:  Diagnosis Date  . 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 Chronic back pain     Past Surgical History:  Procedure Laterality Date  . ANKLE SURGERY Right   . CESAREAN SECTION  1984 and 1986   hodp pre term labor CS  . OVARIAN CYST SURGERY  1973  . SHOULDER ARTHROSCOPY WITH OPEN ROTATOR CUFF REPAIR AND DISTAL CLAVICLE ACROMINECTOMY Right 03/19/2017   Procedure: SHOULDER ARTHROSCOPY WITH OPEN ROTATOR CUFF REPAIR AND SUBACROMINAL DECOMPRESSION;  Surgeon: Thornton Park, MD;  Location: [facility] ORS;  Service: Orthopedics;  Laterality: Right;  . TUBAL LIGATION  1999  . WRIST GANGLION EXCISION      There were no vitals filed for this visit.      Subjective Assessment - 06/27/17 1710    Subjective Patient reports she is still having right shoulder pain. She reports she may have aggravated pain while on vacation and using right UE for Frisbee throwing (once) and other activities using right UE even though she did not perform a lot of activity.   Pertinent History May 2017 lifted a case of water and felt a pain in right shoulder. She had PT prior to surgery without lasting results and then had MRI and underwent RTC repair 03/19/2017. She also has back pain.    Limitations Lifting;House hold  activities;Other (comment)  Work   Patient Stated Goals return to full functional use right UE to return to prior level of function   Currently in Pain? Other (Comment)  discomfort         Objective:    AROM: right shoulder 0-125 degrees, limited by pain, weakness    Palpation: right shoulder: point tenderness anterior aspect over bicep tendon  Treatment: Manual therapy: 20 min. With patient supine and prone lyingwith right UE supported;therapist performed superficial soft tissue mobilization to right shoulder, uppertrapezius, cervical spine musculature, paraspinal muscles and biceps muscle; Goal: decrease spasms, pain, improve soft tissue elasticity,improve pain free ROM  Therapeutic exercise: patient performed with assistance, demonstration and VC of therapist: Side lying left:  right shoulder abduction with assistance x 10 Prone lying right shoulder row x 10, horizontal abduction with assistance and VC to perform through pain free ROM and short arc; x 10 reps  Modalities:  Electrical stimulation: x 36min.:  High volt estim.clincial program for muscle spasms(2) electrodes applied to right shoulder anterior aspect upper arm and posterior aspect of shoulder over teres muscles, intensity to tolerance with patient seated and right UE supported on pillow with coldpack applied to same; goal: pain, spasms; no adverse reactions noted   Patient response to treatment: Patient able to perform exercises with guidance, assistance and VC of therapist. Patient with mild decrease in pain with treatment.  PT Education - 06/27/17 1716    Education provided Yes   Education Details re inforced exercises and pain control; added prone row and horizontal abduction short arc exercises, continue with pulleys and pendulums    Person(s) Educated Patient   Methods Explanation;Demonstration;Verbal cues;Handout   Comprehension Verbalized understanding;Returned demonstration;Verbal cues  required             PT Long Term Goals - 05/28/17 1810      PT LONG TERM GOAL #1   Title Patient will demonstrate improved functional use right UE with decreased pain to mild with QuickDash score of 20% or less by 07/24/17   Baseline QuickDash 80%; 05/28/17 60%    Status Revised     PT LONG TERM GOAL #2   Title Patient will be independent with home program for pain control, flexibiltiy and strength by 07/24/2017 to allow self management following discharge from physical therapy    Baseline limited knowlege of appropriate pain control strategies, exercises and progression without moderate assistance   Status Revised               Plan - 06/27/17 1836    Clinical Impression Statement Continues with pain and limitations in right shoulder consistent with inflammation. Improved AROM from previous session. Patient demonstrates slow progress towards goals with increased pain and limited ROM. Patient will benefit from continued physical therapy intervention to address limitations and achieve goals.    Rehab Potential Good   Clinical Impairments Affecting Rehab Potential (+)acute condition, motivated, prior level of function (-) chronic condition prior to surgery   PT Frequency 2x / week   PT Duration 8 weeks   PT Treatment/Interventions Patient/family education;Electrical Stimulation;Cryotherapy;Moist Heat;Neuromuscular re-education;Therapeutic exercise;Manual techniques   PT Next Visit Plan therapeutic exercise, pain control; electrical stimulation   PT Home Exercise Plan scapular retraction, AAROM right UE, shoulder, scapular control exercises; isomectrics at wall, light resistive exercises 1-2# weight supine forward flexion      Patient will benefit from skilled therapeutic intervention in order to improve the following deficits and impairments:  Decreased strength, Pain, Impaired perceived functional ability, Decreased activity tolerance, Decreased endurance, Increased muscle spasms,  Impaired UE functional use, Decreased range of motion  Visit Diagnosis: Acute pain of right shoulder  Other muscle spasm  Muscle weakness (generalized)     Problem List Patient Active Problem List   Diagnosis Date Noted  . Pre-operative examination 02/21/2017  . Low back pain 06/11/2016  . Osteopenia 09/30/2013  . Encounter for routine gynecological examination 01/18/2012  . Routine general medical examination at a health care facility 01/09/2012  . [place], [place] 02/06/2010  . DEGENERATIVE JOINT DISEASE, MILD 07/19/2009    Jomarie Longs PT 06/27/2017, 6:37 PM  [place] [facility] [address], [place], [address] Phone: [phone]   Fax:  [phone]  Name: Bridget [name] MRN: [id number] Date of Birth: 1956/09/01

## 2017-07-01 ENCOUNTER — Ambulatory Visit: Payer: 59 | Admitting: Physical Therapy

## 2017-07-01 DIAGNOSIS — M25511 Pain in right shoulder: Secondary | ICD-10-CM

## 2017-07-01 DIAGNOSIS — M6281 Muscle weakness (generalized): Secondary | ICD-10-CM | POA: Diagnosis not present

## 2017-07-01 DIAGNOSIS — M62838 Other muscle spasm: Secondary | ICD-10-CM

## 2017-07-02 NOTE — Therapy (Signed)
[facility] [address] [place], [address] Phone: [phone]   Fax:  [phone]  Physical Therapy Treatment  Patient Details  Name: Bridget [name] MRN: [id number] Date of Birth: 1956-07-19 Referring Provider: Thornton Park MD  Encounter Date: 07/01/2017      PT End of Session - 07/01/17 1723    Visit Number 23   Number of Visits 32   Date for PT Re-Evaluation 07/24/17   PT Start Time 0240   PT Stop Time 1805   PT Time Calculation (min) 46 min   Activity Tolerance Patient tolerated treatment well;Patient limited by pain   Behavior During Therapy [facility] for tasks assessed/performed      Past Medical History:  Diagnosis Date  . 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 Chronic back pain     Past Surgical History:  Procedure Laterality Date  . ANKLE SURGERY Right   . CESAREAN SECTION  1984 and 1986   hodp pre term labor CS  . OVARIAN CYST SURGERY  1973  . SHOULDER ARTHROSCOPY WITH OPEN ROTATOR CUFF REPAIR AND DISTAL CLAVICLE ACROMINECTOMY Right 03/19/2017   Procedure: SHOULDER ARTHROSCOPY WITH OPEN ROTATOR CUFF REPAIR AND SUBACROMINAL DECOMPRESSION;  Surgeon: Thornton Park, MD;  Location: [facility] ORS;  Service: Orthopedics;  Laterality: Right;  . TUBAL LIGATION  1999  . WRIST GANGLION EXCISION      There were no vitals filed for this visit.      Subjective Assessment - 07/01/17 1721    Subjective Patient reports her shoulder is a little better from previous session and still painful with movement and trying to raise it   Pertinent History May 2017 lifted a case of water and felt a pain in right shoulder. She had PT prior to surgery without lasting results and then had MRI and underwent RTC repair 03/19/2017. She also has back pain.    Limitations Lifting;House hold activities;Other (comment)  Work   Patient Stated Goals return to full functional use right UE to return to prior level of function    Currently in Pain? Other (Comment)  discomfort, aching            Objective: AROM: right shoulder 0- 120 degrees, limited by pain, weakness Palpation: right shoulder: point tenderness anterior aspect over bicep tendon  Treatment: Manual therapy: 15 min. With patient supine with right UE supported;therapist performed superficial soft tissue mobilization to right shoulder, uppertrapezius, cervical spine musculature, paraspinal muscles and biceps muscle; Goal: decrease spasms, pain, improve soft tissue elasticity,improve pain free ROM  Therapeutic exercise: patient performed with assistance, demonstration and VC of therapist: AAROM supine lying right shoulder forward elevation, rotations multiple sets, reps Rhythmic stabilization right shoulder at 90 degrees elevation and neutral rotations in plane of scapula 3 x 10 reps each  Modalities:  Electrical stimulation: x 25min.:  High volt estim.clincial program for muscle spasms(2) electrodes applied to right shoulder anterior aspect upper arm and right upper trapezius muscle over spasm; intensity to tolerance with patient seated and right UE supported on pillow with coldpack applied to same; goal: pain, spasms; no adverse reactions noted   Patient response to treatment: improved ROM with less pain with repetition and guidance of therapist. Decreased pain anterior aspect of shoulder by 50% at end of session.           PT Education - 07/01/17 1730    Education provided Yes   Education Details educated in use of iontophoresis  with dexamethasone with verbal consent to add to [place], re assessed home exercises to continue   Person(s) Educated Patient   Methods Explanation;Demonstration;Verbal cues   Comprehension Verbalized understanding;Returned demonstration;Verbal cues required             PT Long Term Goals - 05/28/17 1810      PT LONG TERM GOAL #1   Title Patient will demonstrate improved functional use right UE  with decreased pain to mild with QuickDash score of 20% or less by 07/24/17   Baseline QuickDash 80%; 05/28/17 60%    Status Revised     PT LONG TERM GOAL #2   Title Patient will be independent with home program for pain control, flexibiltiy and strength by 07/24/2017 to allow self management following discharge from physical therapy    Baseline limited knowlege of appropriate pain control strategies, exercises and progression without moderate assistance   Status Revised               Plan - 07/01/17 1803    Clinical Impression Statement Patient demonstrates steady  and slow progress towards goals with improvement noted in ROM, strength during exercises with pain as primary limiting factor. Patient may benefit from addition of iontophoresis with dexamethasone to current POC to assist with pain control and to allow her to progress exercises.  Patient will benefit from continued physical therapy intervention to address limitations and achieve goals.    Rehab Potential Good   Clinical Impairments Affecting Rehab Potential (+)acute condition, motivated, prior level of function (-) chronic condition prior to surgery   PT Frequency 2x / week   PT Duration 8 weeks   PT Treatment/Interventions Patient/family education;Electrical Stimulation;Cryotherapy;Moist Heat;Neuromuscular re-education;Therapeutic exercise;Manual techniques   PT Next Visit Plan therapeutic exercise, pain control; electrical stimulation   PT Home Exercise Plan scapular retraction, AAROM right UE, shoulder, scapular control exercises; isomectrics at wall, light resistive exercises 1-2# weight supine forward flexion      Patient will benefit from skilled therapeutic intervention in order to improve the following deficits and impairments:  Decreased strength, Pain, Impaired perceived functional ability, Decreased activity tolerance, Decreased endurance, Increased muscle spasms, Impaired UE functional use, Decreased range of  motion  Visit Diagnosis: Acute pain of right shoulder  Other muscle spasm  Muscle weakness (generalized)     Problem List Patient Active Problem List   Diagnosis Date Noted  . Pre-operative examination 02/21/2017  . Low back pain 06/11/2016  . Osteopenia 09/30/2013  . Encounter for routine gynecological examination 01/18/2012  . Routine general medical examination at a health care facility 01/09/2012  . [place], [place] 02/06/2010  . DEGENERATIVE JOINT DISEASE, MILD 07/19/2009    Jomarie Longs PT 07/02/2017, 3:10 PM  [facility] [facility] [address], [place], [address] Phone: [phone]   Fax:  [phone]  Name: Bridget [name] MRN: [id number] Date of Birth: 1956-12-22

## 2017-07-03 ENCOUNTER — Encounter: Payer: Self-pay | Admitting: Physical Therapy

## 2017-07-03 ENCOUNTER — Ambulatory Visit: Payer: 59 | Admitting: Physical Therapy

## 2017-07-03 DIAGNOSIS — M25511 Pain in right shoulder: Secondary | ICD-10-CM

## 2017-07-03 DIAGNOSIS — M6281 Muscle weakness (generalized): Secondary | ICD-10-CM | POA: Diagnosis not present

## 2017-07-03 DIAGNOSIS — M62838 Other muscle spasm: Secondary | ICD-10-CM

## 2017-07-03 NOTE — Therapy (Signed)
[place] [facility] [address], [place], [address] Phone: [phone]   Fax:  [phone]  Physical Therapy Treatment  Patient Details  Name: Bridget [name] MRN: [id number] Date of Birth: 06-11-1956 Referring Provider: Thornton Park MD  Encounter Date: 07/03/2017      PT End of Session - 07/03/17 1526    Visit Number 24   Number of Visits 32   Date for PT Re-Evaluation 07/24/17   PT Start Time 2025   PT Stop Time 1510   PT Time Calculation (min) 38 min   Activity Tolerance Patient tolerated treatment well;Patient limited by pain   Behavior During Therapy [facility] for tasks assessed/performed      Past Medical History:  Diagnosis Date  . 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 Chronic back pain     Past Surgical History:  Procedure Laterality Date  . ANKLE SURGERY Right   . CESAREAN SECTION  1984 and 1986   hodp pre term labor CS  . OVARIAN CYST SURGERY  1973  . SHOULDER ARTHROSCOPY WITH OPEN ROTATOR CUFF REPAIR AND DISTAL CLAVICLE ACROMINECTOMY Right 03/19/2017   Procedure: SHOULDER ARTHROSCOPY WITH OPEN ROTATOR CUFF REPAIR AND SUBACROMINAL DECOMPRESSION;  Surgeon: Thornton Park, MD;  Location: [facility] ORS;  Service: Orthopedics;  Laterality: Right;  . TUBAL LIGATION  1999  . WRIST GANGLION EXCISION      There were no vitals filed for this visit.      Subjective Assessment - 07/03/17 1520    Subjective right shoulder still painful with raising arm above shoulder level. Patient agrees to iontophoresis treatment be added to plan.    Pertinent History May 2017 lifted a case of water and felt a pain in right shoulder. She had PT prior to surgery without lasting results and then had MRI and underwent RTC repair 03/19/2017. She also has back pain.    Limitations Lifting;House hold activities;Other (comment)  Work   Patient Stated Goals return to full functional use right UE to return to prior level of  function   Currently in Pain? Other (Comment)  constant ache in front of shoulder and latera shoulder right      Objective: Right shoulder AROM: standing 0-125 degrees forward elevation with pain beginning at 110 degrees and above pre treatment; improved to 130 degrees post treatment  Treatment:  Modalities: iontophoresis with Dexamethasone 4mg /ml @ 58ma*min; therapist applied (2) large electrodes to right shoulder; anterior and posterior aspect of shoulder with patient seated with UE supported on pillow: goals: pain, improve ROM, function; 15 min. to apply (~20 min. Total following application) Mild redness under medicated electrodes, no adverse reactions noted following treatment  Patient response to treatment: able to raise arm above 90 without as much pain as prior to treatment. Verbalized good understanding of exercises to continue for home         PT Education - 07/03/17 1521    Education provided Yes   Education Details continue with pulley within pain free range right shoulder instructed patient in iontophoresis precautions/reactions    Person(s) Educated Patient   Methods Explanation;Demonstration;Verbal cues   Comprehension Verbalized understanding;Returned demonstration;Verbal cues required             PT Long Term Goals - 05/28/17 1810      PT LONG TERM GOAL #1   Title Patient will demonstrate improved functional use right UE with decreased pain to mild with QuickDash score of 20% or less  by 07/24/17   Baseline QuickDash 80%; 05/28/17 60%    Status Revised     PT LONG TERM GOAL #2   Title Patient will be independent with home program for pain control, flexibiltiy and strength by 07/24/2017 to allow self management following discharge from physical therapy    Baseline limited knowlege of appropriate pain control strategies, exercises and progression without moderate assistance   Status Revised               Plan - 07/03/17 1527    Clinical Impression  Statement no adverse reaction to ionto.improved abiltiy to raise arm above shoulder level with less difficulty. Focus for this session was on pain control and patient is progressing well with ROM and strength. She will benefit from additional physical therapy intervention to address limitations in order to achieve goals.    Rehab Potential Good   Clinical Impairments Affecting Rehab Potential (+)acute condition, motivated, prior level of function (-) chronic condition prior to surgery   PT Frequency 2x / week   PT Duration 8 weeks   PT Treatment/Interventions Patient/family education;Electrical Stimulation;Cryotherapy;Moist Heat;Neuromuscular re-education;Therapeutic exercise;Manual techniques   PT Next Visit Plan therapeutic exercise, pain control; electrical stimulation   PT Home Exercise Plan scapular retraction, AAROM right UE, shoulder, scapular control exercises; isomectrics at wall, light resistive exercises 1-2# weight supine forward flexion      Patient will benefit from skilled therapeutic intervention in order to improve the following deficits and impairments:  Decreased strength, Pain, Impaired perceived functional ability, Decreased activity tolerance, Decreased endurance, Increased muscle spasms, Impaired UE functional use, Decreased range of motion  Visit Diagnosis: Acute pain of right shoulder  Other muscle spasm     Problem List Patient Active Problem List   Diagnosis Date Noted  . Pre-operative examination 02/21/2017  . Low back pain 06/11/2016  . Osteopenia 09/30/2013  . Encounter for routine gynecological examination 01/18/2012  . Routine general medical examination at a health care facility 01/09/2012  . [place], [place] 02/06/2010  . DEGENERATIVE JOINT DISEASE, MILD 07/19/2009    Jomarie Longs PT 07/03/2017, 3:28 PM  [place] [facility] [address] [place], [address] Phone: [phone]    Fax:  [phone]  Name: Bridget [name] MRN: [id number] Date of Birth: 1956-02-10

## 2017-07-08 ENCOUNTER — Ambulatory Visit: Payer: 59 | Admitting: Physical Therapy

## 2017-07-08 ENCOUNTER — Encounter: Payer: Self-pay | Admitting: Physical Therapy

## 2017-07-08 DIAGNOSIS — M62838 Other muscle spasm: Secondary | ICD-10-CM

## 2017-07-08 DIAGNOSIS — M6281 Muscle weakness (generalized): Secondary | ICD-10-CM

## 2017-07-08 DIAGNOSIS — M25511 Pain in right shoulder: Secondary | ICD-10-CM

## 2017-07-08 NOTE — Therapy (Signed)
[place] [address], [place], [address] Phone: [phone]   Fax:  [phone]  Physical Therapy Treatment  Patient Details  Name: Bridget [name] MRN: [id number] Date of Birth: [birth year] Referring Provider: Thornton Park MD  Encounter Date: 07/08/2017      PT End of Session - 07/08/17 1925    Visit Number 25   Number of Visits 32   Date for PT Re-Evaluation 07/24/17   PT Start Time 2836   PT Stop Time 1835   PT Time Calculation (min) 64 min   Activity Tolerance Patient tolerated treatment well;Patient limited by pain   Behavior During Therapy [facility] for tasks assessed/performed      Past Medical History:  Diagnosis Date  . 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 Chronic back pain     Past Surgical History:  Procedure Laterality Date  . ANKLE SURGERY Right   . CESAREAN SECTION  1984 and 1986   hodp pre term labor CS  . OVARIAN CYST SURGERY  1973  . SHOULDER ARTHROSCOPY WITH OPEN ROTATOR CUFF REPAIR AND DISTAL CLAVICLE ACROMINECTOMY Right 03/19/2017   Procedure: SHOULDER ARTHROSCOPY WITH OPEN ROTATOR CUFF REPAIR AND SUBACROMINAL DECOMPRESSION;  Surgeon: Thornton Park, MD;  Location: [facility] ORS;  Service: Orthopedics;  Laterality: Right;  . TUBAL LIGATION  1999  . WRIST GANGLION EXCISION      There were no vitals filed for this visit.      Subjective Assessment - 07/08/17 1740    Subjective Pain right shoulder even below shoulder level today. Patient reports she is using right UE for personal care and did into cabinet to get a dish out.    Pertinent History May 2017 lifted a case of water and felt a pain in right shoulder. She had PT prior to surgery without lasting results and then had MRI and underwent RTC repair 03/19/2017. She also has back pain.    Limitations Lifting;House hold activities;Other (comment)  Work   Patient Stated Goals return to full functional use right UE to return to  prior level of function   Currently in Pain? Other (Comment)  constant in rigth shoulder even at rest aching front and lateral right shoulder        Objective: Right shoulder AROM: standing 0-125 degrees forward elevation with pain beginning att < 90 degrees and above pre treatment  Treatment:  Manual therapy: 25 min. With patient supine  with right UE supported;therapist performed superficial soft tissue mobilization to right shoulder,uppertrapezius, cervical spine musculature, paraspinal muscles and biceps muscle; Goal: decrease spasms, pain, improve soft tissue elasticity,improve pain free ROM  Therapeutic exercise: patient performed with assistance, demonstration and VC of therapist: AAROM supine lying right shoulder forward elevation, rotations multiple sets, reps Scapular retraction supine x 10 reps with VC  Modalities: iontophoresis with Dexamethasone 4mg /ml @ 46ma*min; therapist applied (2) large electrodes to right shoulder; anterior and posterior aspect of shoulder with patient seated with UE supported on pillow: goals: pain, improve ROM, function; 10 min. to apply (~20 min. Total following application) Mild redness under medicated electrodes, no adverse reactions noted following treatment  Patient response to treatment: No adverse reaction to treatment, patient continued with pain in right shoulder with raising arm above shoulder level, decreased soreness reported at end of session.          PT Education - 07/08/17 1528    Education provided Yes   Education Details re assessed home  program   Person(s) Educated Patient   Methods Explanation   Comprehension Verbalized understanding             PT Long Term Goals - 05/28/17 1810      PT LONG TERM GOAL #1   Title Patient will demonstrate improved functional use right UE with decreased pain to mild with QuickDash score of 20% or less by 07/24/17   Baseline QuickDash 80%; 05/28/17 60%    Status Revised     PT  LONG TERM GOAL #2   Title Patient will be independent with home program for pain control, flexibiltiy and strength by 07/24/2017 to allow self management following discharge from physical therapy    Baseline limited knowlege of appropriate pain control strategies, exercises and progression without moderate assistance   Status Revised               Plan - 07/08/17 1926    Clinical Impression Statement Patient demonstrates some carry over with iontophoresis treatment. Patient needs to be more aware of using right UE for reaching and staying in pain free ranges. Patient responded well to [facility] today. She continues with    Rehab Potential Good   Clinical Impairments Affecting Rehab Potential (+)acute condition, motivated, prior level of function (-) chronic condition prior to surgery   PT Frequency 2x / week   PT Duration 8 weeks   PT Treatment/Interventions Patient/family education;Electrical Stimulation;Cryotherapy;Moist Heat;Neuromuscular re-education;Therapeutic exercise;Manual techniques   PT Next Visit Plan therapeutic exercise, pain control; electrical stimulation   PT Home Exercise Plan scapular retraction, AAROM right UE, shoulder, scapular control exercises; isomectrics at wall, light resistive exercises 1-2# weight supine forward flexion      Patient will benefit from skilled therapeutic intervention in order to improve the following deficits and impairments:  Decreased strength, Pain, Impaired perceived functional ability, Decreased activity tolerance, Decreased endurance, Increased muscle spasms, Impaired UE functional use, Decreased range of motion  Visit Diagnosis: Acute pain of right shoulder  Other muscle spasm  Muscle weakness (generalized)     Problem List Patient Active Problem List   Diagnosis Date Noted  . Pre-operative examination 02/21/2017  . Low back pain 06/11/2016  . Osteopenia 09/30/2013  . Encounter for routine gynecological examination 01/18/2012  .  Routine general medical examination at a health care facility 01/09/2012  . [place], [place] 02/06/2010  . DEGENERATIVE JOINT DISEASE, MILD 07/19/2009    Jomarie Longs PT 07/09/2017, 10:31 PM  [place] [address], [place], [address] Phone: [phone]   Fax:  [phone]  Name: Bridget [name] MRN: [id number] Date of Birth: 07-Apr-1956

## 2017-07-10 ENCOUNTER — Ambulatory Visit: Payer: 59 | Admitting: Physical Therapy

## 2017-07-10 ENCOUNTER — Encounter: Payer: Self-pay | Admitting: Physical Therapy

## 2017-07-10 DIAGNOSIS — M6281 Muscle weakness (generalized): Secondary | ICD-10-CM

## 2017-07-10 DIAGNOSIS — M62838 Other muscle spasm: Secondary | ICD-10-CM | POA: Diagnosis not present

## 2017-07-10 DIAGNOSIS — M25511 Pain in right shoulder: Secondary | ICD-10-CM

## 2017-07-10 NOTE — Therapy (Signed)
[place] [facility] [address], [place], [address] Phone: [phone]   Fax:  [phone]  Physical Therapy Treatment  Patient Details  Name: Bridget [name] MRN: [id number] Date of Birth: 30-Sep-1956 Referring Provider: Thornton Park MD  Encounter Date: 07/10/2017      PT End of Session - 07/10/17 1337    Visit Number 26   Number of Visits 32   Date for PT Re-Evaluation 07/24/17   PT Start Time 9678   PT Stop Time 1343   PT Time Calculation (min) 47 min   Activity Tolerance Patient tolerated treatment well;Patient limited by pain   Behavior During Therapy [facility] for tasks assessed/performed      Past Medical History:  Diagnosis Date  . 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 Chronic back pain     Past Surgical History:  Procedure Laterality Date  . ANKLE SURGERY Right   . CESAREAN SECTION  1984 and 1986   hodp pre term labor CS  . OVARIAN CYST SURGERY  1973  . SHOULDER ARTHROSCOPY WITH OPEN ROTATOR CUFF REPAIR AND DISTAL CLAVICLE ACROMINECTOMY Right 03/19/2017   Procedure: SHOULDER ARTHROSCOPY WITH OPEN ROTATOR CUFF REPAIR AND SUBACROMINAL DECOMPRESSION;  Surgeon: Thornton Park, MD;  Location: [facility] ORS;  Service: Orthopedics;  Laterality: Right;  . TUBAL LIGATION  1999  . WRIST GANGLION EXCISION      There were no vitals filed for this visit.      Subjective Assessment - 07/10/17 1258    Subjective Patient reports she did not see much improvement since previous session and she is not using arm much as instructed.    Pertinent History May 2017 lifted a case of water and felt a pain in right shoulder. She had PT prior to surgery without lasting results and then had MRI and underwent RTC repair 03/19/2017. She also has back pain.    Limitations Lifting;House hold activities;Other (comment)  Work   Patient Stated Goals return to full functional use right UE to return to prior level of function   Currently in Pain? Other (Comment)  constant aching and unable to use right arm without pain        Objective: Right shoulder AROM: standing 0-125 degrees forward elevation with pain beginning at < 90 degrees and above pre treatment Palpation: tenderness along right UE along biceps, lateral aspect of shoulder   Treatment:  Modalities: Ultrasound 1MHz pulsed 20% @ 1.2w/cm2 x 15 min. To right shoulder anterior, posterior, lateral shoulder region with patient seated in chair with right UE supported on pillow: goal: pain, improve ROM iontophoresis with Dexamethasone 4mg /ml @ 22ma*min; therapist applied (2) large electrodes to right shoulder; anterior and lateral aspect of shoulder with patient seated with UE supported on pillow: goals: pain, improve ROM, function; 10 min. to apply (~20 min. Total following application) Mild redness under medicated electrodes, no adverse reactions noted following treatment  Patient response to treatment: Patient was abel to raise right UE with greater ease and less pain following [place]. Patient able to stay within boundaries without pain at end of session (below 90 degrees, no crossing midline, no reaching back)No adverse reaction to treatment         PT Education - 07/10/17 1337    Education provided Yes   Education Details reviewed precautions for shoulder to avoid aggravating positons   Person(s) Educated Patient   Methods Explanation;Demonstration   Comprehension Verbalized understanding  PT Long Term Goals - 05/28/17 1810      PT LONG TERM GOAL #1   Title Patient will demonstrate improved functional use right UE with decreased pain to mild with QuickDash score of 20% or less by 07/24/17   Baseline QuickDash 80%; 05/28/17 60%    Status Revised     PT LONG TERM GOAL #2   Title Patient will be independent with home program for pain control, flexibiltiy and strength by 07/24/2017 to allow self management following discharge from physical  therapy    Baseline limited knowlege of appropriate pain control strategies, exercises and progression without moderate assistance   Status Revised               Plan - 07/10/17 1338    Clinical Impression Statement Patient responded well to [place] with improved abiltiy to raise right arm above shoulder level with decreased pain. She is progressing slowly towards goals due to prolonged period of pain which is aggravated by trying to use arm during personal care activities.    Rehab Potential Good   Clinical Impairments Affecting Rehab Potential (+)acute condition, motivated, prior level of function (-) chronic condition prior to surgery   PT Frequency 2x / week   PT Duration 8 weeks   PT Treatment/Interventions Patient/family education;Electrical Stimulation;Cryotherapy;Moist Heat;Neuromuscular re-education;Therapeutic exercise;Manual techniques   PT Next Visit Plan therapeutic exercise, pain control; electrical stimulation   PT Home Exercise Plan scapular retraction, AAROM right UE, shoulder, scapular control exercises; isomectrics at wall, light resistive exercises 1-2# weight supine forward flexion      Patient will benefit from skilled therapeutic intervention in order to improve the following deficits and impairments:  Decreased strength, Pain, Impaired perceived functional ability, Decreased activity tolerance, Decreased endurance, Increased muscle spasms, Impaired UE functional use, Decreased range of motion  Visit Diagnosis: Acute pain of right shoulder  Other muscle spasm  Muscle weakness (generalized)     Problem List Patient Active Problem List   Diagnosis Date Noted  . Pre-operative examination 02/21/2017  . Low back pain 06/11/2016  . Osteopenia 09/30/2013  . Encounter for routine gynecological examination 01/18/2012  . Routine general medical examination at a health care facility 01/09/2012  . [place], [place] 02/06/2010  . DEGENERATIVE JOINT DISEASE, MILD  07/19/2009    Jomarie Longs PT 07/11/2017, 9:33 AM  [place] [facility] [address] [place], [address] Phone: [phone]   Fax:  [phone]  Name: BENEDICTA SULTAN MRN: [id number] Date of Birth: [birth year]

## 2017-07-15 ENCOUNTER — Ambulatory Visit: Payer: 59 | Admitting: Physical Therapy

## 2017-07-15 ENCOUNTER — Encounter: Payer: Self-pay | Admitting: Physical Therapy

## 2017-07-15 DIAGNOSIS — M62838 Other muscle spasm: Secondary | ICD-10-CM

## 2017-07-15 DIAGNOSIS — M25511 Pain in right shoulder: Secondary | ICD-10-CM | POA: Diagnosis not present

## 2017-07-15 DIAGNOSIS — M6281 Muscle weakness (generalized): Secondary | ICD-10-CM | POA: Diagnosis not present

## 2017-07-16 NOTE — Therapy (Signed)
[place] [facility] [address] [place], [address] Phone: [phone]   Fax:  [phone]  Physical Therapy Treatment  Patient Details  Name: Bridget [name] MRN: [id number] Date of Birth: Sep 23, 1956 Referring Provider: Thornton Park MD  Encounter Date: 07/15/2017      PT End of Session - 07/15/17 1833    Visit Number 27   Number of Visits 32   Date for PT Re-Evaluation 07/24/17   PT Start Time 8242   PT Stop Time 1835   PT Time Calculation (min) 65 min   Activity Tolerance Patient tolerated treatment well;Patient limited by pain   Behavior During Therapy [facility] for tasks assessed/performed      Past Medical History:  Diagnosis Date  . 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 Chronic back pain     Past Surgical History:  Procedure Laterality Date  . ANKLE SURGERY Right   . CESAREAN SECTION  1984 and 1986   hodp pre term labor CS  . OVARIAN CYST SURGERY  1973  . SHOULDER ARTHROSCOPY WITH OPEN ROTATOR CUFF REPAIR AND DISTAL CLAVICLE ACROMINECTOMY Right 03/19/2017   Procedure: SHOULDER ARTHROSCOPY WITH OPEN ROTATOR CUFF REPAIR AND SUBACROMINAL DECOMPRESSION;  Surgeon: Thornton Park, MD;  Location: [facility] ORS;  Service: Orthopedics;  Laterality: Right;  . TUBAL LIGATION  1999  . WRIST GANGLION EXCISION      There were no vitals filed for this visit.      Subjective Assessment - 07/15/17 1733    Subjective Improved from previous session   Pertinent History May 2017 lifted a case of water and felt a pain in right shoulder. She had PT prior to surgery without lasting results and then had MRI and underwent RTC repair 03/19/2017. She also has back pain.    Limitations Lifting;House hold activities;Other (comment)  Work   Patient Stated Goals return to full functional use right UE to return to prior level of function   Currently in Pain? No/denies     Objective: Palpation: tenderness along right UE along  biceps, lateral aspect of shoulder, pectoral muscles  Treatment:  Manual therapy:25 min.: goal: pain, improve soft tissue elasticity, ROM right shoulder STM performed to right shoulder, cervical spine paravertebral muscles, upper trapezius and mid thoracic spine musculature with patient supine and side lying; superficial and compression techniques  Modalities: Ultrasound 1MHz pulsed 20% @ 1.2w/cm2 x 8 min. To right shoulder anterior,lateral shoulder region with patient seated in chair with right UE supported on pillow: goal: pain, improve ROM iontophoresis with Dexamethasone 4mg /ml @ 46ma*min; therapist applied (2) large electrodes to right shoulder; anterior and lateral aspect of shoulder with patient seated with UE supported on pillow: goals: pain, improve ROM, function; 50min. to apply (~25 min. Total following application) Mild redness under medicated electrodes, no adverse reactions noted following treatment  Patient response to treatment: Improved soft tissue elasticity with decreased tenderness following STM, modalities. Unable to perform exercises due to increased pain with right shoulder elevation.          PT Education - 07/15/17 1800    Education provided Yes   Education Details re assessed precautions, exercises to continue for ROM, pain control   Person(s) Educated Patient   Methods Explanation   Comprehension Verbalized understanding             PT Long Term Goals - 05/28/17 1810      PT LONG TERM GOAL #1   Title Patient  will demonstrate improved functional use right UE with decreased pain to mild with QuickDash score of 20% or less by 07/24/17   Baseline QuickDash 80%; 05/28/17 60%    Status Revised     PT LONG TERM GOAL #2   Title Patient will be independent with home program for pain control, flexibiltiy and strength by 07/24/2017 to allow self management following discharge from physical therapy    Baseline limited knowlege of appropriate pain control  strategies, exercises and progression without moderate assistance   Status Revised               Plan - 07/15/17 1840    Clinical Impression Statement Patient is responding well to [place] with improvement in pain and ROM with less difficulty and aimproving functional use right UE. She is progressing steadily with current treatment and should continue to improve with additional physical therapy intervention.    Rehab Potential Good   Clinical Impairments Affecting Rehab Potential (+)acute condition, motivated, prior level of function (-) chronic condition prior to surgery   PT Frequency 2x / week   PT Duration 8 weeks   PT Treatment/Interventions Patient/family education;Electrical Stimulation;Cryotherapy;Moist Heat;Neuromuscular re-education;Therapeutic exercise;Manual techniques   PT Next Visit Plan therapeutic exercise, pain control; electrical stimulation   PT Home Exercise Plan scapular retraction, AAROM right UE, shoulder, scapular control exercises; isomectrics at wall, light resistive exercises 1-2# weight supine forward flexion      Patient will benefit from skilled therapeutic intervention in order to improve the following deficits and impairments:  Decreased strength, Pain, Impaired perceived functional ability, Decreased activity tolerance, Decreased endurance, Increased muscle spasms, Impaired UE functional use, Decreased range of motion  Visit Diagnosis: Acute pain of right shoulder  Other muscle spasm  Muscle weakness (generalized)     Problem List Patient Active Problem List   Diagnosis Date Noted  . Pre-operative examination 02/21/2017  . Low back pain 06/11/2016  . Osteopenia 09/30/2013  . Encounter for routine gynecological examination 01/18/2012  . Routine general medical examination at a health care facility 01/09/2012  . [place], [place] 02/06/2010  . DEGENERATIVE JOINT DISEASE, MILD 07/19/2009    Jomarie Longs PT 07/16/2017, 11:19 PM  [place] [facility] [address] [place], [address] Phone: [phone]   Fax:  [phone]  Name: Bridget [name] MRN: [id number] Date of Birth: [birth year]

## 2017-07-17 ENCOUNTER — Encounter: Payer: Self-pay | Admitting: Physical Therapy

## 2017-07-17 ENCOUNTER — Ambulatory Visit: Payer: 59 | Admitting: Physical Therapy

## 2017-07-17 DIAGNOSIS — M62838 Other muscle spasm: Secondary | ICD-10-CM | POA: Diagnosis not present

## 2017-07-17 DIAGNOSIS — M6281 Muscle weakness (generalized): Secondary | ICD-10-CM

## 2017-07-17 DIAGNOSIS — M25511 Pain in right shoulder: Secondary | ICD-10-CM

## 2017-07-18 NOTE — Therapy (Signed)
[place] [facility] [address] [place], [address] Phone: [phone]   Fax:  [phone]  Physical Therapy Treatment  Patient Details  Name: Bridget [name] MRN: [id number] Date of Birth: 10/03/1956 Referring Provider: Thornton Park MD  Encounter Date: 07/17/2017      PT End of Session - 07/17/17 1357    Visit Number 28   Number of Visits 32   Date for PT Re-Evaluation 07/24/17   PT Start Time 1309   PT Stop Time 1359   PT Time Calculation (min) 50 min   Activity Tolerance Patient tolerated treatment well;Patient limited by pain   Behavior During Therapy [facility] for tasks assessed/performed      Past Medical History:  Diagnosis Date  . 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 Chronic back pain     Past Surgical History:  Procedure Laterality Date  . ANKLE SURGERY Right   . CESAREAN SECTION  1984 and 1986   hodp pre term labor CS  . OVARIAN CYST SURGERY  1973  . SHOULDER ARTHROSCOPY WITH OPEN ROTATOR CUFF REPAIR AND DISTAL CLAVICLE ACROMINECTOMY Right 03/19/2017   Procedure: SHOULDER ARTHROSCOPY WITH OPEN ROTATOR CUFF REPAIR AND SUBACROMINAL DECOMPRESSION;  Surgeon: Thornton Park, MD;  Location: [facility] ORS;  Service: Orthopedics;  Laterality: Right;  . TUBAL LIGATION  1999  . WRIST GANGLION EXCISION      There were no vitals filed for this visit.      Subjective Assessment - 07/17/17 1354    Subjective increased pain in lateral aspect of shoulder into biceps, deltoid region   Pertinent History May 2017 lifted a case of water and felt a pain in right shoulder. She had PT prior to surgery without lasting results and then had MRI and underwent RTC repair 03/19/2017. She also has back pain.    Limitations Lifting;House hold activities;Other (comment)  Work   Patient Stated Goals return to full functional use right UE to return to prior level of function   Currently in Pain? Yes   Pain Score 2    Pain  Location Shoulder   Pain Orientation Right   Pain Descriptors / Indicators Aching   Pain Type Surgical pain  03/19/2017   Pain Onset More than a month ago   Pain Frequency Intermittent      Objective: Palpation: tenderness along right UE along biceps, lateral aspect of shoulder  Treatment:   Modalities: Ultrasound/high volt estim. Combination: 15 min.: 1MHz pulsed 50% @ 1.1w/cm2 right shoulder anterior,lateral shoulder region with patient seated in chair with right UE supported on pillow: goal: pain, improve ROM iontophoresis with Dexamethasone 4mg /ml @ 22ma*min; therapist applied (2) large electrodes to right shoulder; anterior and lateral aspect of shoulder with patient seated with UE supported on pillow: goals: pain, improve ROM, function; 61min. to apply (~20 min. Total following application) Mild redness under medicated electrodes, no adverse reactions noted following treatment  Patient response to treatment: Improved soft tissue elasticity with decreased tenderness with elevation of right UE following modalities. Unable to perform exercises due to increased pain with right shoulder exercises         PT Education - 07/17/17 1356    Education provided Yes   Education Details re enforced precautions, use of heat/ice to control symptoms   Person(s) Educated Patient   Methods Explanation   Comprehension Verbalized understanding             PT Long Term Goals -  05/28/17 1810      PT LONG TERM GOAL #1   Title Patient will demonstrate improved functional use right UE with decreased pain to mild with QuickDash score of 20% or less by 07/24/17   Baseline QuickDash 80%; 05/28/17 60%    Status Revised     PT LONG TERM GOAL #2   Title Patient will be independent with home program for pain control, flexibiltiy and strength by 07/24/2017 to allow self management following discharge from physical therapy    Baseline limited knowlege of appropriate pain control strategies,  exercises and progression without moderate assistance   Status Revised               Plan - 07/17/17 1350    Clinical Impression Statement Patient is responding well to US/estim. and iontophoresis slowly and steadily. She should continue to benefit with decreasing symptoms with continued physical therapy intervention.    Rehab Potential Good   Clinical Impairments Affecting Rehab Potential (+)acute condition, motivated, prior level of function (-) chronic condition prior to surgery   PT Frequency 2x / week   PT Duration 8 weeks   PT Treatment/Interventions Patient/family education;Electrical Stimulation;Cryotherapy;Moist Heat;Neuromuscular re-education;Therapeutic exercise;Manual techniques   PT Next Visit Plan therapeutic exercise, pain control; electrical stimulation   PT Home Exercise Plan scapular retraction, AAROM right UE, shoulder, scapular control exercises; isomectrics at wall, light resistive exercises 1-2# weight supine forward flexion      Patient will benefit from skilled therapeutic intervention in order to improve the following deficits and impairments:  Decreased strength, Pain, Impaired perceived functional ability, Decreased activity tolerance, Decreased endurance, Increased muscle spasms, Impaired UE functional use, Decreased range of motion  Visit Diagnosis: Acute pain of right shoulder  Other muscle spasm  Muscle weakness (generalized)     Problem List Patient Active Problem List   Diagnosis Date Noted  . Pre-operative examination 02/21/2017  . Low back pain 06/11/2016  . Osteopenia 09/30/2013  . Encounter for routine gynecological examination 01/18/2012  . Routine general medical examination at a health care facility 01/09/2012  . [place], [place] 02/06/2010  . DEGENERATIVE JOINT DISEASE, MILD 07/19/2009    Aldona Lento 07/18/2017, 4:33 PM  [place] [facility] [address] [place], [address] Phone: [phone]   Fax:  [phone]  Name: CHENAE BRAGER MRN: [id number] Date of Birth: 1956/09/10

## 2017-07-22 ENCOUNTER — Ambulatory Visit: Payer: 59 | Admitting: Physical Therapy

## 2017-07-22 ENCOUNTER — Encounter: Payer: Self-pay | Admitting: Physical Therapy

## 2017-07-22 DIAGNOSIS — M6281 Muscle weakness (generalized): Secondary | ICD-10-CM

## 2017-07-22 DIAGNOSIS — M25511 Pain in right shoulder: Secondary | ICD-10-CM

## 2017-07-22 DIAGNOSIS — M62838 Other muscle spasm: Secondary | ICD-10-CM | POA: Diagnosis not present

## 2017-07-23 NOTE — Therapy (Signed)
[place] [facility] [address] [place], [address] Phone: [phone]   Fax:  [phone]  Physical Therapy Treatment  Patient Details  Name: Bridget [name] MRN: [id number] Date of Birth: 1956-06-30 Referring Provider: Thornton Park MD  Encounter Date: 07/22/2017      PT End of Session - 07/22/17 1739    Visit Number 29   Number of Visits 32   Date for PT Re-Evaluation 07/24/17   PT Start Time 1732   PT Stop Time 1830   PT Time Calculation (min) 58 min   Activity Tolerance Patient tolerated treatment well;Patient limited by pain   Behavior During Therapy [facility] for tasks assessed/performed      Past Medical History:  Diagnosis Date  . 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 Chronic back pain     Past Surgical History:  Procedure Laterality Date  . ANKLE SURGERY Right   . CESAREAN SECTION  1984 and 1986   hodp pre term labor CS  . OVARIAN CYST SURGERY  1973  . SHOULDER ARTHROSCOPY WITH OPEN ROTATOR CUFF REPAIR AND DISTAL CLAVICLE ACROMINECTOMY Right 03/19/2017   Procedure: SHOULDER ARTHROSCOPY WITH OPEN ROTATOR CUFF REPAIR AND SUBACROMINAL DECOMPRESSION;  Surgeon: Thornton Park, MD;  Location: [facility] ORS;  Service: Orthopedics;  Laterality: Right;  . TUBAL LIGATION  1999  . WRIST GANGLION EXCISION      There were no vitals filed for this visit.      Subjective Assessment - 07/22/17 1734    Subjective Patient reports she is still having pain in her shoulder and is tender in posterior aspect near lats.    Pertinent History May 2017 lifted a case of water and felt a pain in right shoulder. She had PT prior to surgery without lasting results and then had MRI and underwent RTC repair 03/19/2017. She also has back pain.    Limitations Lifting;House hold activities;Other (comment)  Work   Patient Stated Goals return to full functional use right UE to return to prior level of function   Currently in Pain?  Yes   Pain Score 3    Pain Location Shoulder   Pain Orientation Right   Pain Descriptors / Indicators Aching   Pain Type Surgical pain  03/19/2017   Pain Onset More than a month ago   Pain Frequency Intermittent       Objective: Palpation: tenderness along right UE along biceps, lateral aspect of shoulder and posterior aspect of shoulder AROM: increased pain right shoulder at 90 degrees elevation (pain is along anterior aspect into upper arm/biceps region); able to continue elevation to 125 degrees  Treatment:   Modalities: Ultrasound/high volt estim. Combination: 15 min.: 1MHz pulsed 50% @ 1.1w/cm2; high volt intensity to tolerance (50-90 volts depending on area treated) right shoulder anterior,lateral shoulder region with patient seated in chair with right UE supported on pillow: goal: pain, improve ROM iontophoresis with Dexamethasone 4mg /ml @ 95ma*min; therapist applied (2) large electrodes to right shoulder; anterior and lateral aspect of shoulder with patient seated with UE supported on pillow: goals: pain, improve ROM, function; 56min. to apply (~31min. Total following application) Mild redness under medicated electrodes, no adverse reactions noted following treatment  Patient response to treatment: Patient demonstrated significant decrease in tenderness right shoulder and was able to elevate right UE above shoulder level with greater ease and less pain <3/10 following [place] combination treatment. No adverse reaction noted following iontophoresis.  PT Education - 07/22/17 1830    Education provided Yes   Education Details re assessed self management of symptoms   Person(s) Educated Patient   Methods Explanation   Comprehension Verbalized understanding             PT Long Term Goals - 05/28/17 1810      PT LONG TERM GOAL #1   Title Patient will demonstrate improved functional use right UE with decreased pain to mild with QuickDash score of 20% or less by  07/24/17   Baseline QuickDash 80%; 05/28/17 60%    Status Revised     PT LONG TERM GOAL #2   Title Patient will be independent with home program for pain control, flexibiltiy and strength by 07/24/2017 to allow self management following discharge from physical therapy    Baseline limited knowlege of appropriate pain control strategies, exercises and progression without moderate assistance   Status Revised               Plan - 07/22/17 1830    Clinical Impression Statement Patient is responding well to US/estim. and iontophoresis with slow and steady progress. She continues with constant pain and aching in front of shoulder into upper arm and is concerned about this and will speak with surgeon this week regarding continued aching. she will benefit from continued physical therapy intervention in order to progress exercises as able for return to prior level of function.    Rehab Potential Good   Clinical Impairments Affecting Rehab Potential (+)acute condition, motivated, prior level of function (-) chronic condition prior to surgery   PT Frequency 2x / week   PT Duration 8 weeks   PT Treatment/Interventions Patient/family education;Electrical Stimulation;Cryotherapy;Moist Heat;Neuromuscular re-education;Therapeutic exercise;Manual techniques   PT Next Visit Plan therapeutic exercise, pain control; electrical stimulation   PT Home Exercise Plan scapular retraction, AAROM right UE, shoulder, scapular control exercises; isomectrics at wall, light resistive exercises 1-2# weight supine forward flexion      Patient will benefit from skilled therapeutic intervention in order to improve the following deficits and impairments:  Decreased strength, Pain, Impaired perceived functional ability, Decreased activity tolerance, Decreased endurance, Increased muscle spasms, Impaired UE functional use, Decreased range of motion  Visit Diagnosis: Acute pain of right shoulder  Other muscle spasm  Muscle  weakness (generalized)     Problem List Patient Active Problem List   Diagnosis Date Noted  . Pre-operative examination 02/21/2017  . Low back pain 06/11/2016  . Osteopenia 09/30/2013  . Encounter for routine gynecological examination 01/18/2012  . Routine general medical examination at a health care facility 01/09/2012  . [place], [place] 02/06/2010  . DEGENERATIVE JOINT DISEASE, MILD 07/19/2009    Jomarie Longs PT 07/23/2017, 4:12 PM  [place] [facility] [address], [place], [address] Phone: [phone]   Fax:  [phone]  Name: Bridget [name] MRN: [id number] Date of Birth: 1956-06-19

## 2017-07-24 ENCOUNTER — Ambulatory Visit: Payer: 59 | Attending: Orthopedic Surgery | Admitting: Physical Therapy

## 2017-07-24 ENCOUNTER — Encounter: Payer: Self-pay | Admitting: Physical Therapy

## 2017-07-24 DIAGNOSIS — M6281 Muscle weakness (generalized): Secondary | ICD-10-CM | POA: Insufficient documentation

## 2017-07-24 DIAGNOSIS — M62838 Other muscle spasm: Secondary | ICD-10-CM | POA: Diagnosis not present

## 2017-07-24 DIAGNOSIS — M25511 Pain in right shoulder: Secondary | ICD-10-CM | POA: Diagnosis not present

## 2017-07-24 DIAGNOSIS — M7551 Bursitis of right shoulder: Secondary | ICD-10-CM | POA: Diagnosis not present

## 2017-07-24 DIAGNOSIS — M7521 Bicipital tendinitis, right shoulder: Secondary | ICD-10-CM | POA: Diagnosis not present

## 2017-07-24 DIAGNOSIS — M75111 Incomplete rotator cuff tear or rupture of right shoulder, not specified as traumatic: Secondary | ICD-10-CM | POA: Diagnosis not present

## 2017-07-25 NOTE — Therapy (Signed)
[place] [facility] [address] [place], [address] Phone: [phone]   Fax:  [phone]  Physical Therapy Treatment  Patient Details  Name: Bridget [name] MRN: [id number] Date of Birth: 05/26/1956 Referring Provider: Thornton Park MD  Encounter Date: 07/24/2017      PT End of Session - 07/24/17 1538    Visit Number 30   Number of Visits 32   Date for PT Re-Evaluation 07/24/17   PT Start Time 4166   PT Stop Time 1520   PT Time Calculation (min) 45 min   Activity Tolerance Patient tolerated treatment well;Patient limited by pain   Behavior During Therapy [facility] for tasks assessed/performed      Past Medical History:  Diagnosis Date  . 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 Chronic back pain     Past Surgical History:  Procedure Laterality Date  . ANKLE SURGERY Right   . CESAREAN SECTION  1984 and 1986   hodp pre term labor CS  . OVARIAN CYST SURGERY  1973  . SHOULDER ARTHROSCOPY WITH OPEN ROTATOR CUFF REPAIR AND DISTAL CLAVICLE ACROMINECTOMY Right 03/19/2017   Procedure: SHOULDER ARTHROSCOPY WITH OPEN ROTATOR CUFF REPAIR AND SUBACROMINAL DECOMPRESSION;  Surgeon: Thornton Park, MD;  Location: [facility] ORS;  Service: Orthopedics;  Laterality: Right;  . TUBAL LIGATION  1999  . WRIST GANGLION EXCISION      There were no vitals filed for this visit.      Subjective Assessment - 07/24/17 1436    Subjective Patient reports she had follow up with MD and is to continue physical therapy and is going to begin prednisone taper today. She is still having pain in front of upper arm, biceps region and tendon. If medication and therapy does not resolve her pain she may need further treatment that will be determined later.   Pertinent History May 2017 lifted a case of water and felt a pain in right shoulder. She had PT prior to surgery without lasting results and then had MRI and underwent RTC repair 03/19/2017. She also  has back pain.    Limitations Lifting;House hold activities;Other (comment)  Work   Patient Stated Goals return to full functional use right UE to return to prior level of function   Currently in Pain? Yes   Pain Score 4    Pain Location Shoulder   Pain Orientation Right   Pain Descriptors / Indicators Aching;Throbbing   Pain Type Surgical pain  03/19/2017   Pain Onset More than a month ago   Pain Frequency Intermittent      Objective: Palpation: tenderness along right UE along biceps, lateral aspect of shoulder and posterior aspect of shoulder AROM: increased pain right shoulder at 90 degrees elevation (pain is along anterior aspect into upper arm/biceps region)  Treatment:   Modalities: Ultrasound/high volt estim. Combination: 15 min.:1MHz pulsed 50% @ 1.1w/cm2; high volt intensity to tolerance (50-90 volts depending on area treated) right shoulder anterior,lateral shoulder region with patient seated in chair with right UE supported on pillow: goal: pain, improve ROM Modalities: Electrical stimulation: Russian stim. 10/10 cycle applied (2) electrodes to right shoulder periscapular muscles, lower trapezius with UE supported on pillow;  goal muscle re education High volt estim.clincial program for muscle spasms  (2) electrodes applied to right shoulder, biceps muscle,  intensity to tolerance with patient in sitting position with right UE supported on pillow goal: pain, spasms   Patient response to treatment: Patient demonstrated  significant decrease in tenderness right shoulder and was able to elevate right UE above shoulder level with greater ease and less pain <3/10 following [place] combination treatment.          PT Education - 07/24/17 1511    Education provided Yes   Education Details instructed in use of ice and pulleys to control pain and maintain ROM, prevent loss of motion   Person(s) Educated Patient   Methods Explanation   Comprehension Verbalized understanding              PT Long Term Goals - 05/28/17 1810      PT LONG TERM GOAL #1   Title Patient will demonstrate improved functional use right UE with decreased pain to mild with QuickDash score of 20% or less by 07/24/17   Baseline QuickDash 80%; 05/28/17 60%    Status Revised     PT LONG TERM GOAL #2   Title Patient will be independent with home program for pain control, flexibiltiy and strength by 07/24/2017 to allow self management following discharge from physical therapy    Baseline limited knowlege of appropriate pain control strategies, exercises and progression without moderate assistance   Status Revised               Plan - 07/24/17 1530    Clinical Impression Statement Patient continues with pain as primary limiting factor to improving functional use of right shoulder. She continues to respond well to [place] and estim. for pain control, muscle re educaiton for periscapular muscles and should continue with physical therapy intervention to further control pain and improve pain, function.    Rehab Potential Good   Clinical Impairments Affecting Rehab Potential (+)acute condition, motivated, prior level of function (-) chronic condition prior to surgery   PT Frequency 2x / week   PT Duration 8 weeks   PT Treatment/Interventions Patient/family education;Electrical Stimulation;Cryotherapy;Moist Heat;Neuromuscular re-education;Therapeutic exercise;Manual techniques   PT Next Visit Plan therapeutic exercise, pain control; electrical stimulation   PT Home Exercise Plan scapular retraction, AAROM right UE, shoulder, scapular control exercises; isomectrics at wall, light resistive exercises 1-2# weight supine forward flexion      Patient will benefit from skilled therapeutic intervention in order to improve the following deficits and impairments:  Decreased strength, Pain, Impaired perceived functional ability, Decreased activity tolerance, Decreased endurance, Increased muscle spasms,  Impaired UE functional use, Decreased range of motion  Visit Diagnosis: Acute pain of right shoulder  Other muscle spasm  Muscle weakness (generalized)     Problem List Patient Active Problem List   Diagnosis Date Noted  . Pre-operative examination 02/21/2017  . Low back pain 06/11/2016  . Osteopenia 09/30/2013  . Encounter for routine gynecological examination 01/18/2012  . Routine general medical examination at a health care facility 01/09/2012  . [place], [place] 02/06/2010  . DEGENERATIVE JOINT DISEASE, MILD 07/19/2009    Jomarie Longs PT 07/25/2017, 12:43 PM  [place] [facility] [address] [place], [address] Phone: [phone]   Fax:  [phone]  Name: Bridget [name] MRN: [id number] Date of Birth: 07-26-1956

## 2017-07-31 ENCOUNTER — Ambulatory Visit: Payer: 59 | Admitting: Physical Therapy

## 2017-07-31 ENCOUNTER — Encounter: Payer: Self-pay | Admitting: Physical Therapy

## 2017-07-31 DIAGNOSIS — M62838 Other muscle spasm: Secondary | ICD-10-CM

## 2017-07-31 DIAGNOSIS — M6281 Muscle weakness (generalized): Secondary | ICD-10-CM | POA: Diagnosis not present

## 2017-07-31 DIAGNOSIS — M25511 Pain in right shoulder: Secondary | ICD-10-CM

## 2017-08-01 NOTE — Therapy (Signed)
[place] [facility] [address] [place], [address] Phone: [phone]   Fax:  [phone]  Physical Therapy Treatment  Patient Details  Name: Bridget [name] MRN: [id number] Date of Birth: 1956-10-02 Referring Provider: Thornton Park MD  Encounter Date: 07/31/2017      PT End of Session - 07/31/17 1430    Visit Number 31   Number of Visits 44   Date for PT Re-Evaluation 09/11/17   PT Start Time 2355   PT Stop Time 1353   PT Time Calculation (min) 48 min   Activity Tolerance Patient tolerated treatment well;Patient limited by pain   Behavior During Therapy [facility] for tasks assessed/performed      Past Medical History:  Diagnosis Date  . 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 Chronic back pain     Past Surgical History:  Procedure Laterality Date  . ANKLE SURGERY Right   . CESAREAN SECTION  1984 and 1986   hodp pre term labor CS  . OVARIAN CYST SURGERY  1973  . SHOULDER ARTHROSCOPY WITH OPEN ROTATOR CUFF REPAIR AND DISTAL CLAVICLE ACROMINECTOMY Right 03/19/2017   Procedure: SHOULDER ARTHROSCOPY WITH OPEN ROTATOR CUFF REPAIR AND SUBACROMINAL DECOMPRESSION;  Surgeon: Thornton Park, MD;  Location: [facility] ORS;  Service: Orthopedics;  Laterality: Right;  . TUBAL LIGATION  1999  . WRIST GANGLION EXCISION      There were no vitals filed for this visit.      Subjective Assessment - 07/31/17 1306    Subjective Patient reports she is working on ROM at home and feels the steroids have helped some with pain. She is still having difficulty raising right arm above shoulder level due to upper arm/biceps pain   Pertinent History May 2017 lifted a case of water and felt a pain in right shoulder. She had PT prior to surgery without lasting results and then had MRI and underwent RTC repair 03/19/2017. She also has back pain.    Limitations Lifting;House hold activities;Other (comment)  Work   Patient Stated Goals return  to full functional use right UE to return to prior level of function   Currently in Pain? Yes   Pain Score 2    Pain Location Shoulder   Pain Orientation Right   Pain Descriptors / Indicators Aching   Pain Type Chronic pain  surgery 03/19/2017   Pain Onset More than a month ago   Pain Frequency Intermittent      Objective: Palpation: tenderness along right UE along biceps, lateral aspect of shoulder and posterior aspect of shoulder AROM: increased pain right shoulder at 90 degrees elevation (pain is along anterior aspect into upper arm/biceps region); able to raise right UE to 135 degrees with stabilization of biceps tendon  Treatment:   Modalities: Ultrasound/high volt estim. Combination: 10 min.:1MHz continuous @ 1.1w/cm2; high volt intensity to tolerance (50-90 volts depending on area treated)right shoulder anterior,lateral shoulder region with patient seated in chair with right UE supported on pillow: goal: pain, improve ROM Modalities: Electrical stimulation: Russian stim. 10/10 cycle applied (2) electrodes to right shoulder periscapular muscles, lower trapezius with UE supported on pillow;  goal muscle re education [place] stim. continuous (2) electrodes applied to right shoulder, biceps muscle,  intensity to tolerance with patient in sitting position with right UE supported on pillow goal: pain, spasms  Manual therapy: 13 min.; goal: pain, improve soft tissue elasticity STM performed along bicep tendon right UE and shoulder girdle superficial  techniques, compression techniques   Patient response to treatment: Patient demonstrated significant decrease in tenderness right shoulder and was able to elevate right UE above shoulder level with greater ease and less pain following treatment.             PT Education - 07/31/17 1400    Education provided Yes   Education Details instrucion in exercise techniques, progression to avoid increased pain in shoulder   Person(s)  Educated Patient   Methods Explanation   Comprehension Verbalized understanding             PT Long Term Goals - 07/31/17 1500      PT LONG TERM GOAL #1   Title Patient will demonstrate improved functional use right UE with decreased pain to mild with QuickDash score of 20% or less    Baseline QuickDash 80%; 05/28/17 60% deferred due to pain   Status Revised   Target Date 09/11/17     PT LONG TERM GOAL #2   Title Patient will be independent with home program for pain control, flexibiltiy and strength to allow self management following discharge from physical therapy    Baseline limited knowlege of appropriate pain control strategies, exercises and progression without moderate assistance   Status Revised   Target Date 09/11/17               Plan - 07/31/17 1500    Clinical Impression Statement Patient continues with pain as primary limiting factor to improvement with function use right UE. She is progressing steadily with pain control in physical therapy and is beginning to incresae ROM and strengthening exercises as tolerated. She should conitnue to imrpove with physical therapy intervention to further control pain and advance exercises to be able to return to prior level of function.    Rehab Potential Good   Clinical Impairments Affecting Rehab Potential (+)acute condition, motivated, prior level of function (-) chronic condition prior to surgery   PT Frequency 2x / week   PT Duration 6 weeks   PT Treatment/Interventions Patient/family education;Electrical Stimulation;Cryotherapy;Moist Heat;Neuromuscular re-education;Therapeutic exercise;Manual techniques   PT Next Visit Plan therapeutic exercise, pain control; electrical stimulation   PT Home Exercise Plan scapular retraction, AAROM right UE, shoulder, scapular control exercises;    Consulted and Agree with Plan of Care Patient      Patient will benefit from skilled therapeutic intervention in order to improve the  following deficits and impairments:  Decreased strength, Pain, Impaired perceived functional ability, Decreased activity tolerance, Decreased endurance, Increased muscle spasms, Impaired UE functional use, Decreased range of motion  Visit Diagnosis: Acute pain of right shoulder - Plan: PT plan of care cert/re-cert  Other muscle spasm - Plan: PT plan of care cert/re-cert  Muscle weakness (generalized) - Plan: PT plan of care cert/re-cert     Problem List Patient Active Problem List   Diagnosis Date Noted  . Pre-operative examination 02/21/2017  . Low back pain 06/11/2016  . Osteopenia 09/30/2013  . Encounter for routine gynecological examination 01/18/2012  . Routine general medical examination at a health care facility 01/09/2012  . [place], [place] 02/06/2010  . DEGENERATIVE JOINT DISEASE, MILD 07/19/2009    Jomarie Longs PT 08/01/2017, 6:50 PM  [place] [facility] [address] [place], [address] Phone: [phone]   Fax:  [phone]  Name: TAMBERLYN MIDGLEY MRN: [id number] Date of Birth: 09-29-1956

## 2017-08-05 ENCOUNTER — Ambulatory Visit: Payer: 59 | Admitting: Physical Therapy

## 2017-08-05 DIAGNOSIS — M25511 Pain in right shoulder: Secondary | ICD-10-CM | POA: Diagnosis not present

## 2017-08-05 DIAGNOSIS — M62838 Other muscle spasm: Secondary | ICD-10-CM | POA: Diagnosis not present

## 2017-08-05 DIAGNOSIS — M6281 Muscle weakness (generalized): Secondary | ICD-10-CM | POA: Diagnosis not present

## 2017-08-06 NOTE — Therapy (Signed)
[place] [address], [place], [address] Phone: [phone]   Fax:  [phone]  Physical Therapy Treatment  Patient Details  Name: Bridget [name] MRN: [id number] Date of Birth: October 05, 1956 Referring Provider: Thornton Park MD  Encounter Date: 08/05/2017      PT End of Session - 08/05/17 1833    Visit Number 32   Number of Visits 44   Date for PT Re-Evaluation 09/11/17   PT Start Time 3295   PT Stop Time 1830   PT Time Calculation (min) 57 min   Activity Tolerance Patient tolerated treatment well;Patient limited by pain   Behavior During Therapy [facility] for tasks assessed/performed      Past Medical History:  Diagnosis Date  . 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 Chronic back pain     Past Surgical History:  Procedure Laterality Date  . ANKLE SURGERY Right   . CESAREAN SECTION  1984 and 1986   hodp pre term labor CS  . OVARIAN CYST SURGERY  1973  . SHOULDER ARTHROSCOPY WITH OPEN ROTATOR CUFF REPAIR AND DISTAL CLAVICLE ACROMINECTOMY Right 03/19/2017   Procedure: SHOULDER ARTHROSCOPY WITH OPEN ROTATOR CUFF REPAIR AND SUBACROMINAL DECOMPRESSION;  Surgeon: Thornton Park, MD;  Location: [facility] ORS;  Service: Orthopedics;  Laterality: Right;  . TUBAL LIGATION  1999  . WRIST GANGLION EXCISION      There were no vitals filed for this visit.      Subjective Assessment - 08/05/17 1801    Subjective Patient reports she is working on ROM at home with pulleys and would like to review this to be sure she is performing correctly. She feels the steroids have helped some with pain. She is still having difficulty raising right arm above shoulder level due to upper arm/biceps pain   Pertinent History May 2017 lifted a case of water and felt a pain in right shoulder. She had PT prior to surgery without lasting results and then had MRI and underwent RTC repair 03/19/2017. She also has back pain.    Limitations  Lifting;House hold activities;Other (comment)  Work   Patient Stated Goals return to full functional use right UE to return to prior level of function   Currently in Pain? Yes   Pain Score 2    Pain Location Shoulder   Pain Orientation Right   Pain Descriptors / Indicators Aching   Pain Type Chronic pain   Pain Onset More than a month ago   Pain Frequency Intermittent      Objective: Palpation: tenderness along right UE along biceps, lateral aspect of shoulder and posterior aspect of shoulder AROM: increased pain right shoulder at 90 degrees elevation (pain is along anterior aspect into upper arm/biceps region); able to raise right UE to 135 degrees with stabilization of biceps tendon  Treatment:   Modalities: Ultrasound 10 min.:1MHz continuous @ 1.1w/cm2; right shoulder anterior,lateral shoulder region with patient seated in chair with right UE supported on pillow: goal: pain, improve ROM Modalities: Electrical stimulation: 20 min. [place] stim. 10/10 cycle applied (2) electrodes to right shoulder periscapular muscles, lower trapezius with UE supported on pillow; goal muscle re education [place] stim. continuous(2) electrodes applied to right shoulder, biceps muscle, intensity to tolerance with patient in sittingposition with right UE supported on pillow goal: pain, spasms  Manual therapy: 10 min.; goal: pain, improve soft tissue elasticity STM performed along bicep tendon right UE and shoulder girdle superficial techniques, compression techniques  with patient seated in chair with right UE supported on pillow  Therapeutic exercises: patient performed exercises for right shoulder/UE with guidance, assistance, tactile and verbal cues of therapist: goals: pain, independent with home exercises Supine lying: AAROM flexion, rotations right shoulder through close to full ROM  Rhythmic stabilization performed with right UE at 90 degrees flexion and rotations in neutral/plane of  scapula 10 reps each  Scapular retraction in side lying with manual resistance given x 10 reps Pulleys for correct alignment of shoulder and to monitor upper trapezius activation right shoulder with tactile cues 2 min.  Patient response to treatment: Patient demonstrated improved positioning and ability to perform exercises with assistance and cuing. Decreased upper trapezius activation when patient was assisted by pulley or therapist or opposite UE (more passive exercise). Patient with decreased tenderness along biceps by 25% with STM and modalities. .           PT Education - 08/05/17 1802    Education provided Yes   Education Details instruction in exercises with correct alignment of shoulder, decrease hiking   Person(s) Educated Patient   Methods Explanation;Demonstration;Verbal cues;Tactile cues   Comprehension Verbalized understanding;Returned demonstration;Verbal cues required;Tactile cues required             PT Long Term Goals - 07/31/17 1500      PT LONG TERM GOAL #1   Title Patient will demonstrate improved functional use right UE with decreased pain to mild with QuickDash score of 20% or less    Baseline QuickDash 80%; 05/28/17 60% deferred due to pain   Status Revised   Target Date 09/11/17     PT LONG TERM GOAL #2   Title Patient will be independent with home program for pain control, flexibiltiy and strength to allow self management following discharge from physical therapy    Baseline limited knowlege of appropriate pain control strategies, exercises and progression without moderate assistance   Status Revised   Target Date 09/11/17               Plan - 08/05/17 1835    Clinical Impression Statement Patient demonstrated improved alignment of shoulder with decreased hiking and activation of upper trapeizus with tactile and verbal cuing. She continues with pain in right shoulder with use and will benefit from additional physical therapy intervention to  improve pain/function. She is to contact MD regarding other options to assist with pain.    Rehab Potential Good   Clinical Impairments Affecting Rehab Potential (+)acute condition, motivated, prior level of function (-) chronic condition prior to surgery   PT Frequency 2x / week   PT Duration 6 weeks   PT Treatment/Interventions Patient/family education;Electrical Stimulation;Cryotherapy;Moist Heat;Neuromuscular re-education;Therapeutic exercise;Manual techniques   PT Next Visit Plan therapeutic exercise, pain control; electrical stimulation   PT Home Exercise Plan scapular retraction, AAROM right UE, shoulder, scapular control exercises;       Patient will benefit from skilled therapeutic intervention in order to improve the following deficits and impairments:  Decreased strength, Pain, Impaired perceived functional ability, Decreased activity tolerance, Decreased endurance, Increased muscle spasms, Impaired UE functional use, Decreased range of motion  Visit Diagnosis: Acute pain of right shoulder  Other muscle spasm  Muscle weakness (generalized)     Problem List Patient Active Problem List   Diagnosis Date Noted  . Pre-operative examination 02/21/2017  . Low back pain 06/11/2016  . Osteopenia 09/30/2013  . Encounter for routine gynecological examination 01/18/2012  . Routine general medical examination at a health  care facility 01/09/2012  . [place], [place] 02/06/2010  . DEGENERATIVE JOINT DISEASE, MILD 07/19/2009    Bridget [name] PT 08/06/2017, 6:08 PM  [place] [facility] [address] [place], [address] Phone: [phone]   Fax:  [phone]  Name: Bridget [name] MRN: [id number] Date of Birth: [birth year]

## 2017-08-07 ENCOUNTER — Ambulatory Visit: Payer: 59 | Admitting: Physical Therapy

## 2017-08-07 DIAGNOSIS — M25511 Pain in right shoulder: Secondary | ICD-10-CM | POA: Diagnosis not present

## 2017-08-07 DIAGNOSIS — M6281 Muscle weakness (generalized): Secondary | ICD-10-CM | POA: Diagnosis not present

## 2017-08-07 DIAGNOSIS — M62838 Other muscle spasm: Secondary | ICD-10-CM | POA: Diagnosis not present

## 2017-08-08 NOTE — Therapy (Signed)
[place] [facility] [address] [place], [address] Phone: [phone]   Fax:  [phone]  Physical Therapy Treatment  Patient Details  Name: Bridget [name] MRN: [id number] Date of Birth: [birth year] Referring Provider: Thornton Park MD  Encounter Date: 08/07/2017      PT End of Session - 08/07/17 1515    Visit Number 33   Number of Visits 44   Date for PT Re-Evaluation 09/11/17   PT Start Time 3664   PT Stop Time 1513   PT Time Calculation (min) 40 min   Activity Tolerance Patient tolerated treatment well;Patient limited by pain   Behavior During Therapy [facility] for tasks assessed/performed      Past Medical History:  Diagnosis Date  . 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 Chronic back pain     Past Surgical History:  Procedure Laterality Date  . ANKLE SURGERY Right   . CESAREAN SECTION  1984 and 1986   hodp pre term labor CS  . OVARIAN CYST SURGERY  1973  . SHOULDER ARTHROSCOPY WITH OPEN ROTATOR CUFF REPAIR AND DISTAL CLAVICLE ACROMINECTOMY Right 03/19/2017   Procedure: SHOULDER ARTHROSCOPY WITH OPEN ROTATOR CUFF REPAIR AND SUBACROMINAL DECOMPRESSION;  Surgeon: Thornton Park, MD;  Location: [facility] ORS;  Service: Orthopedics;  Laterality: Right;  . TUBAL LIGATION  1999  . WRIST GANGLION EXCISION      There were no vitals filed for this visit.      Subjective Assessment - 08/07/17 1440    Subjective Patient reports minimal improvement with pain in right shoulder and will contact MD regarding further evaluation and course of treatment as discussed with him last visit per patient report.    Pertinent History May 2017 lifted a case of water and felt a pain in right shoulder. She had PT prior to surgery without lasting results and then had MRI and underwent RTC repair 03/19/2017. She also has back pain.    Limitations Lifting;House hold activities;Other (comment)  Work   Patient Stated Goals return to  full functional use right UE to return to prior level of function   Currently in Pain? Yes   Pain Score 2    Pain Location Shoulder   Pain Orientation Right   Pain Descriptors / Indicators Aching   Pain Type Chronic pain   Pain Onset More than a month ago   Pain Frequency Intermittent        Objective: Palpation: tenderness along right UE along biceps, lateral aspect of shoulder and posterior aspect of shoulder; improved soft tissue elasticity  Treatment:   Modalities: Ultrasound 42min.:1MHz continuous@ 1.1w/cm2; right shoulder anterior,lateral shoulder region with patient seated in chair with right UE supported on pillow: goal: pain, improve ROM Modalities: Electrical stimulation: 20 min. [place] stim. 10/10 cycle applied (2) electrodes to right shoulder periscapular muscles, lower trapezius with UE supported on pillow; goal muscle re education [place] stim. continuous(2) electrodes applied to right shoulder, biceps muscle, intensity to tolerance with patient in sittingposition with right UE supported on pillow goal: pain, spasms   Patient response to treatment: Patient with decreased pain and improved sfot tissue elasticity with less tenderness following treatment. Verbalized good understanding of home program.           PT Education - 08/07/17 1532    Education provided Yes   Education Details progress with therapy, continue with home exercises for ROM, strength   Person(s) Educated Patient   Methods Explanation  Comprehension Verbalized understanding             PT Long Term Goals - 07/31/17 1500      PT LONG TERM GOAL #1   Title Patient will demonstrate improved functional use right UE with decreased pain to mild with QuickDash score of 20% or less    Baseline QuickDash 80%; 05/28/17 60% deferred due to pain   Status Revised   Target Date 09/11/17     PT LONG TERM GOAL #2   Title Patient will be independent with home program for pain control,  flexibiltiy and strength to allow self management following discharge from physical therapy    Baseline limited knowlege of appropriate pain control strategies, exercises and progression without moderate assistance   Status Revised   Target Date 09/11/17               Plan - 08/07/17 1530    Clinical Impression Statement Patient demonstrated improved alignment of shoulder and improved soft tissue elasticity in right UE following [place] and electrical stimulation. She is planning to follow up with MD to discuss further options for continued pain in right shoulder.    Rehab Potential Good   Clinical Impairments Affecting Rehab Potential (+)acute condition, motivated, prior level of function (-) chronic condition prior to surgery   PT Frequency 2x / week   PT Duration 6 weeks   PT Treatment/Interventions Patient/family education;Electrical Stimulation;Cryotherapy;Moist Heat;Neuromuscular re-education;Therapeutic exercise;Manual techniques   PT Next Visit Plan follow up wtih patient following appointment with MD   PT Home Exercise Plan scapular retraction, AAROM right UE, shoulder, scapular control exercises;       Patient will benefit from skilled therapeutic intervention in order to improve the following deficits and impairments:  Decreased strength, Pain, Impaired perceived functional ability, Decreased activity tolerance, Decreased endurance, Increased muscle spasms, Impaired UE functional use, Decreased range of motion  Visit Diagnosis: Acute pain of right shoulder  Other muscle spasm  Muscle weakness (generalized)     Problem List Patient Active Problem List   Diagnosis Date Noted  . Pre-operative examination 02/21/2017  . Low back pain 06/11/2016  . Osteopenia 09/30/2013  . Encounter for routine gynecological examination 01/18/2012  . Routine general medical examination at a health care facility 01/09/2012  . [place], [place] 02/06/2010  . DEGENERATIVE JOINT DISEASE,  MILD 07/19/2009    Jomarie Longs PT 08/08/2017, 9:44 PM  [facility] [place] [facility] [address], [place], [address] Phone: [phone]   Fax:  [phone]  Name: Bridget [name] MRN: [id number] Date of Birth: June 27, 1956

## 2017-08-12 ENCOUNTER — Ambulatory Visit: Payer: 59 | Admitting: Physical Therapy

## 2017-08-14 ENCOUNTER — Ambulatory Visit: Payer: 59 | Admitting: Physical Therapy

## 2017-08-14 DIAGNOSIS — M75111 Incomplete rotator cuff tear or rupture of right shoulder, not specified as traumatic: Secondary | ICD-10-CM | POA: Diagnosis not present

## 2017-08-14 DIAGNOSIS — M7521 Bicipital tendinitis, right shoulder: Secondary | ICD-10-CM | POA: Diagnosis not present

## 2017-08-14 DIAGNOSIS — M7551 Bursitis of right shoulder: Secondary | ICD-10-CM | POA: Diagnosis not present

## 2017-08-20 ENCOUNTER — Encounter: Payer: 59 | Admitting: Physical Therapy

## 2017-08-21 ENCOUNTER — Ambulatory Visit: Payer: 59 | Admitting: Physical Therapy

## 2017-08-22 ENCOUNTER — Encounter: Payer: 59 | Admitting: Physical Therapy

## 2017-08-28 DIAGNOSIS — M7521 Bicipital tendinitis, right shoulder: Secondary | ICD-10-CM | POA: Diagnosis not present

## 2017-08-28 DIAGNOSIS — M7551 Bursitis of right shoulder: Secondary | ICD-10-CM | POA: Diagnosis not present

## 2017-08-28 DIAGNOSIS — M75111 Incomplete rotator cuff tear or rupture of right shoulder, not specified as traumatic: Secondary | ICD-10-CM | POA: Diagnosis not present

## 2017-09-04 ENCOUNTER — Encounter: Payer: Self-pay | Admitting: Physical Therapy

## 2017-09-04 ENCOUNTER — Ambulatory Visit: Payer: 59 | Attending: Orthopedic Surgery | Admitting: Physical Therapy

## 2017-09-04 DIAGNOSIS — M25511 Pain in right shoulder: Secondary | ICD-10-CM | POA: Diagnosis not present

## 2017-09-04 DIAGNOSIS — M62838 Other muscle spasm: Secondary | ICD-10-CM | POA: Diagnosis not present

## 2017-09-04 DIAGNOSIS — M6281 Muscle weakness (generalized): Secondary | ICD-10-CM | POA: Diagnosis not present

## 2017-09-04 NOTE — Therapy (Signed)
[place] [facility] [address] [place], [address] Phone: [phone]   Fax:  [phone]  Physical Therapy Treatment  Patient Details  Name: Bridget [name] MRN: [id number] Date of Birth: [birth year] Referring Provider: Thornton Park MD  Encounter Date: 09/04/2017      PT End of Session - 09/04/17 1521    Visit Number 34   Number of Visits 44   Date for PT Re-Evaluation 09/11/17   PT Start Time 7425   PT Stop Time 1547   PT Time Calculation (min) 31 min   Activity Tolerance Patient tolerated treatment well;Patient limited by pain   Behavior During Therapy [facility] for tasks assessed/performed      Past Medical History:  Diagnosis Date  . 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 Chronic back pain     Past Surgical History:  Procedure Laterality Date  . ANKLE SURGERY Right   . CESAREAN SECTION  1984 and 1986   hodp pre term labor CS  . OVARIAN CYST SURGERY  1973  . SHOULDER ARTHROSCOPY WITH OPEN ROTATOR CUFF REPAIR AND DISTAL CLAVICLE ACROMINECTOMY Right 03/19/2017   Procedure: SHOULDER ARTHROSCOPY WITH OPEN ROTATOR CUFF REPAIR AND SUBACROMINAL DECOMPRESSION;  Surgeon: Thornton Park, MD;  Location: [facility] ORS;  Service: Orthopedics;  Laterality: Right;  . TUBAL LIGATION  1999  . WRIST GANGLION EXCISION      There were no vitals filed for this visit.      Subjective Assessment - 09/04/17 1518    Subjective Patient reports she is returning to therapy following cortisone injection right shoulder and she had some relief and improvement with ROM. Currently having discomfort, soreness in front of shoulder and biceps.    Pertinent History May 2017 lifted a case of water and felt a pain in right shoulder. She had PT prior to surgery without lasting results and then had MRI and underwent RTC repair 03/19/2017. She also has back pain.    Limitations Lifting;House hold activities;Other (comment)  Work   Patient  Stated Goals return to full functional use right UE to return to prior level of function   Currently in Pain? Yes   Pain Score 2    Pain Location Shoulder   Pain Orientation Right   Pain Descriptors / Indicators Aching   Pain Type Chronic pain   Pain Onset More than a month ago   Pain Frequency Intermittent        Objective: Palpation: tenderness along right UE along biceps AROM: right shoulder WNL with pain at 90 degrees forward elevation  Treatment:   Modalities: Ultrasound combination with high volt estim.: 24min.:1MHz continuous@ 1.1w/cm2, high volt estim. Intensity to tolerance; right upper arm/biceps region with patient seated in chair with right UE supported on pillow: goal: pain, improve ROM  Therapeutic exercise: patient performed with assistance of therapist with VC, tactile cues as needed: goals: pain, improve ROM, strength, function right UE Supine AAROM right shoulder all planes of motion in conjunction with STM to lats/external rotators   Patient response to treatment: Patient demonstrated decreased pain and tenderness right upper arm following [place] combination. Patient with full AAROM following exercise/STM.         PT Education - 09/04/17 1521    Education provided Yes   Education Details HEP instruction   Person(s) Educated Patient   Methods Explanation   Comprehension Verbalized understanding             PT Long  Term Goals - 07/31/17 1500      PT LONG TERM GOAL #1   Title Patient will demonstrate improved functional use right UE with decreased pain to mild with QuickDash score of 20% or less    Baseline QuickDash 80%; 05/28/17 60% deferred due to pain   Status Revised   Target Date 09/11/17     PT LONG TERM GOAL #2   Title Patient will be independent with home program for pain control, flexibiltiy and strength to allow self management following discharge from physical therapy    Baseline limited knowlege of appropriate pain control  strategies, exercises and progression without moderate assistance   Status Revised   Target Date 09/11/17               Plan - 09/04/17 1521    Clinical Impression Statement Patient demonstrated decreased pain and able to raise right UE above shoulder level with much less pain following treatment. She continues with limtiaitons in functional use of right UE and will benefit from additional physical therapy intervention.    Rehab Potential Good   Clinical Impairments Affecting Rehab Potential (+)acute condition, motivated, prior level of function (-) chronic condition prior to surgery   PT Frequency 2x / week   PT Duration 6 weeks   PT Treatment/Interventions Patient/family education;Electrical Stimulation;Cryotherapy;Moist Heat;Neuromuscular re-education;Therapeutic exercise;Manual techniques   PT Next Visit Plan follow up wtih patient following appointment with MD   PT Home Exercise Plan scapular retraction, AAROM right UE, shoulder, scapular control exercises;       Patient will benefit from skilled therapeutic intervention in order to improve the following deficits and impairments:  Decreased strength, Pain, Impaired perceived functional ability, Decreased activity tolerance, Decreased endurance, Increased muscle spasms, Impaired UE functional use, Decreased range of motion  Visit Diagnosis: Acute pain of right shoulder  Other muscle spasm  Muscle weakness (generalized)     Problem List Patient Active Problem List   Diagnosis Date Noted  . Pre-operative examination 02/21/2017  . Low back pain 06/11/2016  . Osteopenia 09/30/2013  . Encounter for routine gynecological examination 01/18/2012  . Routine general medical examination at a health care facility 01/09/2012  . [place], [place] 02/06/2010  . DEGENERATIVE JOINT DISEASE, MILD 07/19/2009    Jomarie Longs PT 09/05/2017, 9:20 PM  [place] [facility] [address], [place], [address] Phone: [phone]   Fax:  [phone]  Name: Bridget [name] MRN: [id number] Date of Birth: [birth year]

## 2017-09-06 DIAGNOSIS — M720 Palmar fascial fibromatosis [Dupuytren]: Secondary | ICD-10-CM | POA: Diagnosis not present

## 2017-09-10 ENCOUNTER — Encounter: Payer: 59 | Admitting: Physical Therapy

## 2017-09-11 ENCOUNTER — Encounter: Payer: Self-pay | Admitting: Physical Therapy

## 2017-09-11 ENCOUNTER — Ambulatory Visit: Payer: 59 | Admitting: Physical Therapy

## 2017-09-11 DIAGNOSIS — M25511 Pain in right shoulder: Secondary | ICD-10-CM | POA: Diagnosis not present

## 2017-09-11 DIAGNOSIS — M62838 Other muscle spasm: Secondary | ICD-10-CM | POA: Diagnosis not present

## 2017-09-11 DIAGNOSIS — M6281 Muscle weakness (generalized): Secondary | ICD-10-CM | POA: Diagnosis not present

## 2017-09-12 NOTE — Therapy (Signed)
[facility] [address], [place], [address] Phone: [phone]   Fax:  [phone]  Physical Therapy Treatment  Patient Details  Name: Bridget [name] MRN: [id number] Date of Birth: 11/13/1956 Referring Provider: Thornton Park MD  Encounter Date: 09/11/2017      PT End of Session - 09/11/17 1430    Visit Number 35   Number of Visits 44   Date for PT Re-Evaluation 09/11/17   PT Start Time 6270   PT Stop Time 1430   PT Time Calculation (min) 35 min   Activity Tolerance Patient tolerated treatment well;Patient limited by pain   Behavior During Therapy [facility] for tasks assessed/performed      Past Medical History:  Diagnosis Date  . 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 Chronic back pain     Past Surgical History:  Procedure Laterality Date  . ANKLE SURGERY Right   . CESAREAN SECTION  1984 and 1986   hodp pre term labor CS  . OVARIAN CYST SURGERY  1973  . SHOULDER ARTHROSCOPY WITH OPEN ROTATOR CUFF REPAIR AND DISTAL CLAVICLE ACROMINECTOMY Right 03/19/2017   Procedure: SHOULDER ARTHROSCOPY WITH OPEN ROTATOR CUFF REPAIR AND SUBACROMINAL DECOMPRESSION;  Surgeon: Thornton Park, MD;  Location: [facility] ORS;  Service: Orthopedics;  Laterality: Right;  . TUBAL LIGATION  1999  . WRIST GANGLION EXCISION      There were no vitals filed for this visit.      Subjective Assessment - 09/11/17 1400    Subjective Patient reports temporary relief of symptoms with US/estim.    Pertinent History May 2017 lifted a case of water and felt a pain in right shoulder. She had PT prior to surgery without lasting results and then had MRI and underwent RTC repair 03/19/2017. She also has back pain.    Limitations Lifting;House hold activities;Other (comment)  Work   Patient Stated Goals return to full functional use right UE to return to prior level of function   Currently in Pain? Yes   Pain Score 2    Pain Location  Shoulder   Pain Orientation Right   Pain Descriptors / Indicators Aching   Pain Type Chronic pain   Pain Onset More than a month ago   Pain Frequency Intermittent        Objective: Palpation: tenderness along right UE along biceps with moderate spasms AROM: right shoulder WNL with pain at 90 degrees forward elevation  Treatment:   Modalities: Ultrasound combination with high volt estim.: 61min:1MHz continuous@ 1.1w/cm2, high volt estim. Intensity to tolerance; right upper arm/biceps region with patient seated in chair with right UE supported on pillow: goal: pain, improve ROM  Manual therapy: 15 min.; goal: pain, spasms STM right UE along biceps shoulder to elbow: patient supine lying, superficial and deep technique including partial arm pull MFR techniques  Patient response to treatment: Patient demonstrated decreased pain and improved soft tissue elasticity following treatment. She continued with pain with forward elevation of right UE with pain in biceps belly up to shoulder anterior aspect.          PT Education - 09/11/17 1430    Education provided Yes   Education Details soft tissue mobilizaiton   Person(s) Educated Patient   Methods Explanation   Comprehension Verbalized understanding             PT Long Term Goals - 07/31/17 1500      PT LONG TERM GOAL #1  Title Patient will demonstrate improved functional use right UE with decreased pain to mild with QuickDash score of 20% or less    Baseline QuickDash 80%; 05/28/17 60% deferred due to pain   Status Revised   Target Date 09/11/17     PT LONG TERM GOAL #2   Title Patient will be independent with home program for pain control, flexibiltiy and strength to allow self management following discharge from physical therapy    Baseline limited knowlege of appropriate pain control strategies, exercises and progression without moderate assistance   Status Revised   Target Date 09/11/17                Plan - 09/11/17 1433    Clinical Impression Statement Patient demonstrated improved sof t tissue elasticity following STM and US/estim. to biceps right UE. She continues with pain with raising arm above shoulder level.    Rehab Potential Good   Clinical Impairments Affecting Rehab Potential (+)acute condition, motivated, prior level of function (-) chronic condition prior to surgery   PT Frequency 2x / week   PT Duration 6 weeks   PT Treatment/Interventions Patient/family education;Electrical Stimulation;Cryotherapy;Moist Heat;Neuromuscular re-education;Therapeutic exercise;Manual techniques   PT Next Visit Plan follow up wtih patient following appointment with MD   PT Home Exercise Plan scapular retraction, AAROM right UE, shoulder, scapular control exercises;       Patient will benefit from skilled therapeutic intervention in order to improve the following deficits and impairments:  Decreased strength, Pain, Impaired perceived functional ability, Decreased activity tolerance, Decreased endurance, Increased muscle spasms, Impaired UE functional use, Decreased range of motion  Visit Diagnosis: Acute pain of right shoulder  Other muscle spasm  Muscle weakness (generalized)     Problem List Patient Active Problem List   Diagnosis Date Noted  . Pre-operative examination 02/21/2017  . Low back pain 06/11/2016  . Osteopenia 09/30/2013  . Encounter for routine gynecological examination 01/18/2012  . Routine general medical examination at a health care facility 01/09/2012  . [place], [place] 02/06/2010  . DEGENERATIVE JOINT DISEASE, MILD 07/19/2009    Jomarie Longs PT 09/12/2017, 6:20 PM  [facility] [facility] [address] [place], [address] Phone: [phone]   Fax:  [phone]  Name: Bridget [name] MRN: [id number] Date of Birth: 05/03/1956

## 2017-09-16 ENCOUNTER — Encounter: Payer: Self-pay | Admitting: Physical Therapy

## 2017-09-16 ENCOUNTER — Ambulatory Visit: Payer: 59 | Admitting: Physical Therapy

## 2017-09-16 DIAGNOSIS — M25511 Pain in right shoulder: Secondary | ICD-10-CM

## 2017-09-16 DIAGNOSIS — M62838 Other muscle spasm: Secondary | ICD-10-CM

## 2017-09-16 DIAGNOSIS — M6281 Muscle weakness (generalized): Secondary | ICD-10-CM

## 2017-09-17 NOTE — Therapy (Signed)
[place] [address], [place], [address] Phone: [phone]   Fax:  [phone]  Physical Therapy Treatment  Patient Details  Name: Bridget [name] MRN: [id number] Date of Birth: 12-21-1956 Referring Provider: Thornton Park MD  Encounter Date: 09/16/2017      PT End of Session - 09/16/17 1813    Visit Number 36   Number of Visits 56   Date for PT Re-Evaluation 10/29/17   PT Start Time 6962   PT Stop Time 1805   PT Time Calculation (min) 35 min   Activity Tolerance Patient tolerated treatment well;Patient limited by pain   Behavior During Therapy [facility] for tasks assessed/performed      Past Medical History:  Diagnosis Date  . 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 Chronic back pain     Past Surgical History:  Procedure Laterality Date  . ANKLE SURGERY Right   . CESAREAN SECTION  1984 and 1986   hodp pre term labor CS  . OVARIAN CYST SURGERY  1973  . SHOULDER ARTHROSCOPY WITH OPEN ROTATOR CUFF REPAIR AND DISTAL CLAVICLE ACROMINECTOMY Right 03/19/2017   Procedure: SHOULDER ARTHROSCOPY WITH OPEN ROTATOR CUFF REPAIR AND SUBACROMINAL DECOMPRESSION;  Surgeon: Thornton Park, MD;  Location: [facility] ORS;  Service: Orthopedics;  Laterality: Right;  . TUBAL LIGATION  1999  . WRIST GANGLION EXCISION      There were no vitals filed for this visit.      Subjective Assessment - 09/16/17 1740    Subjective Patient reports she is seeing little difference in her right arm improving with continued pain and tightness in upper arm and unable to raise arm overhead without increased pain, difficulty.    Pertinent History May 2017 lifted a case of water and felt a pain in right shoulder. She had PT prior to surgery without lasting results and then had MRI and underwent RTC repair 03/19/2017. She also has back pain.    Limitations Lifting;House hold activities;Other (comment)  Work   Patient Stated Goals return to  full functional use right UE to return to prior level of function   Currently in Pain? Yes   Pain Score 2    Pain Location Shoulder   Pain Orientation Right   Pain Descriptors / Indicators Aching   Pain Type Chronic pain   Pain Onset More than a month ago   Pain Frequency Intermittent        Objective: Palpation: tenderness along right UE along biceps with moderate spasms  Treatment:   Modalities: Ultrasound combination with high volt estim.: 90min:1MHz continuous@ 1.1w/cm2, high volt estim. Intensity to tolerance; right upper arm/biceps regionwith patient supine with right UE supported on pillow: goal: pain, improve ROM  Manual therapy: 20 min.; goal: pain, spasms STM right UE along biceps shoulder to elbow: patient supine lying, superficial and deep technique including partial arm pull MFR techniques  Patient response to treatment: Patient demonstrated improved soft tissue elasticity in upper arm from biceps to near shoulder.         PT Education - 09/16/17 1800    Education provided Yes   Education Details re assessed home exercises and discussed progress with therapy   Person(s) Educated Patient   Methods Explanation   Comprehension Verbalized understanding             PT Long Term Goals - 09/16/17 1810      PT LONG TERM GOAL #1   Title Patient  will demonstrate improved functional use right UE with decreased pain to mild with QuickDash score of 20% or less    Baseline QuickDash 80%; 05/28/17 60% deferred due to pain; 09/16/2017 50%   Status Revised   Target Date 10/29/17     PT LONG TERM GOAL #2   Title Patient will be independent with home program for pain control, flexibiltiy and strength to allow self management following discharge from physical therapy    Baseline limited knowlege of appropriate pain control strategies, exercises and progression without moderate assistance   Status Revised   Target Date 10/29/17               Plan -  09/16/17 1815    Clinical Impression Statement Patient demonstrates temporary decrease in pain and able to raise right UE above shoulder level with less pain following treatment. She continues with limtiaitons in functional use of right UE and will benefit from additional physical therapy intervention.    Rehab Potential Good   Clinical Impairments Affecting Rehab Potential (+)acute condition, motivated, prior level of function (-) chronic condition prior to surgery   PT Frequency 2x / week   PT Duration 6 weeks   PT Treatment/Interventions Patient/family education;Electrical Stimulation;Cryotherapy;Moist Heat;Neuromuscular re-education;Therapeutic exercise;Manual techniques   PT Next Visit Plan follow up wtih patient following appointment with MD   PT Home Exercise Plan scapular retraction, AAROM right UE, shoulder, scapular control exercises;    Consulted and Agree with Plan of Care Patient      Patient will benefit from skilled therapeutic intervention in order to improve the following deficits and impairments:  Decreased strength, Pain, Impaired perceived functional ability, Decreased activity tolerance, Decreased endurance, Increased muscle spasms, Impaired UE functional use, Decreased range of motion  Visit Diagnosis: Acute pain of right shoulder - Plan: PT plan of care cert/re-cert  Other muscle spasm - Plan: PT plan of care cert/re-cert  Muscle weakness (generalized) - Plan: PT plan of care cert/re-cert     Problem List Patient Active Problem List   Diagnosis Date Noted  . Pre-operative examination 02/21/2017  . Low back pain 06/11/2016  . Osteopenia 09/30/2013  . Encounter for routine gynecological examination 01/18/2012  . Routine general medical examination at a health care facility 01/09/2012  . [place], [place] 02/06/2010  . DEGENERATIVE JOINT DISEASE, MILD 07/19/2009    Jomarie Longs PT 09/17/2017, 10:55 PM  [place] [address] [place], [address] Phone: [phone]   Fax:  [phone]  Name: MAKALEIGH REINARD MRN: [id number] Date of Birth: [birth year]

## 2017-09-18 ENCOUNTER — Ambulatory Visit: Payer: 59 | Admitting: Physical Therapy

## 2017-09-18 ENCOUNTER — Encounter: Payer: Self-pay | Admitting: Physical Therapy

## 2017-09-18 DIAGNOSIS — M6281 Muscle weakness (generalized): Secondary | ICD-10-CM | POA: Diagnosis not present

## 2017-09-18 DIAGNOSIS — M25511 Pain in right shoulder: Secondary | ICD-10-CM

## 2017-09-18 DIAGNOSIS — M62838 Other muscle spasm: Secondary | ICD-10-CM

## 2017-09-18 NOTE — Therapy (Signed)
[place] [facility] [address], [place], [address] Phone: [phone]   Fax:  [phone]  Physical Therapy Treatment  Patient Details  Name: Bridget [name] MRN: [id number] Date of Birth: 08-Feb-1956 Referring Provider: Thornton Park MD  Encounter Date: 09/18/2017      PT End of Session - 09/18/17 0945    Visit Number 37   Number of Visits 56   Date for PT Re-Evaluation 10/29/17   PT Start Time 0903   PT Stop Time 0945   PT Time Calculation (min) 42 min   Activity Tolerance Patient tolerated treatment well;Patient limited by pain   Behavior During Therapy [facility] for tasks assessed/performed      Past Medical History:  Diagnosis Date  . 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 Chronic back pain     Past Surgical History:  Procedure Laterality Date  . ANKLE SURGERY Right   . CESAREAN SECTION  1984 and 1986   hodp pre term labor CS  . OVARIAN CYST SURGERY  1973  . SHOULDER ARTHROSCOPY WITH OPEN ROTATOR CUFF REPAIR AND DISTAL CLAVICLE ACROMINECTOMY Right 03/19/2017   Procedure: SHOULDER ARTHROSCOPY WITH OPEN ROTATOR CUFF REPAIR AND SUBACROMINAL DECOMPRESSION;  Surgeon: Thornton Park, MD;  Location: [facility] ORS;  Service: Orthopedics;  Laterality: Right;  . TUBAL LIGATION  1999  . WRIST GANGLION EXCISION      There were no vitals filed for this visit.      Subjective Assessment - 09/18/17 0923    Subjective Patient reports she is seeing little difference in her right arm improving with continued pain and tightness in upper arm and unable to raise arm overhead without increased pain, difficulty.    Pertinent History May 2017 lifted a case of water and felt a pain in right shoulder. She had PT prior to surgery without lasting results and then had MRI and underwent RTC repair 03/19/2017. She also has back pain.    Limitations Lifting;House hold activities;Other (comment)  Work   Patient Stated Goals return to  full functional use right UE to return to prior level of function   Currently in Pain? Yes   Pain Score 2    Pain Location Shoulder   Pain Orientation Right   Pain Descriptors / Indicators Aching;Tightness   Pain Type Chronic pain   Pain Onset More than a month ago   Pain Frequency Intermittent      Objective: Palpation: tenderness along right UE along biceps with moderate spasms from shoulder to elbow along medial aspect and at shoulder attachment primarily AROM: right UE shoulder WNL with pain raising arm above shoulder level Strength: decreased scapular control/strength due to pain in shoulder and not using UE as much  Treatment:   Modalities: Ultrasound combination with high volt estim.: 65min:1MHz continuous and pulsed 50%@ 1.1w/cm2, high volt estim. Intensity to tolerance; right upper arm/biceps regionwith patient supine with right UE supported on pillow: goal: pain, improve ROM  Manual therapy: 13 min.; goal: pain, spasms STM right UE along biceps shoulder to elbow: patient supine lying, superficial and deep technique including using "the stick" to assist with STM  Therapeutic exercise: Patient performed with demonstration, verbal and tactile cues of therapist: goal: improve Quick dash score, strength Standing at wall: push ups through mid range motion x 10 Counter planks (demonstrated) Stretches for chest and biceps with arms up and out and extended back 3 reps hold 10 seconds each Upper trapezius stretching 3  x 20 seconds with patient standing with one arm supported Scapular retraction at door for posture correction Side lying scapular control exercises demonstrated for elevation/depression and protraction/retraction x 10 reps with last rep hold x 10 seconds  Patient response to treatment: Patient demonstrated improved soft tissue elasticity in upper arm elbow to shoulder by 50% following treatment. Patient demonstrated and verbalized good understanding of  exercises to continue at home. Continued with pain with raising right UE to shoulder level with pulling sensation and pain along biceps.           PT Education - 09/18/17 0930    Education provided Yes   Education Details re assessed home exercises and posture, positioning to decresae strain on shoulder; importance of exercises to continue for stability to continue if she needs further surgery   Person(s) Educated Patient   Methods Explanation;Demonstration;Verbal cues;Handout   Comprehension Returned demonstration;Verbalized understanding             PT Long Term Goals - 09/16/17 1810      PT LONG TERM GOAL #1   Title Patient will demonstrate improved functional use right UE with decreased pain to mild with QuickDash score of 20% or less    Baseline QuickDash 80%; 05/28/17 60% deferred due to pain; 09/16/2017 50%   Status Revised   Target Date 10/29/17     PT LONG TERM GOAL #2   Title Patient will be independent with home program for pain control, flexibiltiy and strength to allow self management following discharge from physical therapy    Baseline limited knowlege of appropriate pain control strategies, exercises and progression without moderate assistance   Status Revised   Target Date 10/29/17               Plan - 09/18/17 0947    Clinical Impression Statement Patient demonstrates very little lasting improvement with manual soft tissue techniques, US/estim. and exercises. She has improved soft tissue mobility with significant changes noted in soft tisseu elasticity and decreased spasms in biceps and  Shoulder. However her pain remains about the same and limits activties using right UE above shoulder level. No further physical therapy recommended at this time and patient is to follow up wtih Dr. Mack Guise to determine further options for treatment.    Rehab Potential Good   Clinical Impairments Affecting Rehab Potential (+)acute condition, motivated, prior level of  function (-) chronic condition prior to surgery   PT Frequency 2x / week   PT Duration 6 weeks   PT Treatment/Interventions Patient/family education;Electrical Stimulation;Cryotherapy;Moist Heat;Neuromuscular re-education;Therapeutic exercise;Manual techniques   PT Next Visit Plan follow up with patient following appointment with MD   PT Home Exercise Plan scapular retraction, AAROM right UE, shoulder, scapular control exercises;    Consulted and Agree with Plan of Care Patient      Patient will benefit from skilled therapeutic intervention in order to improve the following deficits and impairments:  Decreased strength, Pain, Impaired perceived functional ability, Decreased activity tolerance, Decreased endurance, Increased muscle spasms, Impaired UE functional use, Decreased range of motion  Visit Diagnosis: Acute pain of right shoulder  Other muscle spasm  Muscle weakness (generalized)     Problem List Patient Active Problem List   Diagnosis Date Noted  . Pre-operative examination 02/21/2017  . Low back pain 06/11/2016  . Osteopenia 09/30/2013  . Encounter for routine gynecological examination 01/18/2012  . Routine general medical examination at a health care facility 01/09/2012  . [place], [place] 02/06/2010  . DEGENERATIVE JOINT  DISEASE, MILD 07/19/2009    Jomarie Longs PT 09/18/2017, 5:31 PM  [facility] [address], [place], [address] Phone: [phone]   Fax:  [phone]  Name: Bridget [name] MRN: [id number] Date of Birth: Oct 07, 1956

## 2017-09-25 ENCOUNTER — Ambulatory Visit: Payer: 59 | Admitting: Physical Therapy

## 2017-09-30 ENCOUNTER — Encounter: Payer: 59 | Admitting: Physical Therapy

## 2017-10-02 ENCOUNTER — Encounter: Payer: 59 | Admitting: Physical Therapy

## 2017-10-07 ENCOUNTER — Encounter: Payer: 59 | Admitting: Physical Therapy

## 2017-10-09 ENCOUNTER — Encounter: Payer: 59 | Admitting: Physical Therapy

## 2017-10-11 DIAGNOSIS — Z85828 Personal history of other malignant neoplasm of skin: Secondary | ICD-10-CM | POA: Diagnosis not present

## 2017-10-11 DIAGNOSIS — D225 Melanocytic nevi of trunk: Secondary | ICD-10-CM | POA: Diagnosis not present

## 2017-10-11 DIAGNOSIS — D2271 Melanocytic nevi of right lower limb, including hip: Secondary | ICD-10-CM | POA: Diagnosis not present

## 2017-10-11 DIAGNOSIS — D485 Neoplasm of uncertain behavior of skin: Secondary | ICD-10-CM | POA: Diagnosis not present

## 2017-10-11 DIAGNOSIS — D2262 Melanocytic nevi of left upper limb, including shoulder: Secondary | ICD-10-CM | POA: Diagnosis not present

## 2017-10-11 DIAGNOSIS — C44319 Basal cell carcinoma of skin of other parts of face: Secondary | ICD-10-CM | POA: Diagnosis not present

## 2017-10-14 ENCOUNTER — Encounter: Payer: 59 | Admitting: Physical Therapy

## 2017-10-16 ENCOUNTER — Encounter: Payer: 59 | Admitting: Physical Therapy

## 2017-10-17 DIAGNOSIS — S46111D Strain of muscle, fascia and tendon of long head of biceps, right arm, subsequent encounter: Secondary | ICD-10-CM | POA: Diagnosis not present

## 2017-10-17 DIAGNOSIS — Z01818 Encounter for other preprocedural examination: Secondary | ICD-10-CM | POA: Diagnosis not present

## 2017-10-17 DIAGNOSIS — M7501 Adhesive capsulitis of right shoulder: Secondary | ICD-10-CM | POA: Diagnosis not present

## 2017-10-17 DIAGNOSIS — M25511 Pain in right shoulder: Secondary | ICD-10-CM | POA: Diagnosis not present

## 2017-10-18 ENCOUNTER — Other Ambulatory Visit: Payer: Self-pay | Admitting: Orthopedic Surgery

## 2017-10-18 ENCOUNTER — Encounter
Admission: RE | Admit: 2017-10-18 | Discharge: 2017-10-18 | Disposition: A | Discharge status: Home / Self Care | Payer: 59 | Source: Ambulatory Visit | Attending: Orthopedic Surgery | Admitting: Orthopedic Surgery

## 2017-10-18 HISTORY — DX: Malignant (primary) neoplasm, unspecified: C80.1

## 2017-10-18 HISTORY — DX: Gastro-esophageal reflux disease without esophagitis: K21.9

## 2017-10-18 NOTE — Patient Instructions (Signed)
  Your procedure is scheduled on: 10-31-17 THURSDAY Report to Same Day Surgery 2nd floor medical mall [facility] Entrance-take elevator on left to 2nd floor.  Check in with surgery information desk.) To find out your arrival time please call [phone] between 1PM - 3PM on 10-30-17 [facility]  Remember: Instructions that are not followed completely may result in serious medical risk, up to and including death, or upon the discretion of your surgeon and anesthesiologist your surgery may need to be rescheduled.    _x___ 1. Do not eat food after midnight the night before your procedure. NO GUM CHEWING OR HARD CANDIES.  You may drink clear liquids up to 2 hours before you are scheduled to arrive at the hospital for your procedure.  Do not drink clear liquids within 2 hours of your scheduled arrival to the hospital.  Clear liquids include  --Water or Apple juice without pulp  --Clear carbohydrate beverage such as ClearFast or Gatorade  --Black Coffee or Clear Tea (No milk, no creamers, do not add anything to the coffee or Tea)      __x__ 2. No Alcohol for 24 hours before or after surgery.   __x__3. No Smoking for 24 prior to surgery.   ____  4. Bring all medications with you on the day of surgery if instructed.    __x__ 5. Notify your doctor if there is any change in your medical condition     (cold, fever, infections).     Do not wear jewelry, make-up, hairpins, clips or nail polish.  Do not wear lotions, powders, or perfumes. You may wear deodorant.  Do not shave 48 hours prior to surgery. Men may shave face and neck.  Do not bring valuables to the hospital.    [facility] is not responsible for any belongings or valuables.               Contacts, dentures or bridgework may not be worn into surgery.  Leave your suitcase in the car. After surgery it may be brought to your room.  For patients admitted to the hospital, discharge time is determined by your treatment team.   Patients  discharged the day of surgery will not be allowed to drive home.  You will need someone to drive you home and stay with you the night of your procedure.    Please read over the following fact sheets that you were given:     _x___ Take anti-hypertensive listed below, cardiac, seizure, asthma, anti-reflux and psychiatric medicines. These include:  1. NONE  2.  3.  4.  5.  6.  ____Fleets enema or Magnesium Citrate as directed.   _x___ Use CHG Soap or sage wipes as directed on instruction sheet   ____ Use inhalers on the day of surgery and bring to hospital day of surgery  ____ Stop Metformin and Janumet 2 days prior to surgery.    ____ Take 1/2 of usual insulin dose the night before surgery and none on the morning surgery.   ____ Follow recommendations from Cardiologist, Pulmonologist or PCP regarding  stopping Aspirin, Coumadin, Plavix ,Eliquis, Effient, or Pradaxa, and Pletal.  X____Stop Anti-inflammatories such as Advil, Aleve, IBUPROFEN, Motrin, Naproxen,MELOXICAM, Naprosyn, Goodies powders or aspirin products NOW-OK to take Tylenol    ____ Stop supplements until after surgery.     ____ Bring C-Pap to the hospital.

## 2017-10-21 ENCOUNTER — Encounter: Payer: 59 | Admitting: Physical Therapy

## 2017-10-23 ENCOUNTER — Encounter: Payer: 59 | Admitting: Physical Therapy

## 2017-10-30 MED ORDER — CEFAZOLIN SODIUM-DEXTROSE 2-4 GM/100ML-% IV SOLN
2.0000 g | INTRAVENOUS | Status: AC
Start: 2017-10-31 — End: 2017-10-31
  Administered 2017-10-31: 2 g via INTRAVENOUS

## 2017-10-31 ENCOUNTER — Other Ambulatory Visit: Payer: Self-pay

## 2017-10-31 ENCOUNTER — Ambulatory Visit: Payer: 59 | Admitting: Anesthesiology

## 2017-10-31 ENCOUNTER — Ambulatory Visit
Admission: RE | Admit: 2017-10-31 | Discharge: 2017-10-31 | Disposition: A | Discharge status: Home / Self Care | Payer: 59 | Source: Ambulatory Visit | Attending: Orthopedic Surgery | Admitting: Orthopedic Surgery

## 2017-10-31 ENCOUNTER — Encounter
Admission: RE | Disposition: A | Discharge status: Home / Self Care | Payer: Self-pay | Source: Ambulatory Visit | Attending: Orthopedic Surgery

## 2017-10-31 ENCOUNTER — Encounter: Payer: Self-pay | Admitting: *Deleted

## 2017-10-31 DIAGNOSIS — M7521 Bicipital tendinitis, right shoulder: Secondary | ICD-10-CM | POA: Diagnosis not present

## 2017-10-31 DIAGNOSIS — M25511 Pain in right shoulder: Secondary | ICD-10-CM | POA: Diagnosis not present

## 2017-10-31 DIAGNOSIS — M7501 Adhesive capsulitis of right shoulder: Secondary | ICD-10-CM | POA: Insufficient documentation

## 2017-10-31 DIAGNOSIS — K219 Gastro-esophageal reflux disease without esophagitis: Secondary | ICD-10-CM | POA: Diagnosis not present

## 2017-10-31 DIAGNOSIS — M19011 Primary osteoarthritis, right shoulder: Secondary | ICD-10-CM | POA: Diagnosis not present

## 2017-10-31 DIAGNOSIS — G8918 Other acute postprocedural pain: Secondary | ICD-10-CM | POA: Diagnosis not present

## 2017-10-31 DIAGNOSIS — M66821 Spontaneous rupture of other tendons, right upper arm: Secondary | ICD-10-CM | POA: Diagnosis not present

## 2017-10-31 DIAGNOSIS — M7551 Bursitis of right shoulder: Secondary | ICD-10-CM | POA: Diagnosis not present

## 2017-10-31 DIAGNOSIS — M7541 Impingement syndrome of right shoulder: Secondary | ICD-10-CM | POA: Insufficient documentation

## 2017-10-31 DIAGNOSIS — S46201D Unspecified injury of muscle, fascia and tendon of other parts of biceps, right arm, subsequent encounter: Secondary | ICD-10-CM | POA: Diagnosis not present

## 2017-10-31 HISTORY — PX: SHOULDER ARTHROSCOPY: SHX128

## 2017-10-31 LAB — CBC WITH DIFFERENTIAL/PLATELET
BASOS ABS: 0 10*3/uL (ref 0–0.1)
BASOS PCT: 1 %
Eosinophils Absolute: 0.2 10*3/uL (ref 0–0.7)
Eosinophils Relative: 4 %
HCT: 39.5 % (ref 35.0–47.0)
HEMOGLOBIN: 13.3 g/dL (ref 12.0–16.0)
LYMPHS PCT: 25 %
Lymphs Abs: 1.3 10*3/uL (ref 1.0–3.6)
MCH: 29.5 pg (ref 26.0–34.0)
MCHC: 33.8 g/dL (ref 32.0–36.0)
MCV: 87.3 fL (ref 80.0–100.0)
MONOS PCT: 8 %
Monocytes Absolute: 0.5 10*3/uL (ref 0.2–0.9)
NEUTROS PCT: 62 %
Neutro Abs: 3.4 10*3/uL (ref 1.4–6.5)
Platelets: 276 10*3/uL (ref 150–440)
RBC: 4.52 MIL/uL (ref 3.80–5.20)
RDW: 14.1 % (ref 11.5–14.5)
WBC: 5.5 10*3/uL (ref 3.6–11.0)

## 2017-10-31 LAB — BASIC METABOLIC PANEL
ANION GAP: 9 (ref 5–15)
BUN: 18 mg/dL (ref 6–20)
CALCIUM: 9 mg/dL (ref 8.9–10.3)
CHLORIDE: 105 mmol/L (ref 101–111)
CO2: 26 mmol/L (ref 22–32)
Creatinine, Ser: 0.62 mg/dL (ref 0.44–1.00)
GFR calc non Af Amer: >60 mL/min (ref >60)
Glucose, Bld: 96 mg/dL (ref 65–99)
POTASSIUM: 3.7 mmol/L (ref 3.5–5.1)
Sodium: 140 mmol/L (ref 135–145)

## 2017-10-31 LAB — PROTIME-INR
INR: 1
Prothrombin Time: 13.1 seconds (ref 11.4–15.2)

## 2017-10-31 LAB — APTT: aPTT: 29 seconds (ref 24–36)

## 2017-10-31 SURGERY — ARTHROSCOPY, SHOULDER
Anesthesia: Regional | Site: Shoulder | Laterality: Right | Wound class: Clean

## 2017-10-31 MED ORDER — NEOMYCIN-POLYMYXIN B GU 40-200000 IR SOLN
Status: AC
Start: 2017-10-31 — End: ?
  Filled 2017-10-31: qty 2

## 2017-10-31 MED ORDER — PHENYLEPHRINE HCL 10 MG/ML IJ SOLN
INTRAMUSCULAR | Status: AC
Start: 2017-10-31 — End: ?
  Filled 2017-10-31: qty 1

## 2017-10-31 MED ORDER — ONDANSETRON HCL 4 MG/2ML IJ SOLN
INTRAMUSCULAR | Status: AC
  Filled 2017-10-31: qty 2

## 2017-10-31 MED ORDER — DEXAMETHASONE SODIUM PHOSPHATE 10 MG/ML IJ SOLN
INTRAMUSCULAR | Status: DC | PRN
  Administered 2017-10-31: 10 mg via INTRAVENOUS

## 2017-10-31 MED ORDER — EPINEPHRINE 30 MG/30ML IJ SOLN
INTRAMUSCULAR | Status: AC
  Filled 2017-10-31: qty 1

## 2017-10-31 MED ORDER — EPHEDRINE SULFATE 50 MG/ML IJ SOLN
INTRAMUSCULAR | Status: DC | PRN
  Administered 2017-10-31 (×3): 5 mg via INTRAVENOUS
  Administered 2017-10-31: 10 mg via INTRAVENOUS
  Administered 2017-10-31 (×2): 5 mg via INTRAVENOUS

## 2017-10-31 MED ORDER — EPHEDRINE SULFATE 50 MG/ML IJ SOLN
INTRAMUSCULAR | Status: AC
  Filled 2017-10-31: qty 1

## 2017-10-31 MED ORDER — CEFAZOLIN SODIUM-DEXTROSE 2-4 GM/100ML-% IV SOLN
INTRAVENOUS | Status: AC
  Filled 2017-10-31: qty 100

## 2017-10-31 MED ORDER — MIDAZOLAM HCL 2 MG/2ML IJ SOLN
INTRAMUSCULAR | Status: AC
  Administered 2017-10-31: 1 mg via INTRAVENOUS
  Filled 2017-10-31: qty 2

## 2017-10-31 MED ORDER — OXYCODONE HCL 5 MG PO TABS: 5.0000 mg | ORAL_TABLET | ORAL | 0 refills | Status: DC | PRN

## 2017-10-31 MED ORDER — LIDOCAINE HCL (CARDIAC) 20 MG/ML IV SOLN
INTRAVENOUS | Status: DC | PRN
  Administered 2017-10-31: 100 mg via INTRAVENOUS

## 2017-10-31 MED ORDER — LIDOCAINE HCL (PF) 1 % IJ SOLN
INTRAMUSCULAR | Status: AC
  Filled 2017-10-31: qty 5

## 2017-10-31 MED ORDER — LACTATED RINGERS IV SOLN
INTRAVENOUS | Status: DC
  Administered 2017-10-31: 08:00:00 via INTRAVENOUS

## 2017-10-31 MED ORDER — BUPIVACAINE HCL (PF) 0.25 % IJ SOLN
INTRAMUSCULAR | Status: AC
  Filled 2017-10-31: qty 30

## 2017-10-31 MED ORDER — SUGAMMADEX SODIUM 200 MG/2ML IV SOLN
INTRAVENOUS | Status: DC | PRN
  Administered 2017-10-31: 150 mg via INTRAVENOUS

## 2017-10-31 MED ORDER — SUCCINYLCHOLINE CHLORIDE 20 MG/ML IJ SOLN
INTRAMUSCULAR | Status: AC
  Filled 2017-10-31: qty 1

## 2017-10-31 MED ORDER — PROMETHAZINE HCL 25 MG/ML IJ SOLN: 6.2500 mg | INTRAMUSCULAR | Status: DC | PRN

## 2017-10-31 MED ORDER — FENTANYL CITRATE (PF) 100 MCG/2ML IJ SOLN
50.0000 ug | Freq: Once | INTRAMUSCULAR | Status: AC
  Administered 2017-10-31: 50 ug via INTRAVENOUS

## 2017-10-31 MED ORDER — ROCURONIUM BROMIDE 50 MG/5ML IV SOLN
INTRAVENOUS | Status: AC
  Filled 2017-10-31: qty 1

## 2017-10-31 MED ORDER — ACETAMINOPHEN 10 MG/ML IV SOLN
INTRAVENOUS | Status: AC
  Filled 2017-10-31: qty 100

## 2017-10-31 MED ORDER — PHENYLEPHRINE HCL 10 MG/ML IJ SOLN
INTRAMUSCULAR | Status: DC | PRN
  Administered 2017-10-31 (×3): 100 ug via INTRAVENOUS
  Administered 2017-10-31: 150 ug via INTRAVENOUS
  Administered 2017-10-31: 100 ug via INTRAVENOUS
  Administered 2017-10-31: 200 ug via INTRAVENOUS
  Administered 2017-10-31: 100 ug via INTRAVENOUS

## 2017-10-31 MED ORDER — GLYCOPYRROLATE 0.2 MG/ML IJ SOLN
INTRAMUSCULAR | Status: AC
  Filled 2017-10-31: qty 1

## 2017-10-31 MED ORDER — BUPIVACAINE HCL (PF) 0.25 % IJ SOLN
INTRAMUSCULAR | Status: DC | PRN
  Administered 2017-10-31: 30 mL

## 2017-10-31 MED ORDER — DEXAMETHASONE SODIUM PHOSPHATE 10 MG/ML IJ SOLN
INTRAMUSCULAR | Status: AC
  Filled 2017-10-31: qty 1

## 2017-10-31 MED ORDER — ONDANSETRON HCL 4 MG/2ML IJ SOLN
INTRAMUSCULAR | Status: DC | PRN
  Administered 2017-10-31: 4 mg via INTRAVENOUS

## 2017-10-31 MED ORDER — FENTANYL CITRATE (PF) 100 MCG/2ML IJ SOLN
INTRAMUSCULAR | Status: AC
  Filled 2017-10-31: qty 2

## 2017-10-31 MED ORDER — CHLORHEXIDINE GLUCONATE CLOTH 2 % EX PADS: 6.0000 | MEDICATED_PAD | Freq: Once | CUTANEOUS | Status: DC

## 2017-10-31 MED ORDER — EPINEPHRINE PF 1 MG/ML IJ SOLN
INTRAMUSCULAR | Status: DC | PRN
  Administered 2017-10-31: 12 mL

## 2017-10-31 MED ORDER — LIDOCAINE HCL (PF) 2 % IJ SOLN
INTRAMUSCULAR | Status: AC
  Filled 2017-10-31: qty 10

## 2017-10-31 MED ORDER — ROPIVACAINE HCL 5 MG/ML IJ SOLN
INTRAMUSCULAR | Status: DC | PRN
  Administered 2017-10-31: 30 mL via PERINEURAL

## 2017-10-31 MED ORDER — NEOMYCIN-POLYMYXIN B GU 40-200000 IR SOLN
Status: DC | PRN
  Administered 2017-10-31: 2 mL

## 2017-10-31 MED ORDER — SUGAMMADEX SODIUM 200 MG/2ML IV SOLN
INTRAVENOUS | Status: AC
  Filled 2017-10-31: qty 2

## 2017-10-31 MED ORDER — FENTANYL CITRATE (PF) 100 MCG/2ML IJ SOLN
INTRAMUSCULAR | Status: DC | PRN
  Administered 2017-10-31 (×2): 50 ug via INTRAVENOUS

## 2017-10-31 MED ORDER — ROPIVACAINE HCL 5 MG/ML IJ SOLN
INTRAMUSCULAR | Status: AC
  Filled 2017-10-31: qty 30

## 2017-10-31 MED ORDER — ROCURONIUM BROMIDE 100 MG/10ML IV SOLN
INTRAVENOUS | Status: DC | PRN
  Administered 2017-10-31: 30 mg via INTRAVENOUS
  Administered 2017-10-31: 10 mg via INTRAVENOUS

## 2017-10-31 MED ORDER — LIDOCAINE HCL (PF) 1 % IJ SOLN
INTRAMUSCULAR | Status: DC | PRN
  Administered 2017-10-31: 5 mL via SUBCUTANEOUS

## 2017-10-31 MED ORDER — FAMOTIDINE 20 MG PO TABS
20.0000 mg | ORAL_TABLET | Freq: Once | ORAL | Status: AC
  Administered 2017-10-31: 20 mg via ORAL

## 2017-10-31 MED ORDER — PHENYLEPHRINE HCL 10 MG/ML IJ SOLN: INTRAMUSCULAR | Status: DC | PRN

## 2017-10-31 MED ORDER — PROPOFOL 10 MG/ML IV BOLUS
INTRAVENOUS | Status: DC | PRN
  Administered 2017-10-31: 150 mg via INTRAVENOUS

## 2017-10-31 MED ORDER — FAMOTIDINE 20 MG PO TABS
ORAL_TABLET | ORAL | Status: AC
  Filled 2017-10-31: qty 1

## 2017-10-31 MED ORDER — PROPOFOL 10 MG/ML IV BOLUS
INTRAVENOUS | Status: AC
  Filled 2017-10-31: qty 20

## 2017-10-31 MED ORDER — SEVOFLURANE IN SOLN
RESPIRATORY_TRACT | Status: AC
  Filled 2017-10-31: qty 250

## 2017-10-31 MED ORDER — MIDAZOLAM HCL 2 MG/2ML IJ SOLN
1.0000 mg | Freq: Once | INTRAMUSCULAR | Status: AC
  Administered 2017-10-31: 1 mg via INTRAVENOUS

## 2017-10-31 MED ORDER — SODIUM CHLORIDE 0.9 % IV SOLN
INTRAVENOUS | Status: DC | PRN
  Administered 2017-10-31: 50 ug/min via INTRAVENOUS

## 2017-10-31 MED ORDER — SUCCINYLCHOLINE CHLORIDE 20 MG/ML IJ SOLN
INTRAMUSCULAR | Status: DC | PRN
  Administered 2017-10-31: 100 mg via INTRAVENOUS

## 2017-10-31 MED ORDER — ACETAMINOPHEN 10 MG/ML IV SOLN
INTRAVENOUS | Status: DC | PRN
Start: 2017-10-31 — End: 2017-10-31
  Administered 2017-10-31: 1000 mg via INTRAVENOUS

## 2017-10-31 MED ORDER — FENTANYL CITRATE (PF) 100 MCG/2ML IJ SOLN: 25.0000 ug | INTRAMUSCULAR | Status: DC | PRN

## 2017-10-31 MED ORDER — FENTANYL CITRATE (PF) 100 MCG/2ML IJ SOLN
INTRAMUSCULAR | Status: AC
  Administered 2017-10-31: 50 ug via INTRAVENOUS
  Filled 2017-10-31: qty 2

## 2017-10-31 SURGICAL SUPPLY — 78 items
ADAPTER IRRIG TUBE 2 SPIKE SOL (ADAPTER) ×6 IMPLANT
ANCHOR SUT BIOCOMP LK 2.9X12.5 (Anchor) ×3 IMPLANT
BUR RADIUS 4.0X18.5 (BURR) ×3 IMPLANT
BUR RADIUS 5.5 (BURR) ×3 IMPLANT
CANISTER SUCT LVC 12 LTR MEDI- (MISCELLANEOUS) IMPLANT
CANNULA 5.75X7 CRYSTAL CLEAR (CANNULA) ×6 IMPLANT
CANNULA PARTIAL THREAD 2X7 (CANNULA) ×3 IMPLANT
CANNULA TWIST IN 8.25X9CM (CANNULA) IMPLANT
CLOSURE WOUND 1/2 X4 (GAUZE/BANDAGES/DRESSINGS) ×1
CONNECTOR PERFECT PASSER (CONNECTOR) IMPLANT
COOLER POLAR GLACIER W/PUMP (MISCELLANEOUS) IMPLANT
CRADLE LAMINECT ARM (MISCELLANEOUS) ×3 IMPLANT
DEVICE SUCT BLK HOLE OR FLOOR (MISCELLANEOUS) ×3 IMPLANT
DRAPE IMP U-DRAPE 54X76 (DRAPES) ×6 IMPLANT
DRAPE INCISE IOBAN 66X45 STRL (DRAPES) ×3 IMPLANT
DRAPE SHEET LG 3/4 BI-LAMINATE (DRAPES) ×3 IMPLANT
DRAPE U-SHAPE 47X51 STRL (DRAPES) IMPLANT
DURAPREP 26ML APPLICATOR (WOUND CARE) ×12 IMPLANT
ELECT REM PT RETURN 9FT ADLT (ELECTROSURGICAL) ×3
ELECTRODE REM PT RTRN 9FT ADLT (ELECTROSURGICAL) ×1 IMPLANT
GAUZE PETRO XEROFOAM 1X8 (MISCELLANEOUS) ×3 IMPLANT
GAUZE SPONGE 4X4 12PLY STRL (GAUZE/BANDAGES/DRESSINGS) ×3 IMPLANT
GLOVE BIO SURGEON STRL SZ7.5 (GLOVE) ×3 IMPLANT
GLOVE BIOGEL PI IND STRL 7.5 (GLOVE) ×4 IMPLANT
GLOVE BIOGEL PI IND STRL 9 (GLOVE) ×1 IMPLANT
GLOVE BIOGEL PI INDICATOR 7.5 (GLOVE) ×8
GLOVE BIOGEL PI INDICATOR 9 (GLOVE) ×2
GLOVE SURG 9.0 ORTHO LTXF (GLOVE) ×6 IMPLANT
GOWN STRL REUS TWL 2XL XL LVL4 (GOWN DISPOSABLE) ×3 IMPLANT
GOWN STRL REUS W/ TWL LRG LVL3 (GOWN DISPOSABLE) ×1 IMPLANT
GOWN STRL REUS W/ TWL LRG LVL4 (GOWN DISPOSABLE) ×1 IMPLANT
GOWN STRL REUS W/TWL LRG LVL3 (GOWN DISPOSABLE) ×2
GOWN STRL REUS W/TWL LRG LVL4 (GOWN DISPOSABLE) ×2
IV LACTATED RINGER IRRG 3000ML (IV SOLUTION) ×24
IV LR IRRIG 3000ML ARTHROMATIC (IV SOLUTION) ×12 IMPLANT
KIT INSERTION 2.9 PUSHLOCK (KITS) ×3 IMPLANT
KIT RM TURNOVER STRD PROC AR (KITS) ×3 IMPLANT
KIT STABILIZATION SHOULDER (MISCELLANEOUS) ×3 IMPLANT
KIT SUTURE 2.8 Q-FIX DISP (MISCELLANEOUS) IMPLANT
KIT SUTURETAK 3.0 INSERT PERC (KITS) IMPLANT
LABEL OR SOLS (LABEL) ×3 IMPLANT
MANIFOLD NEPTUNE II (INSTRUMENTS) ×3 IMPLANT
MASK FACE SPIDER DISP (MASK) ×3 IMPLANT
MAT BLUE FLOOR 46X72 FLO (MISCELLANEOUS) ×6 IMPLANT
NDL SAFETY 18GX1.5 (NEEDLE) ×3 IMPLANT
NDL SAFETY 22GX1.5 (NEEDLE) ×3 IMPLANT
NEEDLE HYPO 22GX1.5 SAFETY (NEEDLE) ×3 IMPLANT
NS IRRIG 500ML POUR BTL (IV SOLUTION) ×3 IMPLANT
PACK ARTHROSCOPY SHOULDER (MISCELLANEOUS) ×3 IMPLANT
PAD WRAPON POLAR SHDR XLG (MISCELLANEOUS) IMPLANT
PASSER SUT CAPTURE FIRST (SUTURE) IMPLANT
SET TUBE SUCT SHAVER OUTFL 24K (TUBING) ×3 IMPLANT
SET TUBE TIP INTRA-ARTICULAR (MISCELLANEOUS) ×3 IMPLANT
STRAP SAFETY BODY (MISCELLANEOUS) ×3 IMPLANT
STRIP CLOSURE SKIN 1/2X4 (GAUZE/BANDAGES/DRESSINGS) ×2 IMPLANT
SUT ETHILON 4-0 (SUTURE) ×2
SUT ETHILON 4-0 FS2 18XMFL BLK (SUTURE) ×1
SUT LASSO 90 DEG SD STR (SUTURE) IMPLANT
SUT MNCRL 4-0 (SUTURE) ×2
SUT MNCRL 4-0 27XMFL (SUTURE) ×1
SUT PDS AB 0 CT1 27 (SUTURE) IMPLANT
SUT PERFECTPASSER WHITE CART (SUTURE) IMPLANT
SUT VIC AB 0 CT1 36 (SUTURE) ×3 IMPLANT
SUT VIC AB 2-0 CT2 27 (SUTURE) ×3 IMPLANT
SUTURE ETHLN 4-0 FS2 18XMF BLK (SUTURE) ×1 IMPLANT
SUTURE MAGNUM WIRE 2X48 BLK (SUTURE) IMPLANT
SUTURE MNCRL 4-0 27XMF (SUTURE) ×1 IMPLANT
SUTURE TAPE FIBERLINK 1.3 LOOP (SUTURE) ×1 IMPLANT
SUTURETAPE FIBERLINK 1.3 LOOP (SUTURE) ×3
SYR 10ML 18GX1 1/2 (NEEDLE) ×3 IMPLANT
SYR 3ML 18GX1 1/2 (SYRINGE) ×3 IMPLANT
SYRINGE 10CC LL (SYRINGE) ×3 IMPLANT
TAPE MICROFOAM 4IN (TAPE) ×3 IMPLANT
TUBING ARTHRO INFLOW-ONLY STRL (TUBING) ×3 IMPLANT
TUBING CONNECTING 10 (TUBING) ×2 IMPLANT
TUBING CONNECTING 10' (TUBING) ×1
WAND HAND CNTRL MULTIVAC 90 (MISCELLANEOUS) ×3 IMPLANT
WRAPON POLAR PAD SHDR XLG (MISCELLANEOUS)

## 2017-10-31 NOTE — Anesthesia Preprocedure Evaluation (Signed)
Anesthesia Evaluation  Patient identified by MRN, date of birth, ID band Patient awake    Reviewed: Allergy & Precautions, NPO status , Patient's Chart, lab work & pertinent test results  History of Anesthesia Complications Negative for: history of anesthetic complications  Airway Mallampati: II       Dental   Pulmonary neg pulmonary ROS,           Cardiovascular negative cardio ROS       Neuro/Psych negative neurological ROS     GI/Hepatic Neg liver ROS, GERD  ,  Endo/Other  negative endocrine ROS  Renal/GU negative Renal ROS     Musculoskeletal   Abdominal   Peds  Hematology negative hematology ROS (+)   Anesthesia Other Findings Past Medical History: No date: Arthritis     Comment:  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 No date: Cancer ([place])     Comment:  BASAL CELL No date: Chronic back pain No date: GERD (gastroesophageal reflux disease)     Comment:  OCC   Reproductive/Obstetrics negative OB ROS                             Anesthesia Physical  Anesthesia Plan  ASA: II  Anesthesia Plan: General and Regional   Post-op Pain Management: GA combined w/ Regional for post-op pain   Induction: Intravenous  PONV Risk Score and Plan: 3 and Ondansetron and Dexamethasone  Airway Management Planned: Oral ETT  Additional Equipment:   Intra-op Plan:   Post-operative Plan: Extubation in OR  Informed Consent: I have reviewed the patients History and Physical, chart, labs and discussed the procedure including the risks, benefits and alternatives for the proposed anesthesia with the patient or authorized representative who has indicated his/her understanding and acceptance.     Plan Discussed with: Anesthesiologist, CRNA and Surgeon  Anesthesia Plan Comments:         Anesthesia Quick Evaluation

## 2017-10-31 NOTE — Discharge Instructions (Signed)

## 2017-10-31 NOTE — Anesthesia Procedure Notes (Signed)
Procedure Name: Intubation Date/Time: 10/31/2017 8:54 AM Performed by: Johnna Acosta, CRNA Pre-anesthesia Checklist: Patient identified, Emergency Drugs available, Suction available, Patient being monitored and Timeout performed Patient Re-evaluated:Patient Re-evaluated prior to induction Oxygen Delivery Method: Circle system utilized Preoxygenation: Pre-oxygenation with 100% oxygen Induction Type: IV induction Ventilation: Mask ventilation without difficulty Laryngoscope Size: Miller and 2 Grade View: Grade II Tube type: Oral Tube size: 7.0 mm Number of attempts: 1 Airway Equipment and Method: Stylet Placement Confirmation: ETT inserted through vocal cords under direct vision,  positive ETCO2 and breath sounds checked- equal and bilateral Secured at: 21 cm Tube secured with: Tape Dental Injury: Teeth and Oropharynx as per pre-operative assessment

## 2017-10-31 NOTE — Anesthesia Postprocedure Evaluation (Signed)
Anesthesia Post Note  Patient: Bridget [name]  Procedure(s) Performed: ARTHROSCOPY SHOULDER WITH LYSIS OF ADHESION, SUBACROMINAL DECOMPRESSION,DISTAL CLAVICLE EXCISION. (Right Shoulder)  Patient location during evaluation: PACU Anesthesia Type: Regional Level of consciousness: awake and alert Pain management: pain level controlled Vital Signs Assessment: post-procedure vital signs reviewed and stable Respiratory status: spontaneous breathing, nonlabored ventilation, respiratory function stable and patient connected to nasal cannula oxygen Cardiovascular status: blood pressure returned to baseline and stable Postop Assessment: no apparent nausea or vomiting Anesthetic complications: no     Last Vitals:  Vitals:   10/31/17 1243 10/31/17 1337  BP: (!) 101/46 (!) 93/47  Pulse: 77 85  Resp: 16 16  Temp: (!) 36.2 C   SpO2: 97% 97%    Last Pain:  Vitals:   10/31/17 1243  TempSrc: Oral  PainSc:                  Amaka Clan

## 2017-10-31 NOTE — Op Note (Addendum)
10/31/2017  11:43 AM  PATIENT:  Bridget [name]    PRE-OPERATIVE DIAGNOSIS: Right shoulder biceps tendonitis vs. tear, shoulder impingement with bursitis, acromioclavicular arthrosis, possible adhesive capsulitis  POST-OPERATIVE DIAGNOSIS:  Right shoulder biceps partial thickness tear, shoulder impingement with bursitis, acromioclavicular arthrosis, adhesive capsulitis  PROCEDURE:  RIGHT SHOULDER ARTHROSCOPIC WITH LYSIS OF ADHESIONS, SUBACROMINAL DECOMPRESSION, DISTAL CLAVICLE EXCISION and BICEPS TENODESIS  SURGEON:  Thornton Park, MD  ANESTHESIA:   General  PREOPERATIVE INDICATIONS:  Bridget [name] is a  61 y.o. female with a diagnosis of right shoulder biceps tendonitis vs. Tear, shoulder impingement with bursitis, possible adhesive capsulitis who failed conservative measures including physical therapy and corticosteroid injection and elected for surgical management.    I discussed the risks and benefits of surgery. The risks include but are not limited to infection, bleeding requiring blood transfusion, nerve or blood vessel injury, joint stiffness or loss of motion, persistent pain, weakness or instability, and hardware failure and the need for further surgery. Medical risks include but are not limited to DVT and pulmonary embolism, myocardial infarction, stroke, pneumonia, respiratory failure and death. Patient understood these risks and wished to proceed.   OPERATIVE IMPLANTS: Arthrex pushlock x 1  OPERATIVE FINDINGS: Intra-articular tear of long head of the biceps, shoulder impingement with bursitis, acromioclavicular arthrosis and adhesions and capsular thickening of the anterior shoulder joint.   OPERATIVE PROCEDURE: The patient was met in the preoperative area. The right shoulder was signed with the word yes and my initials according the hospital's correct site of surgery protocol.  History and physical was updated. Patient underwent a right interscalene block by the  anesthesia service in the preoperative area. Patient was brought to the operating room where she underwent general anesthesia. The patient was placed in a beachchair position.  A spider arm positioner was used for this case. Examination under anesthesia revealed no limitation of motion or instability with load shift testing. The patient had a negative sulcus sign.  Patient was prepped and draped in a sterile fashion. A timeout was performed to verify the patient's name, date of birth, medical record number, correct site of surgery and correct procedure to be performed there was also used to verify the patient received antibiotics that all appropriate instruments, implants and radiographs studies were available in the room. Once all in attendance were in agreement case began.  Bony landmarks were drawn out with a surgical marker along with proposed arthroscopy incisions. These were pre-injected with 1% lidocaine plain. An 11 blade was used to establish a posterior portal through which the arthroscope was placed in the glenohumeral joint via a size 8 mm athroscopic cannula. A full diagnostic examination of the shoulder was performed.  The arthroscopic findings are noted above.  The patient's previous rotator cuff repair was intact.  Patient was found to have capsular thickening and adhesions in the anterior shoulder.   A lysis of adhesions was performed in the rotator interval with a 90 Arthrocare wand.    The patient was also found to have a high grade partial thickness tear of the intra-articular portion of the long head of the biceps tendon.   An Arthrex Loop n' tack system was used to perform a biceps tenodesis at the top of the intertuberous groove at the superior border of the subscapularis which was not torn.  A luggage tag suture was placed around the biceps through the anterior portal.   A birdbeak grasper was then used to penetrate through the tendon, distal to  the luggage tag suture and the tail of  the luggage tag suture was then shuttled through the tendon creating the loop n' tack.  A single Arthrex pushlock anchor was the placed to secure the biceps tendon at the top of the groove.  An arthroscopic drill for the anchor was placed through the anterior portal.  The hole was drilled at the top of the groove.  A nitinol wire was used to mark the drill hole.  The pushlock was then placed in the hole as the nitinol wire was removed.  The suture was tensioned and the pushlock advanced into place with a mallet.  The tenodesis was probed and felt to be stable.   Final athroscopic images of the tenodesis were taken.      The arthroscope was then placed in the subacromial space. Extensive bursitis was encountered and debrided using a 4-0 resector shaver blade and a 90 ArthroCare wand from a lateral portal. A subacromial decompression was also performed using a 5.5 mm resector shaver blade from the lateral portal. Acromioclavicular arthrosis was also seen.  A distal clavicle excision was performed through the anterior portal with a 5.5 mm resector shaver blade.  Skin closure for the three arthroscopic incisions was performed with 4-0 nylon.  0.25% Marcaine plain was then injected into the subacromial space for postoperative pain control. A dry sterile dressing was applied.  The patient was placed in her Slingshot 2 sling and Polar Care sleeve that she had brought in with her from her previous surgery..  All sharp and it instrument counts were correct at the conclusion of the case. I was scrubbed and present for the entire case. I spoke with the patient's husband postoperatively to let him know the case had gone without complication and the patient was stable in recovery room.

## 2017-10-31 NOTE — H&P (Signed)
The patient has been re-examined, and the chart reviewed, and there have been no interval changes to the documented history and physical.    The risks, benefits, and alternatives have been discussed at length, and the patient is willing to proceed.   

## 2017-10-31 NOTE — Anesthesia Post-op Follow-up Note (Signed)
Anesthesia QCDR form completed.        

## 2017-10-31 NOTE — Transfer of Care (Signed)
Immediate Anesthesia Transfer of Care Note  Patient: Bridget [name]  Procedure(s) Performed: ARTHROSCOPY SHOULDER WITH LYSIS OF ADHESION, SUBACROMINAL DECOMPRESSION,DISTAL CLAVICLE EXCISION. (Right Shoulder)  Patient Location: PACU  Anesthesia Type:General  Level of Consciousness: sedated  Airway & Oxygen Therapy: Patient Spontanous Breathing and Patient connected to face mask oxygen  Post-op Assessment: Report given to RN and Post -op Vital signs reviewed and stable  Post vital signs: Reviewed and stable  Last Vitals:  Vitals:   10/31/17 0822 10/31/17 0826  BP: (!) 113/59 (!) 96/58  Pulse: 70 64  Resp: 14 14  Temp:    SpO2: 100% 100%    Last Pain:  Vitals:   10/31/17 0710  TempSrc: Tympanic  PainSc: 2          Complications: No apparent anesthesia complications

## 2017-10-31 NOTE — Anesthesia Procedure Notes (Signed)
Anesthesia Regional Block: Interscalene brachial plexus block   Pre-Anesthetic Checklist: ,, timeout performed, Correct Patient, Correct Site, Correct Laterality, Correct Procedure, Correct Position, site marked, Risks and benefits discussed,  Surgical consent,  Pre-op evaluation,  At surgeon's request and post-op pain management  Laterality: Right and Upper  Prep: chloraprep       Needles:  Injection technique: Single-shot  Needle Type: Stimiplex     Needle Length: 5cm  Needle Gauge: 22     Additional Needles:   Procedures:,,,, ultrasound used (permanent image in chart),,,,  Narrative:  Start time: 10/31/2017 8:22 AM End time: 10/31/2017 8:26 AM Injection made incrementally with aspirations every 5 mL.  Performed by: Personally  Anesthesiologist: Victory Clan, MD  Additional Notes: Functioning IV was confirmed and monitors were applied.  A 12mm 22ga Stimuplex needle was used. Sterile prep and drape,hand hygiene and sterile gloves were used.  Negative aspiration and negative test dose prior to incremental administration of local anesthetic. The patient tolerated the procedure well.

## 2017-11-01 ENCOUNTER — Encounter: Payer: Self-pay | Admitting: Orthopedic Surgery

## 2017-11-06 ENCOUNTER — Telehealth: Payer: Self-pay | Admitting: Family Medicine

## 2017-11-06 ENCOUNTER — Other Ambulatory Visit: Payer: Self-pay | Admitting: Family Medicine

## 2017-11-06 DIAGNOSIS — Z1231 Encounter for screening mammogram for malignant neoplasm of breast: Secondary | ICD-10-CM

## 2017-11-06 MED ORDER — CIPROFLOXACIN HCL 500 MG PO TABS: 500.0000 mg | ORAL_TABLET | Freq: Two times a day (BID) | ORAL | 0 refills | Status: DC

## 2017-11-06 MED ORDER — ATOVAQUONE-PROGUANIL HCL 250-100 MG PO TABS: 1.0000 | ORAL_TABLET | Freq: Every day | ORAL | 0 refills | Status: DC

## 2017-11-06 NOTE — Telephone Encounter (Signed)
Copied from [place] [id number]. Topic: Quick Communication - See Telephone Encounter >> Nov 06, 2017 10:50 AM Ether Griffins B wrote: CRM for notification. See Telephone encounter for:  Pt going on  missions trip in Jan and would like an rx for cpyro and malarone  11/06/17.

## 2017-11-06 NOTE — Telephone Encounter (Signed)
Pt is going to [place], she is going from Jan. 21-28 of 2019 but she is trying to get the medication before the end of the year for insurance purposes. Pt uses [facility] pharmacy

## 2017-11-06 NOTE — Telephone Encounter (Signed)
I sent them Hope trip goes well !

## 2017-11-06 NOTE — Telephone Encounter (Signed)
Please ask where she is going and how many days she will be gone.   Feedback to the call center--please always ask these questions regarding travel and also note cipro is spelled cipro   Thanks

## 2017-11-06 NOTE — Telephone Encounter (Signed)
Left voicemail letting pt know Rx sent 

## 2017-11-10 IMAGING — MR MR SHOULDER*R* W/O CM
4 of 5 series · 19 of 40 positions shown · non-contrast
Comparison: None.

CLINICAL DATA: Right shoulder pain.  Injured 6 months ago.

EXAM:
MRI OF THE RIGHT SHOULDER WITHOUT CONTRAST
TECHNIQUE: Multiplanar, multisequence MR imaging of the shoulder was performed.
No intravenous contrast was administered.

[Series 5: T2 fat-sat · axial · 3.0mm · 0.27mm/px · z∈[-50,+27]mm · 5 of 27 slices shown (1 of 3)]
[im 1/27]
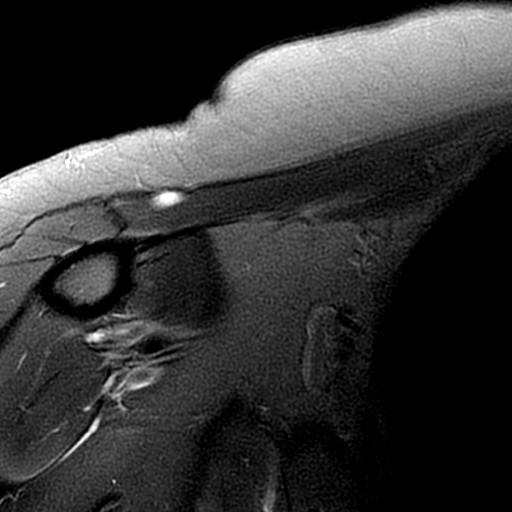
[im 4/27]
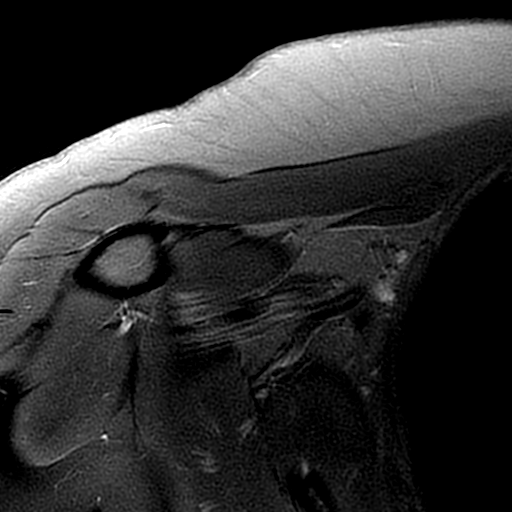
[im 8/27]
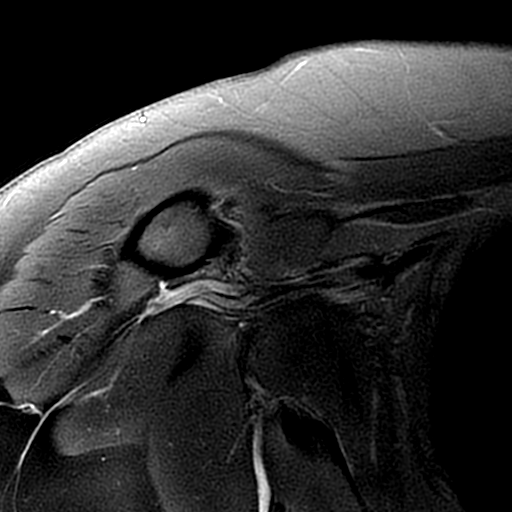
[im 15/27]
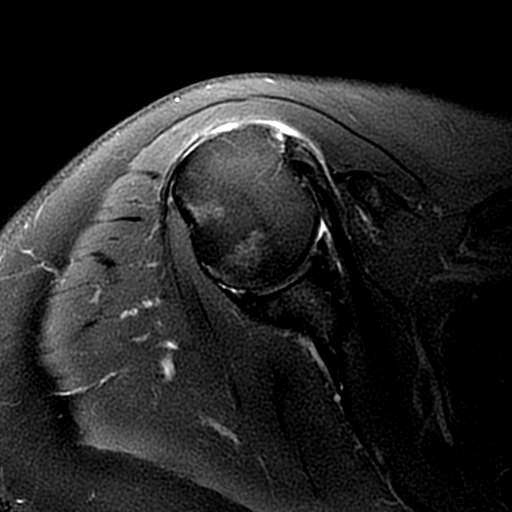
[im 23/27]
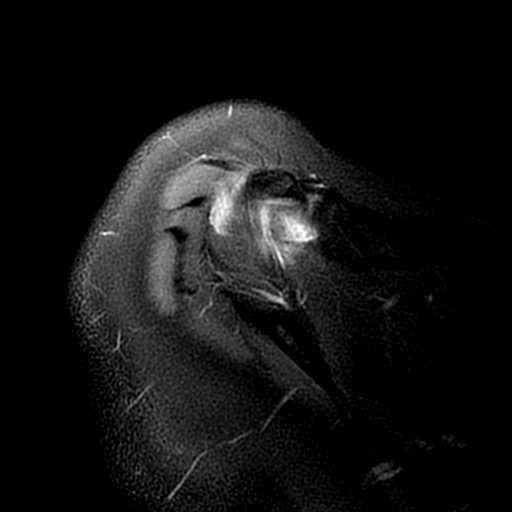

[Series 7: T2 fat-sat · coronal · 3.0mm · 0.27mm/px · 3 of 25 slices shown (2 of 3)]
[im 4/25]
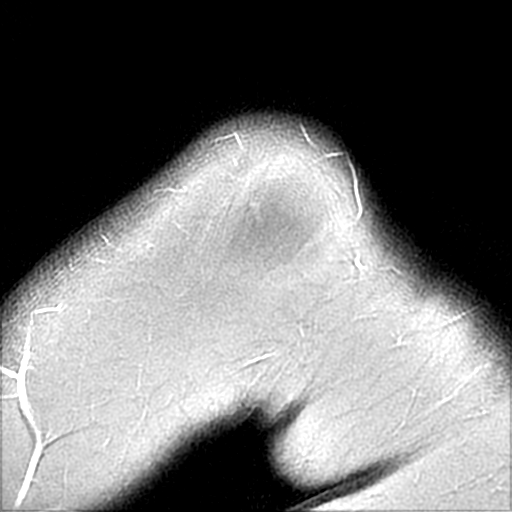
[im 14/25]
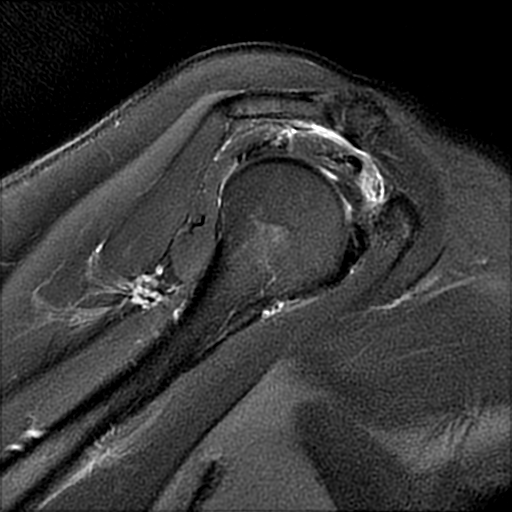
[im 21/25]
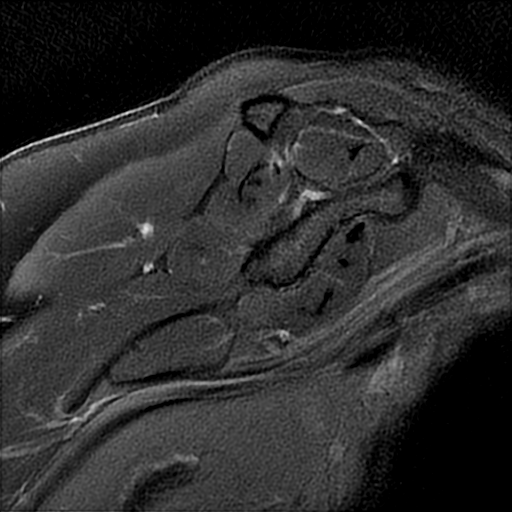

[Series 8: PD · oblique · 3.0mm · 0.27mm/px · 8 of 24 slices shown]
[im 1/24]
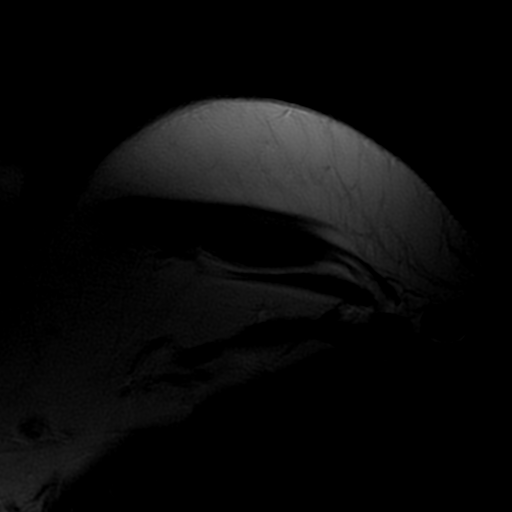
[im 4/24]
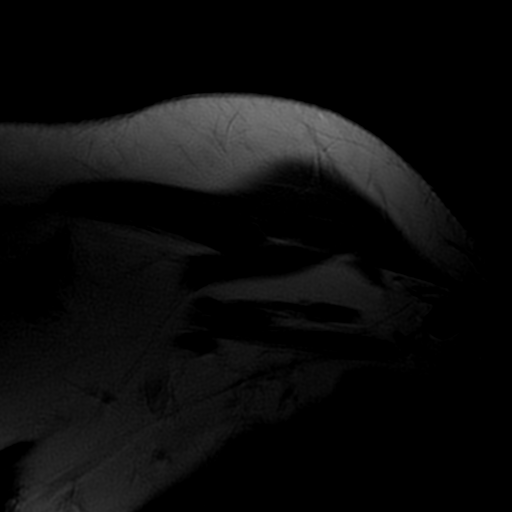
[im 7/24]
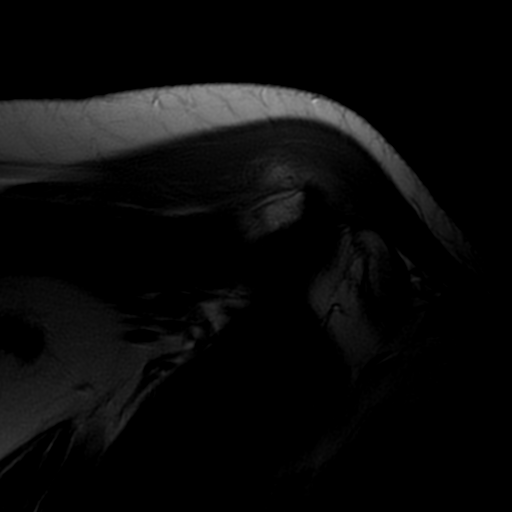
[im 10/24]
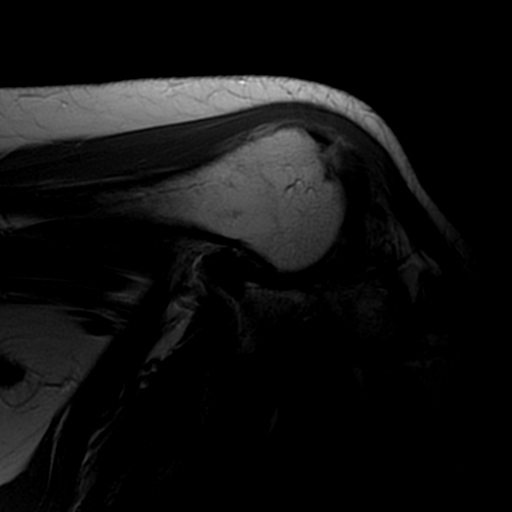
[im 14/24]
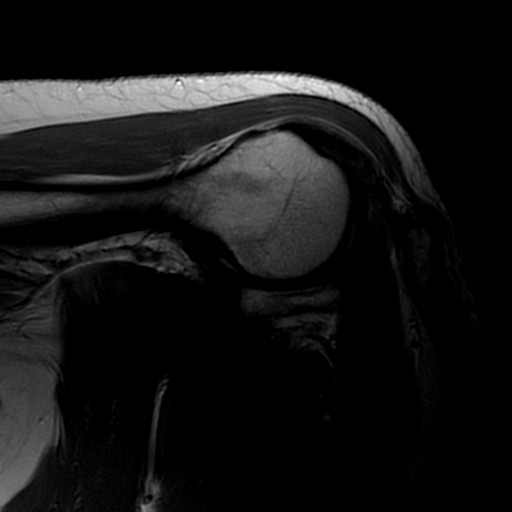
[im 17/24]
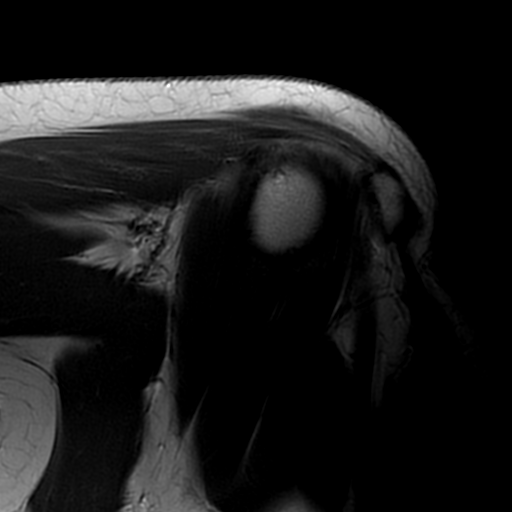
[im 20/24]
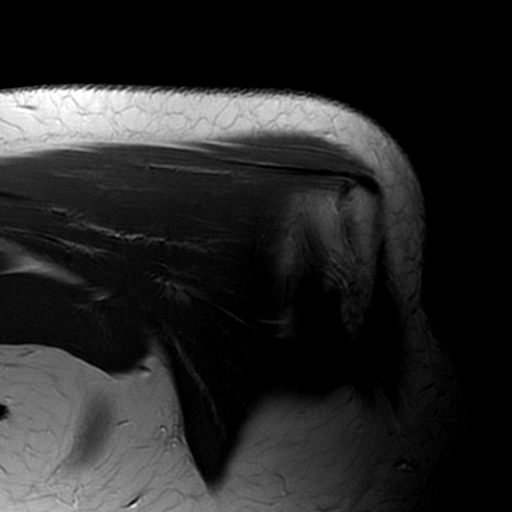
[im 24/24]
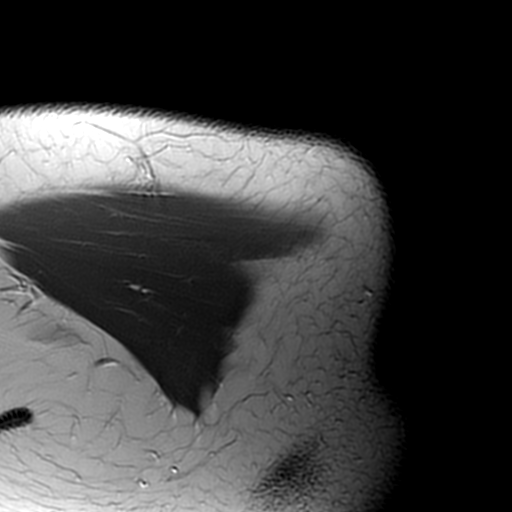

[Series 9: T2 fat-sat · oblique · 3.0mm · 0.27mm/px · 3 of 24 slices shown (3 of 3)]
[im 4/24]
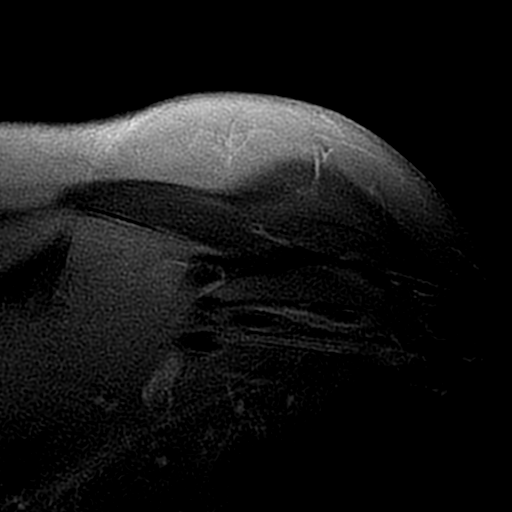
[im 14/24]
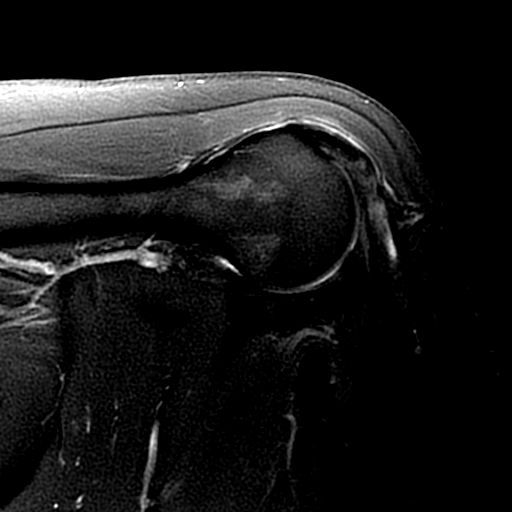
[im 20/24]
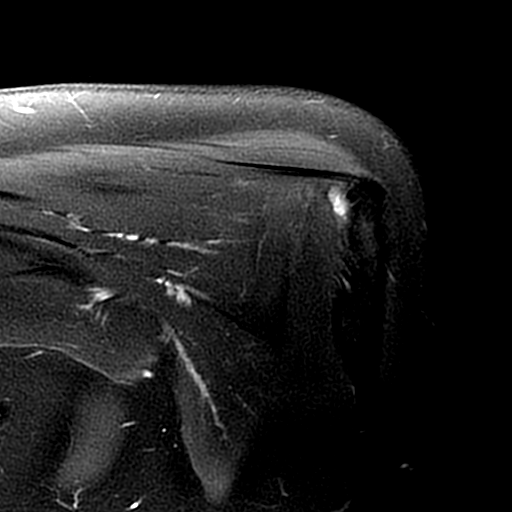

[19 of 40 positions shown; findings below may reference images not displayed]

FINDINGS: Rotator cuff: Severe tendinosis of the supraspinatus tendon with a
high-grade partial-thickness bursal surface tear with a possible
small full-thickness component. Moderate tendinosis of the
infraspinatus tendon. Teres minor tendon is intact. Mild tendinosis
of the subscapularis tendon.

Muscles: No atrophy or fatty replacement of nor abnormal signal
within, the muscles of the rotator cuff.

Biceps long head: Mild tendinosis of the intraarticular portion of
the long head of the biceps tendon.

Acromioclavicular Joint: Normal acromioclavicular joint. Type II
acromion. Small amount of subacromial subdeltoid bursal fluid.

Glenohumeral Joint: No joint effusion. No chondral defect.

Labrum: Grossly intact, but evaluation is limited by lack of
intraarticular fluid.

Bones: No focal marrow signal abnormality. No fracture dislocation.

Other: No fluid collection or hematoma.
IMPRESSION: 1. Severe tendinosis of the supraspinatus tendon with a high-grade
partial-thickness bursal surface tear with a possible small
full-thickness component.
2. Moderate tendinosis of the infraspinatus tendon.
3. Mild tendinosis of the subscapularis tendon.
4. Mild tendinosis of the intraarticular portion of the long head of
the biceps tendon.
5. Mild subacromial/subdeltoid bursitis.

## 2017-11-11 ENCOUNTER — Other Ambulatory Visit: Payer: Self-pay

## 2017-11-11 ENCOUNTER — Encounter: Payer: Self-pay | Admitting: Physical Therapy

## 2017-11-11 ENCOUNTER — Ambulatory Visit: Payer: 59 | Attending: Orthopedic Surgery | Admitting: Physical Therapy

## 2017-11-11 DIAGNOSIS — M62838 Other muscle spasm: Secondary | ICD-10-CM | POA: Diagnosis not present

## 2017-11-11 DIAGNOSIS — M25511 Pain in right shoulder: Secondary | ICD-10-CM | POA: Insufficient documentation

## 2017-11-11 DIAGNOSIS — M6281 Muscle weakness (generalized): Secondary | ICD-10-CM | POA: Diagnosis not present

## 2017-11-11 DIAGNOSIS — M25611 Stiffness of right shoulder, not elsewhere classified: Secondary | ICD-10-CM | POA: Diagnosis not present

## 2017-11-12 NOTE — Therapy (Signed)
[facility] [address], [place], [address] Phone: [phone]   Fax:  [phone]  Physical Therapy Evaluation  Patient Details  Name: Bridget [name] MRN: [id number] Date of Birth: 1956/05/15 Referring Provider: Thornton Park MD   Encounter Date: 11/11/2017  PT End of Session - 11/11/17 1612    Visit Number  1    Number of Visits  16    Date for PT Re-Evaluation  01/06/18    PT Start Time  3220    PT Stop Time  1620    PT Time Calculation (min)  65 min    Activity Tolerance  Patient tolerated treatment well;Patient limited by pain    Behavior During Therapy  [facility] for tasks assessed/performed       Past Medical History:  Diagnosis Date  . 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 Cancer (HCC)    BASAL CELL  . Chronic back pain   . GERD (gastroesophageal reflux disease)    OCC    Past Surgical History:  Procedure Laterality Date  . ANKLE SURGERY Right   . CESAREAN SECTION  [phone]   hodp pre term labor CS  . OVARIAN CYST SURGERY  1973  . SHOULDER ARTHROSCOPY Right 10/31/2017   Procedure: ARTHROSCOPY SHOULDER WITH LYSIS OF ADHESION, SUBACROMINAL DECOMPRESSION,DISTAL CLAVICLE EXCISION.;  Surgeon: Thornton Park, MD;  Location: [facility] ORS;  Service: Orthopedics;  Laterality: Right;  SHOULDER ARTHROSCOPY WITH LYSIS AND RESECTION OF ADHESIONS  . SHOULDER ARTHROSCOPY WITH OPEN ROTATOR CUFF REPAIR AND DISTAL CLAVICLE ACROMINECTOMY Right 03/19/2017   Procedure: SHOULDER ARTHROSCOPY WITH OPEN ROTATOR CUFF REPAIR AND SUBACROMINAL DECOMPRESSION;  Surgeon: Thornton Park, MD;  Location: [facility] ORS;  Service: Orthopedics;  Laterality: Right;  . TUBAL LIGATION  1999  . WRIST GANGLION EXCISION      There were no vitals filed for this visit.   Subjective Assessment - 11/11/17 1540    Subjective  Patient reports having shoulder surgery 10/31/2017 and is currently OOW until 11/18/17. She is having  soreness right shoulder upper trapezius and front of right shoulder. She is able to flex ellbow without help.    Pertinent History  May 2017 lifted a case of water and felt a pain in right shoulder. She had PT prior to surgery without lasting results and then had MRI and underwent RTC repair 03/19/2017. She also has back pain.     Limitations  Lifting;House hold activities;Other (comment) work related activities, overhead motion    Patient Stated Goals  return to full functional use right UE to return to prior level of function    Currently in Pain?  Yes    Pain Score  3     Pain Location  Shoulder    Pain Orientation  Right    Pain Descriptors / Indicators  Aching    Pain Type  Surgical pain 10/31/17    Pain Onset  1 to 4 weeks ago    Pain Frequency  Constant         [facility] PT Assessment - 11/11/17 1545      Assessment   Medical Diagnosis  right shoulder surgery, biceps tenodesis    Referring Provider  Thornton Park MD    Onset Date/Surgical Date  10/31/17    Hand Dominance  Right    Prior Therapy  yes previous surgery right shoulder       Precautions   Precautions  Shoulder    Type of  Shoulder Precautions  s/p biceps tenodesis      Balance Screen   Has the patient fallen in the past 6 months  No      [place] residence    Living Arrangements  Spouse/significant other;Children      Prior Function   Level of Independence  Independent    Vocation  Full time employment    Publishing rights manager, Estate manager/land agent    Leisure  gardening, tennis      Cognition   Overall Cognitive Status  Within Functional Limits for tasks assessed      Observation/Other Assessments   Quick DASH   80% impaired      Posture/Postural Control   Posture Comments  guarded posture with right UE held at side       ROM / Strength   AROM / PROM / Strength  PROM      PROM   Overall PROM Comments  right shoulder 90 degrees elevation, rotation  limited to neutral forearm WNL supination, pronation       Palpation   Palpation comment  cervical spine with spasm right, right upper trapezius with increased tension, spasms             Objective measurements completed on examination: See above findings.   Treatment:    Modalities: Electrical stimulation: 20 min. Russian stim. 10/10 cycle applied (2) electrodes to right shoulder periscapular muscles with patient seated with UE supported on pillow; pt. Performing  goal muscle re education High volt estim.clincial program for muscle spasms  (2) electrodes applied to right shoulder upper trapezius, posterior aspect right shoulder, intensity to tolerance with patient seated in chair with UE supported on pillow goal: pain, spasms  No adverse reactions noted with modalities        PT Education - 11/11/17 1610    Education provided  Yes    Education Details  POC; goals for rehab., precautions; HEP including passive ROM, pendulums    Person(s) Educated  Patient    Methods  Explanation;Demonstration;Verbal cues    Comprehension  Verbalized understanding;Returned demonstration;Verbal cues required          PT Long Term Goals - 11/11/17 1700      PT LONG TERM GOAL #1   Title  Patient will demonstrate improved functional use right UE with decreased pain with QuickDash score of 50% or less     Baseline  QuickDash 80%    Status  New    Target Date  12/09/17      PT LONG TERM GOAL #2   Title  Patient will be independent with home program for pain control, flexibiltiy and strength to allow self management following discharge from physical therapy     Baseline  limited knowlege of appropriate pain control strategies, exercises and progression without moderate assistance    Status  Revised    Target Date  01/06/18      PT LONG TERM GOAL #3   Title  Patient will be able to perform household work/ chores without increase in symptoms.    Baseline  unable to perform household  chores due to recent surgery    Status  New    Target Date  01/06/18      PT LONG TERM GOAL #4   Title  Patient will demonstrate improved functional use right UE with decreased pain to mild with QuickDash score of 20% or less     Baseline  80%  Status  New    Target Date  01/06/18             Plan - 11/11/17 1612    Clinical Impression Statement  Patient is a 61 year old right hand dominant female who presents s/p right shoulder surgery with biceps tenodesis. She is limited in personal care, lifting, daily tasks due to precautions. She has quick Dash score of 80% indicating severe self perceived disability. She has limited knowledge of appropriate exercises, pain control strategies and will require physical therapy intervention to achieve maximal function with minimal to no impairment.     History and Personal Factors relevant to plan of care:  s/p right shoulder surgery, biceps tenodesis    Clinical Presentation  Evolving    Clinical Presentation due to:  recent surgery    Clinical Decision Making  Low    Rehab Potential  Good    Clinical Impairments Affecting Rehab Potential  (+)acute condition, motivated, prior level of function (-) chronic condition prior to surgery    PT Frequency  2x / week    PT Duration  8 weeks    PT Treatment/Interventions  Patient/family education;Electrical Stimulation;Cryotherapy;Moist Heat;Neuromuscular re-education;Therapeutic exercise;Manual techniques;Ultrasound    PT Next Visit Plan  passive ROM elbow, shoulder exercise per protocol, periscapular muscle re education    PT Home Exercise Plan  scapular retraction, pendulums, upper trapezius stretches    Consulted and Agree with Plan of Care  Patient       Patient will benefit from skilled therapeutic intervention in order to improve the following deficits and impairments:  Decreased strength, Pain, Impaired perceived functional ability, Decreased activity tolerance, Decreased endurance, Increased  muscle spasms, Impaired UE functional use, Decreased range of motion  Visit Diagnosis: Acute pain of right shoulder - Plan: PT plan of care cert/re-cert  Other muscle spasm - Plan: PT plan of care cert/re-cert  Muscle weakness (generalized) - Plan: PT plan of care cert/re-cert  Stiffness of right shoulder, not elsewhere classified - Plan: PT plan of care cert/re-cert     Problem List Patient Active Problem List   Diagnosis Date Noted  . Pre-operative examination 02/21/2017  . Low back pain 06/11/2016  . Osteopenia 09/30/2013  . Encounter for routine gynecological examination 01/18/2012  . Routine general medical examination at a health care facility 01/09/2012  . [place], [place] 02/06/2010  . DEGENERATIVE JOINT DISEASE, MILD 07/19/2009    Jomarie Longs PT 11/12/2017, 7:17 PM  [facility] [address], [place], [address] Phone: [phone]   Fax:  [phone]  Name: BURNADETTE BASKETT MRN: [id number] Date of Birth: 1956-10-05

## 2017-11-13 ENCOUNTER — Encounter: Payer: Self-pay | Admitting: Physical Therapy

## 2017-11-13 ENCOUNTER — Ambulatory Visit: Payer: 59 | Admitting: Physical Therapy

## 2017-11-13 DIAGNOSIS — M25611 Stiffness of right shoulder, not elsewhere classified: Secondary | ICD-10-CM | POA: Diagnosis not present

## 2017-11-13 DIAGNOSIS — M6281 Muscle weakness (generalized): Secondary | ICD-10-CM

## 2017-11-13 DIAGNOSIS — M62838 Other muscle spasm: Secondary | ICD-10-CM | POA: Diagnosis not present

## 2017-11-13 DIAGNOSIS — M25511 Pain in right shoulder: Secondary | ICD-10-CM | POA: Diagnosis not present

## 2017-11-13 NOTE — Therapy (Signed)
[facility] [address], [place], [address] Phone: [phone]   Fax:  [phone]  Physical Therapy Treatment  Patient Details  Name: Bridget [name] MRN: [id number] Date of Birth: 08-01-1956 Referring Provider: Thornton Park MD   Encounter Date: 11/13/2017  PT End of Session - 11/13/17 1201    Visit Number  2    Number of Visits  16    Date for PT Re-Evaluation  01/06/18    PT Start Time  1120    PT Stop Time  1213    PT Time Calculation (min)  53 min    Activity Tolerance  Patient tolerated treatment well;Patient limited by pain    Behavior During Therapy  [facility] for tasks assessed/performed       Past Medical History:  Diagnosis Date  . 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 Cancer (HCC)    BASAL CELL  . Chronic back pain   . GERD (gastroesophageal reflux disease)    OCC    Past Surgical History:  Procedure Laterality Date  . ANKLE SURGERY Right   . CESAREAN SECTION  [phone]   hodp pre term labor CS  . OVARIAN CYST SURGERY  1973  . SHOULDER ARTHROSCOPY Right 10/31/2017   Procedure: ARTHROSCOPY SHOULDER WITH LYSIS OF ADHESION, SUBACROMINAL DECOMPRESSION,DISTAL CLAVICLE EXCISION.;  Surgeon: Thornton Park, MD;  Location: [facility]S;  Service: Orthopedics;  Laterality: Right;  SHOULDER ARTHROSCOPY WITH LYSIS AND RESECTION OF ADHESIONS  . SHOULDER ARTHROSCOPY WITH OPEN ROTATOR CUFF REPAIR AND DISTAL CLAVICLE ACROMINECTOMY Right 03/19/2017   Procedure: SHOULDER ARTHROSCOPY WITH OPEN ROTATOR CUFF REPAIR AND SUBACROMINAL DECOMPRESSION;  Surgeon: Thornton Park, MD;  Location: [facility];  Service: Orthopedics;  Laterality: Right;  . TUBAL LIGATION  1999  . WRIST GANGLION EXCISION      There were no vitals filed for this visit.  Subjective Assessment - 11/13/17 1129    Subjective  Patient reports she is sore and she was improved following last visit with estim.    Pertinent History  May  2017 lifted a case of water and felt a pain in right shoulder. She had PT prior to surgery without lasting results and then had MRI and underwent RTC repair 03/19/2017. She also has back pain.     Limitations  Lifting;House hold activities;Other (comment) work related activities, overhead motion    Patient Stated Goals  return to full functional use right UE to return to prior level of function    Currently in Pain?  Yes    Pain Score  5     Pain Location  Shoulder    Pain Orientation  Right    Pain Descriptors / Indicators  Aching    Pain Type  Surgical pain 10/31/2017    Pain Onset  1 to 4 weeks ago    Pain Frequency  Constant       Objective:  PROM right shoulder 95 degrees elevation with repetition, rotation to 20 degrees ER  Palpation: mild spasm and tenderness in right upper trapezius   Treatment:  Modalities: Electrical stimulation: 20 min.: [place] stim. 10/10 cycle applied (2) electrodes to right shoulder periscapular muscles (rhomboids, lower trapezius) with patient seated in chair with right UE supported on pillow goal muscle re education Ultrasound 3MHZ pulsted static 0.8w/cm2 performed x 3 min. Over trigger point spasm right upper trapezius with patient seated prior to exercise: goal: pain, spasm  Manual therapy: 8 min. Patient seated  in chair: STM performed with superficial and compression techniques along cervical spine paraspinal muscles and right upper trapezius muscle: goal: spasms, improved soft tissue elasticity  Therapeutic exercise: patient performed with assistance of therapist with verbal and tactile cues: goals: improve Quick Dash, ROM Patient supine lying with right UE supported on towels to avoid extension of shoulder and decrease strain on repair PROM performed to right UE elbow flexion/extension 2 x 10 PROM right shoulder forward elevation 2 x 10 with slow controlled motion and monitoring patient for pain  PROM right shoulder ER through partial ROM  0-20 with  shoulder neutral 1 x 10 Forearm supination/pronation x 10    Patient response to treatment: Patient demonstrated improved ROM with less pain with repetition and following US/ STM today. Improved scapular retraction and control with estim.       PT Education - 11/13/17 1201    Education provided  Yes    Education Details  re assessed home program    Person(s) Educated  Patient    Methods  Explanation    Comprehension  Verbalized understanding          PT Long Term Goals - 11/11/17 1700      PT LONG TERM GOAL #1   Title  Patient will demonstrate improved functional use right UE with decreased pain with QuickDash score of 50% or less     Baseline  QuickDash 80%    Status  New    Target Date  12/09/17      PT LONG TERM GOAL #2   Title  Patient will be independent with home program for pain control, flexibiltiy and strength to allow self management following discharge from physical therapy     Baseline  limited knowlege of appropriate pain control strategies, exercises and progression without moderate assistance    Status  Revised    Target Date  01/06/18      PT LONG TERM GOAL #3   Title  Patient will be able to perform household work/ chores without increase in symptoms.    Baseline  unable to perform household chores due to recent surgery    Status  New    Target Date  01/06/18      PT LONG TERM GOAL #4   Title  Patient will demonstrate improved functional use right UE with decreased pain to mild with QuickDash score of 20% or less     Baseline  80%    Status  New    Target Date  01/06/18            Plan - 11/13/17 1217    Clinical Impression Statement  Patient demonstrated decreased spasms and tenderness with US/estim. and STM and improved ability to perform PROM with right shoulder to >90 degrees with no pain reported until end range of motion. She is progressing steadily towards goals  and should continue to progress with additional physical therapy  intervention.     Rehab Potential  Good    Clinical Impairments Affecting Rehab Potential  (+)acute condition, motivated, prior level of function (-) chronic condition prior to surgery    PT Frequency  2x / week    PT Duration  8 weeks    PT Treatment/Interventions  Patient/family education;Electrical Stimulation;Cryotherapy;Moist Heat;Neuromuscular re-education;Therapeutic exercise;Manual techniques;Ultrasound    PT Next Visit Plan  passive ROM elbow, shoulder exercise per protocol, periscapular muscle re education    PT Home Exercise Plan  scapular retraction, pendulums, upper trapezius stretches  Patient will benefit from skilled therapeutic intervention in order to improve the following deficits and impairments:  Decreased strength, Pain, Impaired perceived functional ability, Decreased activity tolerance, Decreased endurance, Increased muscle spasms, Impaired UE functional use, Decreased range of motion  Visit Diagnosis: Acute pain of right shoulder  Other muscle spasm  Muscle weakness (generalized)  Stiffness of right shoulder, not elsewhere classified     Problem List Patient Active Problem List   Diagnosis Date Noted  . Pre-operative examination 02/21/2017  . Low back pain 06/11/2016  . Osteopenia 09/30/2013  . Encounter for routine gynecological examination 01/18/2012  . Routine general medical examination at a health care facility 01/09/2012  . [place], [place] 02/06/2010  . DEGENERATIVE JOINT DISEASE, MILD 07/19/2009    Jomarie Longs PT 11/13/2017, 12:21 PM  [facility] [address] [place], [address] Phone: [phone]   Fax:  [phone]  Name: Bridget [name] MRN: [id number] Date of Birth: September 13, 1956

## 2017-11-20 ENCOUNTER — Other Ambulatory Visit: Payer: Self-pay

## 2017-11-20 ENCOUNTER — Encounter: Payer: Self-pay | Admitting: Physical Therapy

## 2017-11-20 ENCOUNTER — Ambulatory Visit: Payer: 59 | Admitting: Physical Therapy

## 2017-11-20 DIAGNOSIS — M25611 Stiffness of right shoulder, not elsewhere classified: Secondary | ICD-10-CM

## 2017-11-20 DIAGNOSIS — M6281 Muscle weakness (generalized): Secondary | ICD-10-CM | POA: Diagnosis not present

## 2017-11-20 DIAGNOSIS — M25511 Pain in right shoulder: Secondary | ICD-10-CM | POA: Diagnosis not present

## 2017-11-20 DIAGNOSIS — M62838 Other muscle spasm: Secondary | ICD-10-CM

## 2017-11-20 NOTE — Therapy (Signed)
[facility] [address], [place], [address] Phone: [phone]   Fax:  [phone]  Physical Therapy Treatment  Patient Details  Name: Bridget [name] MRN: [id number] Date of Birth: 1956/12/20 Referring Provider: Thornton Park MD   Encounter Date: 11/20/2017  PT End of Session - 11/20/17 1104    Visit Number  3    Number of Visits  16    Date for PT Re-Evaluation  01/06/18    PT Start Time  1102    PT Stop Time  1141    PT Time Calculation (min)  39 min    Activity Tolerance  Patient tolerated treatment well;Patient limited by pain    Behavior During Therapy  [facility] for tasks assessed/performed       Past Medical History:  Diagnosis Date  . 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 Cancer (HCC)    BASAL CELL  . Chronic back pain   . GERD (gastroesophageal reflux disease)    OCC    Past Surgical History:  Procedure Laterality Date  . ANKLE SURGERY Right   . CESAREAN SECTION  [phone]   hodp pre term labor CS  . OVARIAN CYST SURGERY  1973  . SHOULDER ARTHROSCOPY Right 10/31/2017   Procedure: ARTHROSCOPY SHOULDER WITH LYSIS OF ADHESION, SUBACROMINAL DECOMPRESSION,DISTAL CLAVICLE EXCISION.;  Surgeon: Thornton Park, MD;  Location: [facility];  Service: Orthopedics;  Laterality: Right;  SHOULDER ARTHROSCOPY WITH LYSIS AND RESECTION OF ADHESIONS  . SHOULDER ARTHROSCOPY WITH OPEN ROTATOR CUFF REPAIR AND DISTAL CLAVICLE ACROMINECTOMY Right 03/19/2017   Procedure: SHOULDER ARTHROSCOPY WITH OPEN ROTATOR CUFF REPAIR AND SUBACROMINAL DECOMPRESSION;  Surgeon: Thornton Park, MD;  Location: [facility];  Service: Orthopedics;  Laterality: Right;  . TUBAL LIGATION  1999  . WRIST GANGLION EXCISION      There were no vitals filed for this visit.  Subjective Assessment - 11/20/17 1105    Subjective  Pt reports her discomfort level has not improved since last session.  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      Pertinent History  May 2017 lifted a case of water and felt a pain in right shoulder. She had PT prior to surgery without lasting results and then had MRI and underwent RTC repair 03/19/2017. She also has back pain.     Limitations  Lifting;House hold activities;Other (comment) work related activities, overhead motion    Patient Stated Goals  return to full functional use right UE to return to prior level of function    Currently in Pain?  Yes    Pain Score  4     Pain Location  Shoulder    Pain Orientation  Right    Pain Descriptors / Indicators  Aching    Pain Type  Surgical pain    Pain Onset  1 to 4 weeks ago    Multiple Pain Sites  No        TREATMENT   Manual therapy:  Supine: STM performed with superficial and compression techniques along cervical spine paraspinal muscles, right upper trapezius muscle, R deltoid: goal: spasms, improved soft tissue elasticity    Modalities:  Electrical stimulation:?12 min.:?[place] stim. 10/10 cycle, 59mA applied (2) electrodes to?right shoulder periscapular muscles (rhomboids, lower trapezius) with?patient?seated in chair with right UE supported on pillow goal  muscle re education    Therapeutic Exercise:  Patient supine lying with right UE supported on towels to avoid extension of shoulder and decrease strain on repair  PROM performed to right UE elbow flexion/extension 2 x 10  PROM right shoulder forward elevation 2 x 10 with slow controlled motion and monitoring patient for pain  PROM right shoulder ER through partial ROM ?0-20 with shoulder neutral 2 x 10  Forearm supination/pronation 2x 10?         PT Education - 11/20/17 1103    Education provided  Yes    Education Details  Reasoning behind interventions    Person(s) Educated  Patient    Methods  Explanation;Demonstration;Verbal cues     Comprehension  Verbalized understanding;Need further instruction          PT Long Term Goals - 11/11/17 1700      PT LONG TERM GOAL #1   Title  Patient will demonstrate improved functional use right UE with decreased pain with QuickDash score of 50% or less     Baseline  QuickDash 80%    Status  New    Target Date  12/09/17      PT LONG TERM GOAL #2   Title  Patient will be independent with home program for pain control, flexibiltiy and strength to allow self management following discharge from physical therapy     Baseline  limited knowlege of appropriate pain control strategies, exercises and progression without moderate assistance    Status  Revised    Target Date  01/06/18      PT LONG TERM GOAL #3   Title  Patient will be able to perform household work/ chores without increase in symptoms.    Baseline  unable to perform household chores due to recent surgery    Status  New    Target Date  01/06/18      PT LONG TERM GOAL #4   Title  Patient will demonstrate improved functional use right UE with decreased pain to mild with QuickDash score of 20% or less     Baseline  80%    Status  New    Target Date  01/06/18            Plan - 11/20/17 1138    Clinical Impression Statement  Pt reported soreness in R upper trap, R deltoid, and R cervical paraspinals which improved with STM.  She responded well with PROM exercises and e-stim.  Pt will continue to benefit from skilled PT Interventions for improved posture, ROM, and eventual return to functional use of RUE.     Rehab Potential  Good    Clinical Impairments Affecting Rehab Potential  (+)acute condition, motivated, prior level of function (-) chronic condition prior to surgery    PT Frequency  2x / week    PT Duration  8 weeks    PT Treatment/Interventions  Patient/family education;Electrical Stimulation;Cryotherapy;Moist Heat;Neuromuscular re-education;Therapeutic exercise;Manual techniques;Ultrasound    PT Next Visit  Plan  passive ROM elbow, shoulder exercise per protocol, periscapular muscle re education    PT Home Exercise Plan  scapular retraction, pendulums, upper trapezius stretches       Patient will benefit from skilled therapeutic intervention in order to improve the following deficits and impairments:  Decreased strength, Pain, Impaired perceived functional ability, Decreased activity tolerance, Decreased endurance, Increased muscle spasms, Impaired UE functional use, Decreased range of motion  Visit Diagnosis: Acute pain of right shoulder  Other muscle spasm  Muscle weakness (  generalized)  Stiffness of right shoulder, not elsewhere classified     Problem List Patient Active Problem List   Diagnosis Date Noted  . Pre-operative examination 02/21/2017  . Low back pain 06/11/2016  . Osteopenia 09/30/2013  . Encounter for routine gynecological examination 01/18/2012  . Routine general medical examination at a health care facility 01/09/2012  . [place], [place] 02/06/2010  . DEGENERATIVE JOINT DISEASE, MILD 07/19/2009    Collie Siad PT, DPT 11/20/2017, 11:41 AM  [facility] [address] [place], [address] Phone: [phone]   Fax:  [phone]  Name: Bridget [name] MRN: [id number] Date of Birth: November 21, 1956

## 2017-11-25 ENCOUNTER — Ambulatory Visit: Payer: 59 | Attending: Orthopedic Surgery | Admitting: Physical Therapy

## 2017-11-25 ENCOUNTER — Encounter: Payer: Self-pay | Admitting: Physical Therapy

## 2017-11-25 DIAGNOSIS — M62838 Other muscle spasm: Secondary | ICD-10-CM | POA: Diagnosis not present

## 2017-11-25 DIAGNOSIS — M6281 Muscle weakness (generalized): Secondary | ICD-10-CM | POA: Diagnosis not present

## 2017-11-25 DIAGNOSIS — M25511 Pain in right shoulder: Secondary | ICD-10-CM

## 2017-11-25 DIAGNOSIS — M25611 Stiffness of right shoulder, not elsewhere classified: Secondary | ICD-10-CM

## 2017-11-25 NOTE — Therapy (Signed)
[facility] [address], [place], [address] Phone: [phone]   Fax:  [phone]  Physical Therapy Treatment  Patient Details  Name: Bridget [name] MRN: [id number] Date of Birth: 12-07-1956 Referring Provider: Thornton Park MD   Encounter Date: 11/25/2017  PT End of Session - 11/25/17 1851    Visit Number  4    Number of Visits  16    Date for PT Re-Evaluation  01/06/18    PT Start Time  1700    PT Stop Time  1745    PT Time Calculation (min)  45 min    Activity Tolerance  Patient tolerated treatment well;Patient limited by pain    Behavior During Therapy  [facility] for tasks assessed/performed       Past Medical History:  Diagnosis Date  . 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 Cancer (HCC)    BASAL CELL  . Chronic back pain   . GERD (gastroesophageal reflux disease)    OCC    Past Surgical History:  Procedure Laterality Date  . ANKLE SURGERY Right   . CESAREAN SECTION  [phone]   hodp pre term labor CS  . OVARIAN CYST SURGERY  1973  . SHOULDER ARTHROSCOPY Right 10/31/2017   Procedure: ARTHROSCOPY SHOULDER WITH LYSIS OF ADHESION, SUBACROMINAL DECOMPRESSION,DISTAL CLAVICLE EXCISION.;  Surgeon: Thornton Park, MD;  Location: [facility];  Service: Orthopedics;  Laterality: Right;  SHOULDER ARTHROSCOPY WITH LYSIS AND RESECTION OF ADHESIONS  . SHOULDER ARTHROSCOPY WITH OPEN ROTATOR CUFF REPAIR AND DISTAL CLAVICLE ACROMINECTOMY Right 03/19/2017   Procedure: SHOULDER ARTHROSCOPY WITH OPEN ROTATOR CUFF REPAIR AND SUBACROMINAL DECOMPRESSION;  Surgeon: Thornton Park, MD;  Location: [facility];  Service: Orthopedics;  Laterality: Right;  . TUBAL LIGATION  1999  . WRIST GANGLION EXCISION      There were no vitals filed for this visit.  Subjective Assessment - 11/25/17 1701    Subjective  Patient reports she feels her arm is a little better today than last week. She reports she had increased pain  [place]    Pertinent History  May 2017 lifted a case of water and felt a pain in right shoulder. She had PT prior to surgery without lasting results and then had MRI and underwent RTC repair 03/19/2017. She also has back pain.     Limitations  Lifting;House hold activities;Other (comment) work related activities, overhead motion    Patient Stated Goals  return to full functional use right UE to return to prior level of function    Currently in Pain?  Yes    Pain Score  3     Pain Location  Shoulder    Pain Orientation  Right    Pain Descriptors / Indicators  Aching    Pain Type  Surgical pain 10/31/2017    Pain Onset  1 to 4 weeks ago    Pain Frequency  Constant       Objective:  PROM right shoulder 100 degrees elevation with repetition, rotation to 30 degrees ER  Palpation: mild spasm and tenderness in right upper trapezius   Treatment:  Modalities: Electrical stimulation: 15 min.: Russian stim. 10/10 cycle applied (2) electrodes to right shoulder periscapular muscles (rhomboids, lower trapezius) with patient seated in chair with right UE supported on pillow goal muscle re education  Manual therapy: 10 min. Patient supine lying: STM performed with superficial and compression techniques along  cervical spine paraspinal muscles and right upper trapezius muscle: goal: spasms, improved soft tissue elasticity  Therapeutic exercise: patient performed with assistance of therapist with verbal and tactile cues: goals: improve Quick Dash, ROM Patient supine lying with right UE supported on towels to avoid extension of shoulder and decrease strain on repair PROM performed to right UE elbow flexion/extension 2 x 10 PROM right shoulder forward elevation 2 x 10 with slow controlled motion and monitoring patient for pain  PROM right shoulder ER through partial ROM  0-30 with shoulder neutral 1 x 10    Patient response to treatment: Patient  required moderate assistance to perform exercises with VC for relaxation and correct alignment. Improved soft tissue elasticity with STM to cervical spine, upper trapezius muscles.        PT Education - 11/25/17 1713    Education provided  Yes    Education Details  progress, protocol, re assessed precautions    Person(s) Educated  Patient    Methods  Explanation    Comprehension  Verbalized understanding          PT Long Term Goals - 11/11/17 1700      PT LONG TERM GOAL #1   Title  Patient will demonstrate improved functional use right UE with decreased pain with QuickDash score of 50% or less     Baseline  QuickDash 80%    Status  New    Target Date  12/09/17      PT LONG TERM GOAL #2   Title  Patient will be independent with home program for pain control, flexibiltiy and strength to allow self management following discharge from physical therapy     Baseline  limited knowlege of appropriate pain control strategies, exercises and progression without moderate assistance    Status  Revised    Target Date  01/06/18      PT LONG TERM GOAL #3   Title  Patient will be able to perform household work/ chores without increase in symptoms.    Baseline  unable to perform household chores due to recent surgery    Status  New    Target Date  01/06/18      PT LONG TERM GOAL #4   Title  Patient will demonstrate improved functional use right UE with decreased pain to mild with QuickDash score of 20% or less     Baseline  80%    Status  New    Target Date  01/06/18            Plan - 11/25/17 1800    Clinical Impression Statement  Patient demonstrated improvement with soft tissue elasticity, improved motor control right shoulder with treatment. She is progressing steadily towards goals and should conitnue to progress with guidance and assistance of physical therapy interventions.     Rehab Potential  Good    Clinical Impairments Affecting Rehab Potential  (+)acute condition,  motivated, prior level of function (-) chronic condition prior to surgery    PT Frequency  2x / week    PT Duration  8 weeks    PT Treatment/Interventions  Patient/family education;Electrical Stimulation;Cryotherapy;Moist Heat;Neuromuscular re-education;Therapeutic exercise;Manual techniques;Ultrasound    PT Next Visit Plan  passive ROM elbow, shoulder exercise per protocol, periscapular muscle re education    PT Home Exercise Plan  scapular retraction, pendulums, upper trapezius stretches       Patient will benefit from skilled therapeutic intervention in order to improve the following deficits and impairments:  Decreased strength, Pain,  Impaired perceived functional ability, Decreased activity tolerance, Decreased endurance, Increased muscle spasms, Impaired UE functional use, Decreased range of motion  Visit Diagnosis: Acute pain of right shoulder  Other muscle spasm  Muscle weakness (generalized)  Stiffness of right shoulder, not elsewhere classified     Problem List Patient Active Problem List   Diagnosis Date Noted  . Pre-operative examination 02/21/2017  . Low back pain 06/11/2016  . Osteopenia 09/30/2013  . Encounter for routine gynecological examination 01/18/2012  . Routine general medical examination at a health care facility 01/09/2012  . [place], [place] 02/06/2010  . DEGENERATIVE JOINT DISEASE, MILD 07/19/2009    Jomarie Longs PT 11/26/2017, 5:37 PM  [facility] [address], [place], [address] Phone: [phone]   Fax:  [phone]  Name: Bridget [name] MRN: [id number] Date of Birth: 08-24-1956

## 2017-11-27 ENCOUNTER — Encounter: Payer: Self-pay | Admitting: Physical Therapy

## 2017-11-27 ENCOUNTER — Ambulatory Visit: Payer: 59 | Admitting: Physical Therapy

## 2017-11-27 DIAGNOSIS — M6281 Muscle weakness (generalized): Secondary | ICD-10-CM | POA: Diagnosis not present

## 2017-11-27 DIAGNOSIS — M25511 Pain in right shoulder: Secondary | ICD-10-CM | POA: Diagnosis not present

## 2017-11-27 DIAGNOSIS — M62838 Other muscle spasm: Secondary | ICD-10-CM

## 2017-11-27 DIAGNOSIS — M25611 Stiffness of right shoulder, not elsewhere classified: Secondary | ICD-10-CM | POA: Diagnosis not present

## 2017-11-27 NOTE — Therapy (Signed)
[place] [facility] [address] [place], [address] Phone: [phone]   Fax:  [phone]  Physical Therapy Treatment  Patient Details  Name: Bridget [name] MRN: [id number] Date of Birth: 10-Jan-1956 Referring Provider: Thornton Park MD   Encounter Date: 11/27/2017  PT End of Session - 11/27/17 1418    Visit Number  5    Number of Visits  16    Date for PT Re-Evaluation  01/06/18    PT Start Time  1152    PT Stop Time  1238    PT Time Calculation (min)  46 min    Activity Tolerance  Patient tolerated treatment well;Patient limited by pain    Behavior During Therapy  [facility] for tasks assessed/performed       Past Medical History:  Diagnosis Date  . 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 Cancer (HCC)    BASAL CELL  . Chronic back pain   . GERD (gastroesophageal reflux disease)    OCC    Past Surgical History:  Procedure Laterality Date  . ANKLE SURGERY Right   . CESAREAN SECTION  [phone]   hodp pre term labor CS  . OVARIAN CYST SURGERY  1973  . SHOULDER ARTHROSCOPY Right 10/31/2017   Procedure: ARTHROSCOPY SHOULDER WITH LYSIS OF ADHESION, SUBACROMINAL DECOMPRESSION,DISTAL CLAVICLE EXCISION.;  Surgeon: Thornton Park, MD;  Location: [facility] ORS;  Service: Orthopedics;  Laterality: Right;  SHOULDER ARTHROSCOPY WITH LYSIS AND RESECTION OF ADHESIONS  . SHOULDER ARTHROSCOPY WITH OPEN ROTATOR CUFF REPAIR AND DISTAL CLAVICLE ACROMINECTOMY Right 03/19/2017   Procedure: SHOULDER ARTHROSCOPY WITH OPEN ROTATOR CUFF REPAIR AND SUBACROMINAL DECOMPRESSION;  Surgeon: Thornton Park, MD;  Location: [facility]S;  Service: Orthopedics;  Laterality: Right;  . TUBAL LIGATION  1999  . WRIST GANGLION EXCISION      There were no vitals filed for this visit.  Subjective Assessment - 11/27/17 1416    Subjective  Patient reports she feels her arm is a little better today than last week. She reports she had increased pain  [place]    Pertinent History  May 2017 lifted a case of water and felt a pain in right shoulder. She had PT prior to surgery without lasting results and then had MRI and underwent RTC repair 03/19/2017. She also has back pain.     Limitations  Lifting;House hold activities;Other (comment) work related activities, overhead motion    Patient Stated Goals  return to full functional use right UE to return to prior level of function    Currently in Pain?  Yes    Pain Score  2     Pain Location  Shoulder    Pain Orientation  Right    Pain Descriptors / Indicators  Aching    Pain Type  Surgical pain 10/31/17    Pain Onset  1 to 4 weeks ago    Pain Frequency  Constant         Objective:  PROM right shoulder 120 degrees elevation with repetition, rotation to 40 degrees ER  Palpation: mild spasm and tenderness in right upper trapezius and along medial border right scapula   Treatment:  Modalities: Electrical stimulation: 15 min.: Russian stim. 10/10 cycle applied (2) electrodes to right shoulder periscapular muscles (rhomboids, lower trapezius) with patient seated in chair with right UE supported on pillow goal muscle re education; ice pack  applied to right shoulder during  estim.: no adverse reactions noted Ultrasound 3MHZ pulsted static 0.8w/cm2 performed x 3 min. Over trigger point spasm right upper trapezius with patient seated prior to exercise: goal: pain, spasm  Manual therapy: 10 min. Patient seated in chair: STM performed with superficial and compression techniques along cervical spine paraspinal muscles and right upper trapezius muscle; and side lying along medial border of right scapula: goal: spasms, improved soft tissue elasticity  Therapeutic exercise: patient performed with assistance of therapist with verbal and tactile cues: goals: improve Quick Dash, ROM Patient supine lying with right UE supported on towels to avoid extension  of shoulder and decrease strain on repair PROM performed to right UE elbow flexion/extension 2 x 10 PROM right shoulder forward elevation 2 x 10 with slow controlled motion and monitoring patient for pain  PROM right shoulder ER through partial ROM  0-20 with shoulder neutral 1 x 10 Forearm supination/pronation x 10   Side lying scapular control exercises elevation/depression and scapular retraction with depression x 10 each with tactile cues  Patient response to treatment: Patient demonstrated improved soft tissue elasticity with decreased tenderness right upper trapezius with [place]. Improved PROM right shoulder with repetition and VC. Improved scapular retraction and control with estim.         PT Education - 11/27/17 1417    Education provided  Yes    Education Details  reviewed protocol and progression for ROM, strengthening and AROM right shoulder s/p surgery    Person(s) Educated  Patient    Methods  Explanation;Demonstration;Verbal cues    Comprehension  Verbalized understanding          PT Long Term Goals - 11/11/17 1700      PT LONG TERM GOAL #1   Title  Patient will demonstrate improved functional use right UE with decreased pain with QuickDash score of 50% or less     Baseline  QuickDash 80%    Status  New    Target Date  12/09/17      PT LONG TERM GOAL #2   Title  Patient will be independent with home program for pain control, flexibiltiy and strength to allow self management following discharge from physical therapy     Baseline  limited knowlege of appropriate pain control strategies, exercises and progression without moderate assistance    Status  Revised    Target Date  01/06/18      PT LONG TERM GOAL #3   Title  Patient will be able to perform household work/ chores without increase in symptoms.    Baseline  unable to perform household chores due to recent surgery    Status  New    Target Date  01/06/18      PT LONG TERM GOAL #4   Title  Patient will  demonstrate improved functional use right UE with decreased pain to mild with QuickDash score of 20% or less     Baseline  80%    Status  New    Target Date  01/06/18            Plan - 11/27/17 1418    Clinical Impression Statement  Progressing steadily with improving ROM with less pain in right shoulder. spasm reduced with [place] and estim.     Rehab Potential  Good    Clinical Impairments Affecting Rehab Potential  (+)acute condition, motivated, prior level of function (-) chronic condition prior to surgery    PT Frequency  2x / week    PT Duration  8 weeks    PT Treatment/Interventions  Patient/family education;Electrical Stimulation;Cryotherapy;Moist Heat;Neuromuscular re-education;Therapeutic exercise;Manual techniques;Ultrasound    PT Next Visit Plan  passive ROM elbow, shoulder exercise per protocol, periscapular muscle re education    PT Home Exercise Plan  scapular retraction, pendulums, upper trapezius stretches       Patient will benefit from skilled therapeutic intervention in order to improve the following deficits and impairments:  Decreased strength, Pain, Impaired perceived functional ability, Decreased activity tolerance, Decreased endurance, Increased muscle spasms, Impaired UE functional use, Decreased range of motion  Visit Diagnosis: Acute pain of right shoulder  Other muscle spasm  Muscle weakness (generalized)     Problem List Patient Active Problem List   Diagnosis Date Noted  . Pre-operative examination 02/21/2017  . Low back pain 06/11/2016  . Osteopenia 09/30/2013  . Encounter for routine gynecological examination 01/18/2012  . Routine general medical examination at a health care facility 01/09/2012  . [place] 02/06/2010  . DEGENERATIVE JOINT DISEASE, MILD 07/19/2009    Jomarie Longs PT 11/27/2017, 2:20 PM  [facility] [address], [place], [address] Phone:  [phone]   Fax:  [phone]  Name: Bridget [name] MRN: [id number] Date of Birth: [birth year]

## 2017-12-02 ENCOUNTER — Ambulatory Visit: Payer: 59 | Admitting: Physical Therapy

## 2017-12-04 ENCOUNTER — Ambulatory Visit: Payer: 59 | Admitting: Physical Therapy

## 2017-12-04 ENCOUNTER — Encounter: Payer: Self-pay | Admitting: Physical Therapy

## 2017-12-04 DIAGNOSIS — M62838 Other muscle spasm: Secondary | ICD-10-CM | POA: Diagnosis not present

## 2017-12-04 DIAGNOSIS — M6281 Muscle weakness (generalized): Secondary | ICD-10-CM | POA: Diagnosis not present

## 2017-12-04 DIAGNOSIS — M25511 Pain in right shoulder: Secondary | ICD-10-CM | POA: Diagnosis not present

## 2017-12-04 DIAGNOSIS — M25611 Stiffness of right shoulder, not elsewhere classified: Secondary | ICD-10-CM

## 2017-12-04 NOTE — Therapy (Addendum)
[place] [facility] [address] [place], [address] Phone: [phone]   Fax:  [phone]  Physical Therapy Treatment  Patient Details  Name: Bridget [name] MRN: [id number] Date of Birth: 15-Mar-1956 Referring Provider: Thornton Park MD   Encounter Date: 12/04/2017  PT End of Session - 12/04/17 1250    Visit Number  6    Number of Visits  16    Date for PT Re-Evaluation  01/06/18    PT Start Time  0630    PT Stop Time  1243    PT Time Calculation (min)  58 min    Activity Tolerance  Patient tolerated treatment well;Patient limited by pain    Behavior During Therapy  [facility] for tasks assessed/performed       Past Medical History:  Diagnosis Date  . 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 Cancer (HCC)    BASAL CELL  . Chronic back pain   . GERD (gastroesophageal reflux disease)    OCC    Past Surgical History:  Procedure Laterality Date  . ANKLE SURGERY Right   . CESAREAN SECTION  [phone]   hodp pre term labor CS  . OVARIAN CYST SURGERY  1973  . SHOULDER ARTHROSCOPY Right 10/31/2017   Procedure: ARTHROSCOPY SHOULDER WITH LYSIS OF ADHESION, SUBACROMINAL DECOMPRESSION,DISTAL CLAVICLE EXCISION.;  Surgeon: Thornton Park, MD;  Location: [facility];  Service: Orthopedics;  Laterality: Right;  SHOULDER ARTHROSCOPY WITH LYSIS AND RESECTION OF ADHESIONS  . SHOULDER ARTHROSCOPY WITH OPEN ROTATOR CUFF REPAIR AND DISTAL CLAVICLE ACROMINECTOMY Right 03/19/2017   Procedure: SHOULDER ARTHROSCOPY WITH OPEN ROTATOR CUFF REPAIR AND SUBACROMINAL DECOMPRESSION;  Surgeon: Thornton Park, MD;  Location: [facility];  Service: Orthopedics;  Laterality: Right;  . TUBAL LIGATION  1999  . WRIST GANGLION EXCISION      There were no vitals filed for this visit.  Subjective Assessment - 12/04/17 1150    Subjective  Patient reports having follow up with MD and is able to do more with her arm, no heavy lifting and can  progress ROM at tolerated    Pertinent History  May 2017 lifted a case of water and felt a pain in right shoulder. She had PT prior to surgery without lasting results and then had MRI and underwent RTC repair 03/19/2017. She also has back pain.     Limitations  Lifting;House hold activities;Other (comment) work related activities, overhead motion    Patient Stated Goals  return to full functional use right UE to return to prior level of function    Currently in Pain?  Other (Comment) mild discomfort in shoulder, tight in upper traps    Pain Type  Surgical pain 10/31/17    Pain Onset  --       Objective:  PROM right shoulder 150 degrees elevation with repetition, rotation to 45 degrees ER  Palpation: moderate spasm and tenderness in right upper trapezius and along medial border right scapula   Treatment: Modalities: Electrical stimulation:15 min.:Russian stim. 10/10 cycle applied (2) electrodes toright shoulder periscapular muscles (rhomboids, lower trapezius); HV estim applied to spasms right upper trapezius and posterior aspect of shoulder, intensity to tolerance: muscle spasm clinical program;  withpatientseated in chair with right UE supported on pillowgoal muscle re education; moist heat pack  applied to right shoulder during estim.: no adverse reactions noted  Manual therapy: 10 min. Patient side lying left: STM performed with superficial and compression techniques along cervical spine paraspinal  muscles and right upper trapezius muscle, along medial border of right scapula: goal: spasms, improved soft tissue elasticity  Therapeutic exercise: patient performed with assistance of therapist with verbal and tactile cues: goals: improve Quick Dash, ROM Patient supine lying with right UE supported on towels to avoid extension of shoulder and decrease strain on repair AAROM performed to right UE elbow flexion/extension 2 x 10 PROM/AAROM right shoulder forward elevation 2 x 10 with  patient performing independently with guidance for correct alignment of shoulder and stabilization of scapula to avoid pain in upper arm/shoulder  Side lying left:  right shoulder ER 2 x 10 scapular control exercises elevation/depression and scapular retraction with depression 2  x 10 each with tactile cues and manual resistance given to scapular retraction/depression Right shoulder abduction 2 x 10 to 90 degrees with VC and tactile cues ( elbow flexed 90 degrees) for short lever arm  Patient response to treatment:Patient demonstrated improved soft tissue elasticity with decreased tenderness right upper trapezius with STM. Improved AAROM to 150 degrees and able to improve ROM with less shoulder pain/discomfort with focus on scapula stabilization.    PT Education - 12/04/17 1256    Education provided  Yes    Education Details  exercise instruction for AAROM/AROM right shoulder     Person(s) Educated  Patient    Methods  Explanation;Demonstration;Verbal cues;Tactile cues    Comprehension  Verbalized understanding;Returned demonstration;Verbal cues required          PT Long Term Goals - 11/11/17 1700      PT LONG TERM GOAL #1   Title  Patient will demonstrate improved functional use right UE with decreased pain with QuickDash score of 50% or less     Baseline  QuickDash 80%    Status  New    Target Date  12/09/17      PT LONG TERM GOAL #2   Title  Patient will be independent with home program for pain control, flexibiltiy and strength to allow self management following discharge from physical therapy     Baseline  limited knowlege of appropriate pain control strategies, exercises and progression without moderate assistance    Status  Revised    Target Date  01/06/18      PT LONG TERM GOAL #3   Title  Patient will be able to perform household work/ chores without increase in symptoms.    Baseline  unable to perform household chores due to recent surgery    Status  New     Target Date  01/06/18      PT LONG TERM GOAL #4   Title  Patient will demonstrate improved functional use right UE with decreased pain to mild with QuickDash score of 20% or less     Baseline  80%    Status  New    Target Date  01/06/18            Plan - 12/04/17 1251    Clinical Impression Statement  Patient demonstrates steady improvement with increased ROM to 150 elevation, 45 ER and improving scapular control with all exercises with minimal cuing. She should continue to improve with continued physical therapy intervention.     Rehab Potential  Good    Clinical Impairments Affecting Rehab Potential  (+)acute condition, motivated, prior level of function (-) chronic condition prior to surgery    PT Frequency  2x / week    PT Duration  8 weeks    PT Treatment/Interventions  Patient/family education;Electrical Stimulation;Cryotherapy;Moist  Heat;Neuromuscular re-education;Therapeutic exercise;Manual techniques;Ultrasound    PT Next Visit Plan  continue with stabilization, muscle re education, ROM exercises right shoulder     PT Home Exercise Plan  scapular retraction, upper trapezius stretches, supine AAROM and side lying exercises       Patient will benefit from skilled therapeutic intervention in order to improve the following deficits and impairments:  Decreased strength, Pain, Impaired perceived functional ability, Decreased activity tolerance, Decreased endurance, Increased muscle spasms, Impaired UE functional use, Decreased range of motion  Visit Diagnosis: Acute pain of right shoulder  Other muscle spasm  Muscle weakness (generalized)  Stiffness of right shoulder, not elsewhere classified     Problem List Patient Active Problem List   Diagnosis Date Noted  . Pre-operative examination 02/21/2017  . Low back pain 06/11/2016  . Osteopenia 09/30/2013  . Encounter for routine gynecological examination 01/18/2012  . Routine general medical examination at a health care  facility 01/09/2012  . [place], [place] 02/06/2010  . DEGENERATIVE JOINT DISEASE, MILD 07/19/2009    Jomarie Longs PT 12/04/2017, 1:02 PM  [facility] [address] [place], [address] Phone: [phone]   Fax:  [phone]  Name: Bridget [name] MRN: [id number] Date of Birth: Jan 01, 1956

## 2017-12-06 DIAGNOSIS — C44319 Basal cell carcinoma of skin of other parts of face: Secondary | ICD-10-CM | POA: Diagnosis not present

## 2017-12-06 DIAGNOSIS — L905 Scar conditions and fibrosis of skin: Secondary | ICD-10-CM | POA: Diagnosis not present

## 2017-12-06 HISTORY — PX: BASAL CELL CARCINOMA EXCISION: SHX1214

## 2017-12-09 ENCOUNTER — Ambulatory Visit: Payer: 59 | Admitting: Physical Therapy

## 2017-12-09 ENCOUNTER — Encounter: Payer: Self-pay | Admitting: Physical Therapy

## 2017-12-09 DIAGNOSIS — M25511 Pain in right shoulder: Secondary | ICD-10-CM

## 2017-12-09 DIAGNOSIS — M6281 Muscle weakness (generalized): Secondary | ICD-10-CM | POA: Diagnosis not present

## 2017-12-09 DIAGNOSIS — M62838 Other muscle spasm: Secondary | ICD-10-CM | POA: Diagnosis not present

## 2017-12-09 DIAGNOSIS — M25611 Stiffness of right shoulder, not elsewhere classified: Secondary | ICD-10-CM | POA: Diagnosis not present

## 2017-12-09 NOTE — Therapy (Signed)
[facility] [address] [place], [address] Phone: [phone]   Fax:  [phone]  Physical Therapy Treatment  Patient Details  Name: Bridget [name] MRN: [id number] Date of Birth: June 26, 1956 Referring Provider: Thornton Park MD   Encounter Date: 12/09/2017  PT End of Session - 12/09/17 1926    Visit Number  7    Number of Visits  16    Date for PT Re-Evaluation  01/06/18    PT Start Time  1700    PT Stop Time  1750    PT Time Calculation (min)  50 min    Activity Tolerance  Patient tolerated treatment well;Patient limited by pain    Behavior During Therapy  [facility] for tasks assessed/performed       Past Medical History:  Diagnosis Date  . 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 Cancer (HCC)    BASAL CELL  . Chronic back pain   . GERD (gastroesophageal reflux disease)    OCC    Past Surgical History:  Procedure Laterality Date  . ANKLE SURGERY Right   . CESAREAN SECTION  [phone]   hodp pre term labor CS  . OVARIAN CYST SURGERY  1973  . SHOULDER ARTHROSCOPY Right 10/31/2017   Procedure: ARTHROSCOPY SHOULDER WITH LYSIS OF ADHESION, SUBACROMINAL DECOMPRESSION,DISTAL CLAVICLE EXCISION.;  Surgeon: Thornton Park, MD;  Location: [facility];  Service: Orthopedics;  Laterality: Right;  SHOULDER ARTHROSCOPY WITH LYSIS AND RESECTION OF ADHESIONS  . SHOULDER ARTHROSCOPY WITH OPEN ROTATOR CUFF REPAIR AND DISTAL CLAVICLE ACROMINECTOMY Right 03/19/2017   Procedure: SHOULDER ARTHROSCOPY WITH OPEN ROTATOR CUFF REPAIR AND SUBACROMINAL DECOMPRESSION;  Surgeon: Thornton Park, MD;  Location: [facility];  Service: Orthopedics;  Laterality: Right;  . TUBAL LIGATION  1999  . WRIST GANGLION EXCISION      There were no vitals filed for this visit.  Subjective Assessment - 12/09/17 1726    Subjective  Patient reports she is feeling tired today and has had a procedure on her forehead. She is feeling better with  right UE    Pertinent History  May 2017 lifted a case of water and felt a pain in right shoulder. She had PT prior to surgery without lasting results and then had MRI and underwent RTC repair 03/19/2017. She also has back pain.     Limitations  Lifting;House hold activities;Other (comment) work related activities, overhead motion    Patient Stated Goals  return to full functional use right UE to return to prior level of function    Currently in Pain?  Other (Comment) righ tight upper trapezius         Objective:  PROM right shoulder150degrees elevation with repetition, rotation to 45 degrees ER  Palpation: moderate spasm and tenderness in right upper trapeziusand along medial border right scapula  Treatment: Modalities: Electrical stimulation:19min.:Russian stim. 10/10 cycle applied (2) electrodes toright shoulder periscapular muscles (rhomboids, lower trapezius); HV estim applied to spasms right upper trapezius and posterior aspect of shoulder, intensity to tolerance: muscle spasm clinical program;  withpatientseated in chair with right UE supported on pillowgoal muscle re education; moist heat pack applied to right shoulder during estim.: no adverse reactions noted  Manual therapy:10min. Patient side lying left: STM performed with superficial and compression techniques along cervical spine paraspinal muscles and right upper trapezius muscle, along medial border of right scapula: goal: spasms, improved soft tissue elasticity  Therapeutic exercise: patient performed with assistance of therapist with verbal  and tactile cues: goals: improve Quick Dash, ROM Patient supine lying with right UE supported on towels to avoid extension of shoulder and decrease strain on repair AAROM performed to right UE elbow flexion/extension 2 x 10 PROM/AAROM right shoulder forward elevation 2 x 10 with patient performing independently with guidance for correct alignment of shoulder and  stabilization of scapula to avoid pain in upper arm/shoulder  Side lying left:  right shoulder ER  x 10 scapular control exercises elevation/depression and scapular retraction with depression 2  x 10 each with tactile cues and manual resistance given to scapular retraction/depression Right shoulder abduction x 10 to 90 degrees with VC and tactile cues ( elbow flexed 90 degrees) for short lever arm  Patient response to treatment:Patinet demonstrated improved motor control and ROM with minimal cuing and assistance. Patient demonstrated improved soft tissue elasticity with decreased tenderness right upper trapezius with STM.    PT Education - 12/09/17 1730    Education provided  Yes    Education Details  exercise instruction for right shoulder abduction, ER, forward elevation    Person(s) Educated  Patient    Methods  Explanation;Demonstration;Verbal cues    Comprehension  Verbal cues required;Returned demonstration;Verbalized understanding          PT Long Term Goals - 11/11/17 1700      PT LONG TERM GOAL #1   Title  Patient will demonstrate improved functional use right UE with decreased pain with QuickDash score of 50% or less     Baseline  QuickDash 80%    Status  New    Target Date  12/09/17      PT LONG TERM GOAL #2   Title  Patient will be independent with home program for pain control, flexibiltiy and strength to allow self management following discharge from physical therapy     Baseline  limited knowlege of appropriate pain control strategies, exercises and progression without moderate assistance    Status  Revised    Target Date  01/06/18      PT LONG TERM GOAL #3   Title  Patient will be able to perform household work/ chores without increase in symptoms.    Baseline  unable to perform household chores due to recent surgery    Status  New    Target Date  01/06/18      PT LONG TERM GOAL #4   Title  Patient will demonstrate improved functional use right UE with  decreased pain to mild with QuickDash score of 20% or less     Baseline  80%    Status  New    Target Date  01/06/18            Plan - 12/09/17 1802    Clinical Impression Statement  Patient demonstrates steady improvement towards goals with improved AAROM right shoulder forward elevation and rotation. she continues with pain right shoulder and decreased strength that limits full functional use right UE.     Rehab Potential  Good    Clinical Impairments Affecting Rehab Potential  (+)acute condition, motivated, prior level of function (-) chronic condition prior to surgery    PT Frequency  2x / week    PT Duration  8 weeks    PT Treatment/Interventions  Patient/family education;Electrical Stimulation;Cryotherapy;Moist Heat;Neuromuscular re-education;Therapeutic exercise;Manual techniques;Ultrasound    PT Next Visit Plan  continue with stabilization, muscle re education, ROM exercises right shoulder     PT Home Exercise Plan  scapular retraction, upper trapezius stretches, supine  AAROM and side lying exercises       Patient will benefit from skilled therapeutic intervention in order to improve the following deficits and impairments:  Decreased strength, Pain, Impaired perceived functional ability, Decreased activity tolerance, Decreased endurance, Increased muscle spasms, Impaired UE functional use, Decreased range of motion  Visit Diagnosis: Acute pain of right shoulder  Other muscle spasm  Muscle weakness (generalized)  Stiffness of right shoulder, not elsewhere classified     Problem List Patient Active Problem List   Diagnosis Date Noted  . Pre-operative examination 02/21/2017  . Low back pain 06/11/2016  . Osteopenia 09/30/2013  . Encounter for routine gynecological examination 01/18/2012  . Routine general medical examination at a health care facility 01/09/2012  . [place], [place] 02/06/2010  . DEGENERATIVE JOINT DISEASE, MILD 07/19/2009    Jomarie Longs  PT 12/10/2017, 11:09 PM  [facility] [address] [place], [address] Phone: [phone]   Fax:  [phone]  Name: SELEENA REIMERS MRN: [id number] Date of Birth: [birth year]

## 2017-12-11 ENCOUNTER — Ambulatory Visit: Payer: 59 | Admitting: Physical Therapy

## 2017-12-11 DIAGNOSIS — M25611 Stiffness of right shoulder, not elsewhere classified: Secondary | ICD-10-CM | POA: Diagnosis not present

## 2017-12-11 DIAGNOSIS — M25511 Pain in right shoulder: Secondary | ICD-10-CM

## 2017-12-11 DIAGNOSIS — M6281 Muscle weakness (generalized): Secondary | ICD-10-CM

## 2017-12-11 DIAGNOSIS — M62838 Other muscle spasm: Secondary | ICD-10-CM | POA: Diagnosis not present

## 2017-12-12 NOTE — Therapy (Signed)
[facility] [address] [place], [address] Phone: [phone]   Fax:  [phone]  Physical Therapy Treatment  Patient Details  Name: Bridget [name] MRN: [id number] Date of Birth: 1956-05-21 Referring Provider: Thornton Park MD   Encounter Date: 12/11/2017  PT End of Session - 12/11/17 1256    Visit Number  8    Number of Visits  16    Date for PT Re-Evaluation  01/06/18    PT Start Time  6962    PT Stop Time  1250    PT Time Calculation (min)  57 min    Activity Tolerance  Patient tolerated treatment well;Patient limited by pain    Behavior During Therapy  [facility] for tasks assessed/performed       Past Medical History:  Diagnosis Date  . 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 Cancer (HCC)    BASAL CELL  . Chronic back pain   . GERD (gastroesophageal reflux disease)    OCC    Past Surgical History:  Procedure Laterality Date  . ANKLE SURGERY Right   . CESAREAN SECTION  [phone]   hodp pre term labor CS  . OVARIAN CYST SURGERY  1973  . SHOULDER ARTHROSCOPY Right 10/31/2017   Procedure: ARTHROSCOPY SHOULDER WITH LYSIS OF ADHESION, SUBACROMINAL DECOMPRESSION,DISTAL CLAVICLE EXCISION.;  Surgeon: Thornton Park, MD;  Location: [facility];  Service: Orthopedics;  Laterality: Right;  SHOULDER ARTHROSCOPY WITH LYSIS AND RESECTION OF ADHESIONS  . SHOULDER ARTHROSCOPY WITH OPEN ROTATOR CUFF REPAIR AND DISTAL CLAVICLE ACROMINECTOMY Right 03/19/2017   Procedure: SHOULDER ARTHROSCOPY WITH OPEN ROTATOR CUFF REPAIR AND SUBACROMINAL DECOMPRESSION;  Surgeon: Thornton Park, MD;  Location: [facility];  Service: Orthopedics;  Laterality: Right;  . TUBAL LIGATION  1999  . WRIST GANGLION EXCISION      There were no vitals filed for this visit.  Subjective Assessment - 12/11/17 1153    Subjective  Patient reports she is feeling better with right UE and is still having increased pain with raising arm  overhead.     Pertinent History  May 2017 lifted a case of water and felt a pain in right shoulder. She had PT prior to surgery without lasting results and then had MRI and underwent RTC repair 03/19/2017. She also has back pain.     Limitations  Lifting;House hold activities;Other (comment) work related activities, overhead motion    Patient Stated Goals  return to full functional use right UE to return to prior level of function    Currently in Pain?  Other (Comment) intermittent pain in right shoulder with use       Objective:  PROM right shoulder 130degrees pre treatment, rotation to 45 degrees ER  Palpation: moderate spasm and tenderness in right upper trapeziusand along medial border right scapula and rotator cuff musculature  Treatment: Modalities: Electrical stimulation:1min.:Russian stim. 10/10 cycle applied (2) electrodes toright shoulder periscapular muscles (rhomboids, lower trapezius);  withpatientseated in chair with right UE supported on pillowgoal muscle re education; moist heat pack applied to right shoulder during estim.: no adverse reactions noted [place] pulsed 20% 3MHz 0.8 w/cm2 static over TrP right upper trapezius x 3 min. for decreasing pain, spasms with good results of mild to no spasm and no tenderness following treatment   Manual therapy:78min. Patient side lying left and supine: STM performed with superficial and compression techniques along cervical spine paraspinal muscles and right upper trapezius muscle, along medial border of right  scapula, rotator cuff musculature: goal: spasms, improved soft tissue elasticity  Therapeutic exercise: patient performed with assistance of therapist with verbal and tactile cues: goals: improve Quick Dash, ROM Patient supine lying with right UE supported on towels to avoid extension of shoulder and decrease strain on repair AAROM performed to right UE elbow flexion/extension 2 x 10 PROM/AAROM right shoulder forward  elevation 2 x 10 with patient performing with guidance for correct alignment of shoulder and stabilization of scapula to avoid pain in upper arm/shoulder  Side lying left:  right shoulder ER 2 x 10 scapular control exercises elevation/depression and scapular retraction with depression 2  x 10 each with tactile cues and manual resistance given to scapular retraction/depression Right shoulder abduction 2 x 10 to 90 degrees with VC and tactile cues ( elbow flexed 90 degrees) for short lever arm  Patient response to treatment:Patient demonstrated improved soft tissue elasticity with decreased tenderness right upper trapezius with STM, improved AAROM to 150+ degrees with less discomfort and improved stabilization following treatment.    PT Education - 12/11/17 1254    Education provided  Yes    Education Details  exercise instruction for technique and correct alignment   Person(s) Educated  Patient    Methods  Explanation;Demonstration;Verbal cues    Comprehension  Verbalized understanding;Returned demonstration;Verbal cues required          PT Long Term Goals - 11/11/17 1700      PT LONG TERM GOAL #1   Title  Patient will demonstrate improved functional use right UE with decreased pain with QuickDash score of 50% or less     Baseline  QuickDash 80%    Status  New    Target Date  12/09/17      PT LONG TERM GOAL #2   Title  Patient will be independent with home program for pain control, flexibiltiy and strength to allow self management following discharge from physical therapy     Baseline  limited knowlege of appropriate pain control strategies, exercises and progression without moderate assistance    Status  Revised    Target Date  01/06/18      PT LONG TERM GOAL #3   Title  Patient will be able to perform household work/ chores without increase in symptoms.    Baseline  unable to perform household chores due to recent surgery    Status  New    Target Date  01/06/18      PT  LONG TERM GOAL #4   Title  Patient will demonstrate improved functional use right UE with decreased pain to mild with QuickDash score of 20% or less     Baseline  80%    Status  New    Target Date  01/06/18            Plan - 12/11/17 1300    Clinical Impression Statement  Patient demonstrates improvement with shoulder ROM and strength with physical therapy intervention. She continues with pain in right shoulder with forward elevation with shoulder positioned close to trunk. She will benefit from continued physical therapy intervention to achieve full pain free motion and strength to allow return to prior level of function.    Rehab Potential  Good    Clinical Impairments Affecting Rehab Potential  (+)acute condition, motivated, prior level of function (-) chronic condition prior to surgery    PT Frequency  2x / week    PT Duration  8 weeks    PT Treatment/Interventions  Patient/family  education;Electrical Stimulation;Cryotherapy;Moist Heat;Neuromuscular re-education;Therapeutic exercise;Manual techniques;Ultrasound    PT Next Visit Plan  continue with stabilization, muscle re education, ROM exercises right shoulder     PT Home Exercise Plan  scapular retraction, upper trapezius stretches, supine AAROM and side lying exercises       Patient will benefit from skilled therapeutic intervention in order to improve the following deficits and impairments:  Decreased strength, Pain, Impaired perceived functional ability, Decreased activity tolerance, Decreased endurance, Increased muscle spasms, Impaired UE functional use, Decreased range of motion  Visit Diagnosis: Acute pain of right shoulder  Other muscle spasm  Muscle weakness (generalized)  Stiffness of right shoulder, not elsewhere classified     Problem List Patient Active Problem List   Diagnosis Date Noted  . Pre-operative examination 02/21/2017  . Low back pain 06/11/2016  . Osteopenia 09/30/2013  . Encounter for  routine gynecological examination 01/18/2012  . Routine general medical examination at a health care facility 01/09/2012  . [place], [place] 02/06/2010  . DEGENERATIVE JOINT DISEASE, MILD 07/19/2009    Jomarie Longs PT 12/12/2017, 10:18 AM  [facility] [address], [place], [address] Phone: [phone]   Fax:  [phone]  Name: SHEZA Gubler MRN: [id number] Date of Birth: 10-18-1956

## 2017-12-18 ENCOUNTER — Ambulatory Visit: Payer: 59 | Admitting: Physical Therapy

## 2017-12-18 ENCOUNTER — Encounter: Payer: Self-pay | Admitting: Physical Therapy

## 2017-12-18 DIAGNOSIS — M6281 Muscle weakness (generalized): Secondary | ICD-10-CM

## 2017-12-18 DIAGNOSIS — M62838 Other muscle spasm: Secondary | ICD-10-CM | POA: Diagnosis not present

## 2017-12-18 DIAGNOSIS — M25611 Stiffness of right shoulder, not elsewhere classified: Secondary | ICD-10-CM | POA: Diagnosis not present

## 2017-12-18 DIAGNOSIS — M25511 Pain in right shoulder: Secondary | ICD-10-CM | POA: Diagnosis not present

## 2017-12-18 NOTE — Therapy (Signed)
[facility] [address] [place], [address] Phone: [phone]   Fax:  [phone]  Physical Therapy Treatment  Patient Details  Name: Bridget [name] MRN: [id number] Date of Birth: 03/02/1956 Referring Provider: Thornton Park MD   Encounter Date: 12/18/2017  PT End of Session - 12/18/17 1913    Visit Number  9    Number of Visits  16    Date for PT Re-Evaluation  01/06/18    PT Start Time  1633    PT Stop Time  1726    PT Time Calculation (min)  53 min    Activity Tolerance  Patient tolerated treatment well;Patient limited by pain    Behavior During Therapy  [facility] for tasks assessed/performed       Past Medical History:  Diagnosis Date  . 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 Cancer (HCC)    BASAL CELL  . Chronic back pain   . GERD (gastroesophageal reflux disease)    OCC    Past Surgical History:  Procedure Laterality Date  . ANKLE SURGERY Right   . CESAREAN SECTION  [phone]   hodp pre term labor CS  . OVARIAN CYST SURGERY  1973  . SHOULDER ARTHROSCOPY Right 10/31/2017   Procedure: ARTHROSCOPY SHOULDER WITH LYSIS OF ADHESION, SUBACROMINAL DECOMPRESSION,DISTAL CLAVICLE EXCISION.;  Surgeon: Thornton Park, MD;  Location: [facility];  Service: Orthopedics;  Laterality: Right;  SHOULDER ARTHROSCOPY WITH LYSIS AND RESECTION OF ADHESIONS  . SHOULDER ARTHROSCOPY WITH OPEN ROTATOR CUFF REPAIR AND DISTAL CLAVICLE ACROMINECTOMY Right 03/19/2017   Procedure: SHOULDER ARTHROSCOPY WITH OPEN ROTATOR CUFF REPAIR AND SUBACROMINAL DECOMPRESSION;  Surgeon: Thornton Park, MD;  Location: [facility];  Service: Orthopedics;  Laterality: Right;  . TUBAL LIGATION  1999  . WRIST GANGLION EXCISION      There were no vitals filed for this visit.  Subjective Assessment - 12/18/17 1633    Subjective  Patient reports she is tired  and still having pain in right shoulder over acromion.     Pertinent History   May 2017 lifted a case of water and felt a pain in right shoulder. She had PT prior to surgery without lasting results and then had MRI and underwent RTC repair 03/19/2017. She also has back pain.     Limitations  Lifting;House hold activities;Other (comment) work related activities, overhead motion    Patient Stated Goals  return to full functional use right UE to return to prior level of function         Objective:  PROM right shoulder 130degrees pre treatment, post treatment flexion to 140 and rotation to60degrees ER  Palpation:moderatespasm and tenderness in right upper trapeziusand along medial border right scapula and rotator cuff musculature  Treatment: Modalities: Electrical stimulation:56min.:Russian stim. 10/10 cycle applied (2) electrodes toright shoulder periscapular muscles (rhomboids, lower trapezius); HV estim applied 920 electrodes to UT and posterior right shoulder, intensity to tolerance; withpatientseated in chair with right UE supported on pillowgoal muscle re education, spasms; moist heat packapplied to right shoulder during estim.: no adverse reactions noted [place] pulsed 50% 1MHz 1.1w/cm2  right upper trapezius x 10 min. for decreasing pain, spasms with result of decreased spasms and tenderness >50%   Therapeutic exercise:patient performed with assistance of therapist with verbal and tactile cues: goals: improve Quick Dash, ROM Patient supine lying with right UE supported on towels to avoid extension of shoulder and decrease strain on repair AAROMperformed to right UE elbow  flexion/extension 2 x 10 PROM/AAROMright shoulder forward elevation 2 x 10 with patient performing with guidance for correct alignment of shoulder and stabilization of scapula to avoid pain in upper arm/shoulder  Side lying left: right shoulder ER2 x 10 scapular control exercises elevation/depression and scapular retraction with depression2x 10 each with tactile cues and manual  resistance given to scapular retraction/depression Right shoulder abduction  x 10 to 90 degrees + Isometric ER x 5 reps followed by IR stretch with improved ROM by 10 degrees  Patient response to treatment:Patient with improved ROM and decreased spasms following treatment. She verbalized good understanding of exercises and isometric ER to assist with strength and ROM. She required minimal VC and demonstration to perform exercises with good technique and alignment  PT Education - 12/18/17 1912    Education provided  Yes    Education Details  exercise instruction for proper technique, alignment; ER isometrics     Methods  Explanation;Demonstration;Verbal cues    Comprehension  Verbalized understanding;Returned demonstration;Verbal cues required          PT Long Term Goals - 11/11/17 1700      PT LONG TERM GOAL #1   Title  Patient will demonstrate improved functional use right UE with decreased pain with QuickDash score of 50% or less     Baseline  QuickDash 80%    Status  New    Target Date  12/09/17      PT LONG TERM GOAL #2   Title  Patient will be independent with home program for pain control, flexibiltiy and strength to allow self management following discharge from physical therapy     Baseline  limited knowlege of appropriate pain control strategies, exercises and progression without moderate assistance    Status  Revised    Target Date  01/06/18      PT LONG TERM GOAL #3   Title  Patient will be able to perform household work/ chores without increase in symptoms.    Baseline  unable to perform household chores due to recent surgery    Status  New    Target Date  01/06/18      PT LONG TERM GOAL #4   Title  Patient will demonstrate improved functional use right UE with decreased pain to mild with QuickDash score of 20% or less     Baseline  80%    Status  New    Target Date  01/06/18            Plan - 12/18/17 1922    Clinical Impression Statement  Patient  continues to improve ROM and strength right UE s/p surgery. She is responding steadily with good progress towards goals. She will benefit from continued physical therapy intervention to achieve full pain free motion and strength to improve functional use right UE.     Rehab Potential  Good    Clinical Impairments Affecting Rehab Potential  (+)acute condition, motivated, prior level of function (-) chronic condition prior to surgery    PT Frequency  2x / week    PT Duration  8 weeks    PT Treatment/Interventions  Patient/family education;Electrical Stimulation;Cryotherapy;Moist Heat;Neuromuscular re-education;Therapeutic exercise;Manual techniques;Ultrasound    PT Next Visit Plan  continue with stabilization, muscle re education, ROM exercises right shoulder     PT Home Exercise Plan  scapular retraction, upper trapezius stretches, supine AAROM and side lying exercises       Patient will benefit from skilled therapeutic intervention in order to improve the  following deficits and impairments:  Decreased strength, Pain, Impaired perceived functional ability, Decreased activity tolerance, Decreased endurance, Increased muscle spasms, Impaired UE functional use, Decreased range of motion  Visit Diagnosis: Acute pain of right shoulder  Other muscle spasm  Muscle weakness (generalized)  Stiffness of right shoulder, not elsewhere classified     Problem List Patient Active Problem List   Diagnosis Date Noted  . Pre-operative examination 02/21/2017  . Low back pain 06/11/2016  . Osteopenia 09/30/2013  . Encounter for routine gynecological examination 01/18/2012  . Routine general medical examination at a health care facility 01/09/2012  . [place], [place] 02/06/2010  . DEGENERATIVE JOINT DISEASE, MILD 07/19/2009    Jomarie Longs PT 12/19/2017, 9:45 AM  [facility] [address], [place], [address] Phone:  [phone]   Fax:  [phone]  Name: LAQUANNA VEAZEY MRN: [id number] Date of Birth: 02-Aug-1956

## 2017-12-23 ENCOUNTER — Ambulatory Visit: Payer: 59 | Admitting: Physical Therapy

## 2017-12-23 ENCOUNTER — Encounter: Payer: Self-pay | Admitting: Physical Therapy

## 2017-12-23 DIAGNOSIS — M25511 Pain in right shoulder: Secondary | ICD-10-CM | POA: Diagnosis not present

## 2017-12-23 DIAGNOSIS — M62838 Other muscle spasm: Secondary | ICD-10-CM

## 2017-12-23 DIAGNOSIS — M25611 Stiffness of right shoulder, not elsewhere classified: Secondary | ICD-10-CM | POA: Diagnosis not present

## 2017-12-23 DIAGNOSIS — M6281 Muscle weakness (generalized): Secondary | ICD-10-CM | POA: Diagnosis not present

## 2017-12-23 NOTE — Therapy (Signed)
[facility] [address], [place], [address] Phone: [phone]   Fax:  [phone]  Physical Therapy Treatment  Patient Details  Name: Bridget [name] MRN: [id number] Date of Birth: [birth year] Referring Provider: Thornton Park MD   Encounter Date: 12/23/2017  PT End of Session - 12/23/17 1615    Visit Number  10    Number of Visits  16    Date for PT Re-Evaluation  01/06/18    PT Start Time  6387    PT Stop Time  1710    PT Time Calculation (min)  58 min    Activity Tolerance  Patient tolerated treatment well;Patient limited by pain    Behavior During Therapy  [facility] for tasks assessed/performed       Past Medical History:  Diagnosis Date  . 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 Cancer (HCC)    BASAL CELL  . Chronic back pain   . GERD (gastroesophageal reflux disease)    OCC    Past Surgical History:  Procedure Laterality Date  . ANKLE SURGERY Right   . CESAREAN SECTION  [phone]   hodp pre term labor CS  . OVARIAN CYST SURGERY  1973  . SHOULDER ARTHROSCOPY Right 10/31/2017   Procedure: ARTHROSCOPY SHOULDER WITH LYSIS OF ADHESION, SUBACROMINAL DECOMPRESSION,DISTAL CLAVICLE EXCISION.;  Surgeon: Thornton Park, MD;  Location: [facility];  Service: Orthopedics;  Laterality: Right;  SHOULDER ARTHROSCOPY WITH LYSIS AND RESECTION OF ADHESIONS  . SHOULDER ARTHROSCOPY WITH OPEN ROTATOR CUFF REPAIR AND DISTAL CLAVICLE ACROMINECTOMY Right 03/19/2017   Procedure: SHOULDER ARTHROSCOPY WITH OPEN ROTATOR CUFF REPAIR AND SUBACROMINAL DECOMPRESSION;  Surgeon: Thornton Park, MD;  Location: [facility];  Service: Orthopedics;  Laterality: Right;  . TUBAL LIGATION  1999  . WRIST GANGLION EXCISION      There were no vitals filed for this visit.  Subjective Assessment - 12/23/17 1613    Subjective  Patient reports she is feeling less discomfort over right shoulder and still sore.     Pertinent History   May 2017 lifted a case of water and felt a pain in right shoulder. She had PT prior to surgery without lasting results and then had MRI and underwent RTC repair 03/19/2017. She also has back pain.     Limitations  Lifting;House hold activities;Other (comment) work related activities, overhead motion    Patient Stated Goals  return to full functional use right UE to return to prior level of function    Currently in Pain?  Other (Comment) intermittnet soreness in right UE         Objective:  PROM right shoulder 130degreespre treatment,post treatment flexion to 150 Palpation:moderatespasm and tenderness in right upper trapeziusand along medial border right scapulaand rotator cuff musculature  Treatment: Modalities: Electrical stimulation: 15 min. HV estim applied (2) electrodes to right shoulder medial border of scapula, intensity to tolerance; withpatientseated in chair with right UE supported on pillowgoal muscle re education, spasms; moist heat packapplied to right shoulder during estim.: no adverse reactions noted [place] pulsed 50% 1MHz 1.1w/cm2  right upper trapeziusx 10 min. for decreasing pain, spasms with result of decreased spasms and tenderness >50%   Therapeutic exercise:patient performed with assistance of therapist with verbal and tactile cues: goals: improve Quick Dash, ROM Patient supine lying with right UE supported on towels to avoid extension of shoulder and decrease strain on repair AAROMperformed to right UE elbow flexion/extension 2 x 10 PROM/AAROMright shoulder forward  elevation 2 x 10 with patient performing with guidance for correct alignment of shoulder and stabilization of scapula to avoid pain in upper arm/shoulder  Side lying left: right shoulder ERx 10 scapular control exercises elevation/depression and scapular retraction with depression2x 10 each with tactile cues and manual resistance given to scapular retraction/depression Right shoulder  abduction  x 10 to 90 degrees + UE Ranger for forward elevation to 150 degrees and ER/IR x 20 reps each  Patient response to treatment:Patient with improved ROM and decreased spasms following treatment. She required minimal demonstration and VC for good technique with exerccises     PT Education - 12/23/17 9323    Education provided  Yes    Education Details  exericse instruction technique    Person(s) Educated  Patient    Methods  Explanation;Demonstration;Verbal cues    Comprehension  Verbal cues required;Returned demonstration;Verbalized understanding          PT Long Term Goals - 11/11/17 1700      PT LONG TERM GOAL #1   Title  Patient will demonstrate improved functional use right UE with decreased pain with QuickDash score of 50% or less     Baseline  QuickDash 80%    Status  New    Target Date  12/09/17      PT LONG TERM GOAL #2   Title  Patient will be independent with home program for pain control, flexibiltiy and strength to allow self management following discharge from physical therapy     Baseline  limited knowlege of appropriate pain control strategies, exercises and progression without moderate assistance    Status  Revised    Target Date  01/06/18      PT LONG TERM GOAL #3   Title  Patient will be able to perform household work/ chores without increase in symptoms.    Baseline  unable to perform household chores due to recent surgery    Status  New    Target Date  01/06/18      PT LONG TERM GOAL #4   Title  Patient will demonstrate improved functional use right UE with decreased pain to mild with QuickDash score of 20% or less     Baseline  80%    Status  New    Target Date  01/06/18            Plan - 12/23/17 1616    Clinical Impression Statement  Patient contiues to improve ROM and strength right UE s/p surgery and is feeling less discomfort and pulling in right upper arm with raising arm above shoulder level. She should continue to improve  with additional physical therapy intervention.     Rehab Potential  Good    Clinical Impairments Affecting Rehab Potential  (+)acute condition, motivated, prior level of function (-) chronic condition prior to surgery    PT Frequency  2x / week    PT Duration  8 weeks    PT Treatment/Interventions  Patient/family education;Electrical Stimulation;Cryotherapy;Moist Heat;Neuromuscular re-education;Therapeutic exercise;Manual techniques;Ultrasound    PT Next Visit Plan  continue with stabilization, muscle re education, ROM exercises right shoulder     PT Home Exercise Plan  scapular retraction, upper trapezius stretches, supine AAROM and side lying exercises       Patient will benefit from skilled therapeutic intervention in order to improve the following deficits and impairments:  Decreased strength, Pain, Impaired perceived functional ability, Decreased activity tolerance, Decreased endurance, Increased muscle spasms, Impaired UE functional use, Decreased range of  motion  Visit Diagnosis: Acute pain of right shoulder  Other muscle spasm  Muscle weakness (generalized)  Stiffness of right shoulder, not elsewhere classified     Problem List Patient Active Problem List   Diagnosis Date Noted  . Pre-operative examination 02/21/2017  . Low back pain 06/11/2016  . Osteopenia 09/30/2013  . Encounter for routine gynecological examination 01/18/2012  . Routine general medical examination at a health care facility 01/09/2012  . [place], [place] 02/06/2010  . DEGENERATIVE JOINT DISEASE, MILD 07/19/2009    Jomarie Longs PT 12/23/2017, 6:08 PM  [facility] [address] [place], [address] Phone: [phone]   Fax:  [phone]  Name: Bridget [name] MRN: [id number] Date of Birth: 1956/11/15

## 2017-12-25 ENCOUNTER — Encounter: Payer: Self-pay | Admitting: Physical Therapy

## 2017-12-25 ENCOUNTER — Ambulatory Visit: Payer: 59 | Attending: Orthopedic Surgery | Admitting: Physical Therapy

## 2017-12-25 DIAGNOSIS — M6281 Muscle weakness (generalized): Secondary | ICD-10-CM | POA: Insufficient documentation

## 2017-12-25 DIAGNOSIS — M25611 Stiffness of right shoulder, not elsewhere classified: Secondary | ICD-10-CM | POA: Insufficient documentation

## 2017-12-25 DIAGNOSIS — M25511 Pain in right shoulder: Secondary | ICD-10-CM | POA: Insufficient documentation

## 2017-12-25 DIAGNOSIS — M62838 Other muscle spasm: Secondary | ICD-10-CM | POA: Diagnosis not present

## 2017-12-25 NOTE — Therapy (Signed)
[facility] [address] [place], [address] Phone: [phone]   Fax:  [phone]  Physical Therapy Treatment  Patient Details  Name: Bridget [name] MRN: [id number] Date of Birth: 1956-07-08 Referring Provider: Thornton Park MD   Encounter Date: 12/25/2017  PT End of Session - 12/25/17 1924    Visit Number  11    Number of Visits  16    Date for PT Re-Evaluation  01/06/18    PT Start Time  8416    PT Stop Time  1240    PT Time Calculation (min)  45 min    Activity Tolerance  Patient tolerated treatment well;Patient limited by pain    Behavior During Therapy  [facility] for tasks assessed/performed       Past Medical History:  Diagnosis Date  . 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 Cancer (HCC)    BASAL CELL  . Chronic back pain   . GERD (gastroesophageal reflux disease)    OCC    Past Surgical History:  Procedure Laterality Date  . ANKLE SURGERY Right   . CESAREAN SECTION  [phone]   hodp pre term labor CS  . OVARIAN CYST SURGERY  1973  . SHOULDER ARTHROSCOPY Right 10/31/2017   Procedure: ARTHROSCOPY SHOULDER WITH LYSIS OF ADHESION, SUBACROMINAL DECOMPRESSION,DISTAL CLAVICLE EXCISION.;  Surgeon: Thornton Park, MD;  Location: [facility];  Service: Orthopedics;  Laterality: Right;  SHOULDER ARTHROSCOPY WITH LYSIS AND RESECTION OF ADHESIONS  . SHOULDER ARTHROSCOPY WITH OPEN ROTATOR CUFF REPAIR AND DISTAL CLAVICLE ACROMINECTOMY Right 03/19/2017   Procedure: SHOULDER ARTHROSCOPY WITH OPEN ROTATOR CUFF REPAIR AND SUBACROMINAL DECOMPRESSION;  Surgeon: Thornton Park, MD;  Location: [facility];  Service: Orthopedics;  Laterality: Right;  . TUBAL LIGATION  1999  . WRIST GANGLION EXCISION      There were no vitals filed for this visit.  Subjective Assessment - 12/25/17 1159    Subjective  Patient reports she is doing better and having less pain with raising right arm above shoulder level.    Pertinent History  May 2017 lifted a case of water and felt a pain in right shoulder. She had PT prior to surgery without lasting results and then had MRI and underwent RTC repair 03/19/2017. She also has back pain.     Limitations  Lifting;House hold activities;Other (comment) work related activities, overhead motion    Patient Stated Goals  return to full functional use right UE to return to prior level of function    Currently in Pain?  Other (Comment) occasional soreness right shoulder           Objective:  PROM right shoulder 130degreespre treatment,post treatment flexion to 150 supine and standing  Palpation:moderatespasm and tenderness in right upper trapeziusand along medial border right scapulaand rotator cuff musculature  Treatment: Modalities: [place] pulsed50%1MHz1.1w/cm2 right upper trapeziusx10 min.for decreasing pain, spasms withresult of decreased spasms and tenderness >50%  Manual therapy: 10 min. Goal: spasms, improve flexiblity STM superficial and compression techniques performed to right shoulder complex, upper trapezius and along medial border of scapula  Therapeutic exercise:patient performed with assistance of therapist with verbal and tactile cues: goals: improve Quick Dash, ROM Patient supine lying with right UE supported on towels to avoid extension of shoulder and decrease strain on repair AAROMperformed to right UE elbow flexion/extension 2 x 10 PROM/AAROMright shoulder forward elevation 2 x 10 with patient performing with guidance for correct alignment of shoulder and stabilization of scapula  to avoid pain in upper arm/shoulder  Side lying left: right shoulder ERx 10 scapular control exercises elevation/depression and scapular retraction with depression2x 10 each with tactile cues and manual resistance given to scapular retraction/depression Right shoulder abduction x 10 to 90 degrees + UE Ranger for forward elevation to 150 degrees and  ER/IR x 20 reps each AAROM at wall with back against wall and facing wall  Patient response to treatment:Patient demonstrated decreased spasms by up to 50% in upper trapezius and along right scapula following STM. She is progressing with strength and flexibility exercises with good technique and improved motor control with minimal cuing and with repetition.       PT Education - 12/25/17 1159    Education provided  Yes    Education Details  re assessed exercises for UT stretching, perscapular strengthening     Person(s) Educated  Patient    Methods  Explanation;Demonstration;Verbal cues    Comprehension  Verbalized understanding;Returned demonstration;Verbal cues required          PT Long Term Goals - 12/25/17 1424      PT LONG TERM GOAL #1   Title  Patient will demonstrate improved functional use right UE with decreased pain with QuickDash score of 50% or less     Baseline  QuickDash 80%; 12/25/17 48%    Status  Achieved      PT LONG TERM GOAL #2   Title  Patient will be independent with home program for pain control, flexibiltiy and strength to allow self management following discharge from physical therapy     Baseline  limited knowlege of appropriate pain control strategies, exercises and progression without moderate assistance    Status  On-going    Target Date  01/06/18      PT LONG TERM GOAL #3   Title  Patient will be able to perform household work/ chores without increase in symptoms.    Baseline  unable to perform household chores due to recent surgery    Status  On-going    Target Date  01/06/18      PT LONG TERM GOAL #4   Title  Patient will demonstrate improved functional use right UE with decreased pain to mild with QuickDash score of 20% or less     Baseline  80%; 12/25/17 48%    Status  On-going    Target Date  01/06/18            Plan - 12/25/17 1300    Clinical Impression Statement  Patient continues to progress steadily with goals with improvement  noted AAROM and strength right UE. She will continue 1x/week for physical therapy with anticipation of discharge within the next month.     Rehab Potential  Good    Clinical Impairments Affecting Rehab Potential  (+)acute condition, motivated, prior level of function (-) chronic condition prior to surgery    PT Frequency  2x / week    PT Duration  8 weeks    PT Treatment/Interventions  Patient/family education;Electrical Stimulation;Cryotherapy;Moist Heat;Neuromuscular re-education;Therapeutic exercise;Manual techniques;Ultrasound    PT Next Visit Plan  continue with stabilization, muscle re education, ROM exercises right shoulder     PT Home Exercise Plan  scapular retraction, upper trapezius stretches, supine AAROM and side lying exercises; self thoracic spine mobilization/stretching, AAROM standing at wall        Patient will benefit from skilled therapeutic intervention in order to improve the following deficits and impairments:  Decreased strength, Pain, Impaired perceived functional ability,  Decreased activity tolerance, Decreased endurance, Increased muscle spasms, Impaired UE functional use, Decreased range of motion  Visit Diagnosis: Acute pain of right shoulder  Other muscle spasm  Muscle weakness (generalized)  Stiffness of right shoulder, not elsewhere classified     Problem List Patient Active Problem List   Diagnosis Date Noted  . Pre-operative examination 02/21/2017  . Low back pain 06/11/2016  . Osteopenia 09/30/2013  . Encounter for routine gynecological examination 01/18/2012  . Routine general medical examination at a health care facility 01/09/2012  . [place], [place] 02/06/2010  . DEGENERATIVE JOINT DISEASE, MILD 07/19/2009    Jomarie Longs PT 12/26/2017, 2:38 PM  [facility] [address] [place], [address] Phone: [phone]   Fax:  [phone]  Name: NAVYA TIMMONS MRN:  [id number] Date of Birth: 08/31/1956

## 2017-12-30 ENCOUNTER — Ambulatory Visit: Payer: 59 | Admitting: Physical Therapy

## 2018-01-01 ENCOUNTER — Encounter: Payer: Self-pay | Admitting: Physical Therapy

## 2018-01-01 ENCOUNTER — Ambulatory Visit: Payer: 59 | Admitting: Physical Therapy

## 2018-01-01 DIAGNOSIS — M25511 Pain in right shoulder: Secondary | ICD-10-CM

## 2018-01-01 DIAGNOSIS — M25611 Stiffness of right shoulder, not elsewhere classified: Secondary | ICD-10-CM | POA: Diagnosis not present

## 2018-01-01 DIAGNOSIS — M6281 Muscle weakness (generalized): Secondary | ICD-10-CM | POA: Diagnosis not present

## 2018-01-01 DIAGNOSIS — M62838 Other muscle spasm: Secondary | ICD-10-CM | POA: Diagnosis not present

## 2018-01-01 NOTE — Therapy (Signed)
[facility] [address], [place], [address] Phone: [phone]   Fax:  [phone]  Physical Therapy Treatment  Patient Details  Name: Bridget [name] MRN: [id number] Date of Birth: 1956/02/12 Referring Provider: Thornton Park MD   Encounter Date: 01/01/2018  PT End of Session - 01/01/18 1716    Visit Number  12    Number of Visits  16    Date for PT Re-Evaluation  01/06/18    PT Start Time  0910    PT Stop Time  0949    PT Time Calculation (min)  39 min    Activity Tolerance  Patient tolerated treatment well;Patient limited by pain    Behavior During Therapy  [facility] for tasks assessed/performed       Past Medical History:  Diagnosis Date  . 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 Cancer (HCC)    BASAL CELL  . Chronic back pain   . GERD (gastroesophageal reflux disease)    OCC    Past Surgical History:  Procedure Laterality Date  . ANKLE SURGERY Right   . CESAREAN SECTION  [phone]   hodp pre term labor CS  . OVARIAN CYST SURGERY  1973  . SHOULDER ARTHROSCOPY Right 10/31/2017   Procedure: ARTHROSCOPY SHOULDER WITH LYSIS OF ADHESION, SUBACROMINAL DECOMPRESSION,DISTAL CLAVICLE EXCISION.;  Surgeon: Thornton Park, MD;  Location: [facility];  Service: Orthopedics;  Laterality: Right;  SHOULDER ARTHROSCOPY WITH LYSIS AND RESECTION OF ADHESIONS  . SHOULDER ARTHROSCOPY WITH OPEN ROTATOR CUFF REPAIR AND DISTAL CLAVICLE ACROMINECTOMY Right 03/19/2017   Procedure: SHOULDER ARTHROSCOPY WITH OPEN ROTATOR CUFF REPAIR AND SUBACROMINAL DECOMPRESSION;  Surgeon: Thornton Park, MD;  Location: [facility];  Service: Orthopedics;  Laterality: Right;  . TUBAL LIGATION  1999  . WRIST GANGLION EXCISION      There were no vitals filed for this visit.  Subjective Assessment - 01/01/18 0914    Subjective  Patient reports she is noticing improvement with ROM and less pain in arm. She continues with soreness on top  of shoulder over [facility] joint region.     Pertinent History  May 2017 lifted a case of water and felt a pain in right shoulder. She had PT prior to surgery without lasting results and then had MRI and underwent RTC repair 03/19/2017. She also has back pain.     Limitations  Lifting;House hold activities;Other (comment) work related activities, overhead motion    Patient Stated Goals  return to full functional use right UE to return to prior level of function    Currently in Pain?  Other (Comment) occasional soreness right shoulder         Objective:  PROM right shoulder 165degreespre treatment, ER 90, IR 60  Palpation:moderatespasm and tenderness in right upper trapeziusand along medial border right scapulaand rotator cuff musculature  Treatment: Modalities: [place] pulsed50%1MHz1.1w/cm2 right upper trapezius and superior shoulder regionx10 min.for decreasing pain, spasms withresult of decreased spasms and tenderness >50%  Manual therapy: 8 min. Goal: spasms, improve flexiblity STM superficial and compression techniques performed to right shoulder complex, upper trapezius and along medial border of scapula  Therapeutic exercise:patient performed with assistance of therapist with verbal and tactile cues: goals: improve Quick Dash, ROM Patient supine lying with right UE supported on towels to avoid extension of shoulder and decrease strain on repair PROM/AAROMright shoulder forward elevation 2 x 10 with patient performing with guidance for correct alignment of shoulder and stabilization of scapula  to avoid pain in upper arm/shoulder  1# dumbbell for RS  At 90 degrees forward elevation 10x Clockwise/Counter clockwise 3# forward flexion bilateral 2 x 10 and chest press supine lying 2 x 10  Side lying left: right shoulder ERx 10 with 1# weight AAROM at wall with back against wall and facing wall   Patient response to treatment:Patient demonstrated improved AROM supine flexion  to WNL without pain and able to perform mild strengthening with minimal VC for correct technique and alignment. Patient demonstrated decreased spasms by up to 50% in upper trapezius and along right scapula following [place] and STM.      PT Education - 01/01/18 0930    Education provided  Yes    Education Details  exercise instruction for strengthening supine stabilization, bilateral flexion overhead with 3# weight between hands    Person(s) Educated  Patient    Methods  Explanation;Demonstration;Verbal cues;Handout    Comprehension  Verbalized understanding;Returned demonstration;Verbal cues required          PT Long Term Goals - 12/25/17 1424      PT LONG TERM GOAL #1   Title  Patient will demonstrate improved functional use right UE with decreased pain with QuickDash score of 50% or less     Baseline  QuickDash 80%; 12/25/17 48%    Status  Achieved      PT LONG TERM GOAL #2   Title  Patient will be independent with home program for pain control, flexibiltiy and strength to allow self management following discharge from physical therapy     Baseline  limited knowlege of appropriate pain control strategies, exercises and progression without moderate assistance    Status  On-going    Target Date  01/06/18      PT LONG TERM GOAL #3   Title  Patient will be able to perform household work/ chores without increase in symptoms.    Baseline  unable to perform household chores due to recent surgery    Status  On-going    Target Date  01/06/18      PT LONG TERM GOAL #4   Title  Patient will demonstrate improved functional use right UE with decreased pain to mild with QuickDash score of 20% or less     Baseline  80%; 12/25/17 48%    Status  On-going    Target Date  01/06/18            Plan - 01/01/18 1000    Clinical Impression Statement  Patient continues to progress well towards full ROM and is improving strength in right UE following surgery. She continues with decresaed endurance  and strength in right UE and should continue to progress as she heals and continues with physical therapy intervention and self manangement/ home exercise program.     Rehab Potential  Good    Clinical Impairments Affecting Rehab Potential  (+)acute condition, motivated, prior level of function (-) chronic condition prior to surgery    PT Frequency  2x / week    PT Duration  8 weeks    PT Treatment/Interventions  Patient/family education;Electrical Stimulation;Cryotherapy;Moist Heat;Neuromuscular re-education;Therapeutic exercise;Manual techniques;Ultrasound    PT Next Visit Plan  continue with stabilization, muscle re education, ROM exercises right shoulder     PT Home Exercise Plan  scapular retraction, upper trapezius stretches, supine AAROM and side lying exercises; self thoracic spine mobilization/stretching, AAROM standing at wall        Patient will benefit from skilled therapeutic intervention in order  to improve the following deficits and impairments:  Decreased strength, Pain, Impaired perceived functional ability, Decreased activity tolerance, Decreased endurance, Increased muscle spasms, Impaired UE functional use, Decreased range of motion  Visit Diagnosis: Acute pain of right shoulder  Other muscle spasm  Muscle weakness (generalized)     Problem List Patient Active Problem List   Diagnosis Date Noted  . Pre-operative examination 02/21/2017  . Low back pain 06/11/2016  . Osteopenia 09/30/2013  . Encounter for routine gynecological examination 01/18/2012  . Routine general medical examination at a health care facility 01/09/2012  . [place], [place] 02/06/2010  . DEGENERATIVE JOINT DISEASE, MILD 07/19/2009    Jomarie Longs PT 01/01/2018, 6:52 PM  [place] [address] [place], [address] Phone: [phone]   Fax:  [phone]  Name: KESHONA KARTES MRN: [id number] Date of Birth:  09-29-1956

## 2018-01-06 ENCOUNTER — Encounter: Payer: 59 | Admitting: Physical Therapy

## 2018-01-08 ENCOUNTER — Ambulatory Visit: Payer: 59 | Admitting: Physical Therapy

## 2018-01-08 DIAGNOSIS — M25511 Pain in right shoulder: Secondary | ICD-10-CM | POA: Diagnosis not present

## 2018-01-08 DIAGNOSIS — M62838 Other muscle spasm: Secondary | ICD-10-CM | POA: Diagnosis not present

## 2018-01-08 DIAGNOSIS — M6281 Muscle weakness (generalized): Secondary | ICD-10-CM | POA: Diagnosis not present

## 2018-01-08 DIAGNOSIS — M25611 Stiffness of right shoulder, not elsewhere classified: Secondary | ICD-10-CM | POA: Diagnosis not present

## 2018-01-09 NOTE — Therapy (Signed)
[facility] [facility] [address] [place], [address] Phone: [phone]   Fax:  [phone]  Physical Therapy Treatment  Patient Details  Name: Bridget [name] MRN: [id number] Date of Birth: 07-Sep-1956 Referring Provider: Thornton Park MD   Encounter Date: 01/08/2018  PT End of Session - 01/08/18 1300    Visit Number  13    Number of Visits  24    Date for PT Re-Evaluation  03/05/18    PT Start Time  1150    PT Stop Time  1233    PT Time Calculation (min)  43 min    Activity Tolerance  Patient tolerated treatment well;Patient limited by pain    Behavior During Therapy  [facility] for tasks assessed/performed       Past Medical History:  Diagnosis Date  . 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 Cancer (HCC)    BASAL CELL  . Chronic back pain   . GERD (gastroesophageal reflux disease)    OCC    Past Surgical History:  Procedure Laterality Date  . ANKLE SURGERY Right   . CESAREAN SECTION  [phone]   hodp pre term labor CS  . OVARIAN CYST SURGERY  1973  . SHOULDER ARTHROSCOPY Right 10/31/2017   Procedure: ARTHROSCOPY SHOULDER WITH LYSIS OF ADHESION, SUBACROMINAL DECOMPRESSION,DISTAL CLAVICLE EXCISION.;  Surgeon: Thornton Park, MD;  Location: [facility];  Service: Orthopedics;  Laterality: Right;  SHOULDER ARTHROSCOPY WITH LYSIS AND RESECTION OF ADHESIONS  . SHOULDER ARTHROSCOPY WITH OPEN ROTATOR CUFF REPAIR AND DISTAL CLAVICLE ACROMINECTOMY Right 03/19/2017   Procedure: SHOULDER ARTHROSCOPY WITH OPEN ROTATOR CUFF REPAIR AND SUBACROMINAL DECOMPRESSION;  Surgeon: Thornton Park, MD;  Location: [facility];  Service: Orthopedics;  Laterality: Right;  . TUBAL LIGATION  1999  . WRIST GANGLION EXCISION      There were no vitals filed for this visit.  Subjective Assessment - 01/08/18 1234    Subjective  Patient reports she is doing better and was seen by MD and is to work on IR behind back. She is leaving on a  trip for 2 weeks.     Pertinent History  May 2017 lifted a case of water and felt a pain in right shoulder. She had PT prior to surgery without lasting results and then had MRI and underwent RTC repair 03/19/2017. She also has back pain.     Limitations  Lifting;House hold activities;Other (comment) work related activities, overhead motion    Patient Stated Goals  return to full functional use right UE to return to prior level of function    Currently in Pain?  No/denies         Objective:  PROM right shoulder160 forward elevation; ER WNL; IR 60 degrees Palpation:moderatespasm and tenderness in right upper trapeziusand along medial border right scapulaand rotator cuff musculature Outcome measure: QuickDash 40% impairment  Treatment: Modalities: [place] pulsed50%1MHz1.1w/cm2 right upper trapezius and superior shoulder regionx10 min.for decreasing pain, spasms withresult of decreased spasms and tenderness >50%  Manual therapy:10 min. Goal: spasms, improve flexiblity STM superficial and compression techniques performed to right shoulder complex, upper trapezius and along medial border of scapula with patient supine and side lying  Therapeutic exercise:patient performed with assistance of therapist with verbal and tactile cues: goals: improve Quick Dash, ROM Supine:  PROM/AAROMright shoulder forward elevation with patient performing with guidance for correct alignment of shoulder through 160 degrees with mild discomfort end range ER  AAROM WNL x 10 reps without  discomfort IR x 10 with discomfort end range Side lying left:  IR behind back x 5 with stretch with 2# weight and 1# weight Standing :  IR/ER behind back pass object behind back CW/CCW x 10   Patient response to treatment:Patient demonstrated improved flexibility right shoulder following STM and modalities. She required repeated demonstration and repetition to improve technique with rotation exercises.    [facility]  PT Assessment - 01/09/18 0001      Assessment   Medical Diagnosis  right shoulder surgery, biceps tenodesis    Referring Provider  Thornton Park MD    Onset Date/Surgical Date  10/31/17    Hand Dominance  Right      Observation/Other Assessments   Quick DASH   40%        PT Education - 01/08/18 1300    Education provided  Yes    Education Details  exercise instruction for IR behind back in standing, side lying     Person(s) Educated  Patient    Methods  Explanation;Demonstration;Verbal cues    Comprehension  Verbalized understanding;Returned demonstration;Verbal cues required          PT Long Term Goals - 01/08/18 1500      PT LONG TERM GOAL #1   Title  Patient will demonstrate improved functional use right UE with decreased pain with QuickDash score of 50% or less     Baseline  QuickDash 80%; 12/25/17 48%    Status  Achieved      PT LONG TERM GOAL #2   Title  Patient will be independent with home program for pain control, flexibiltiy and strength to allow self management following discharge from physical therapy     Baseline  limited knowlege of appropriate pain control strategies, exercises and progression without moderate assistance    Status  Revised    Target Date  03/05/18      PT LONG TERM GOAL #3   Title  Patient will be able to perform household work/ chores without increase in symptoms.    Baseline  unable to perform household chores due to recent surgery; partial achievement, requires further strengthening    Status  Revised    Target Date  03/05/18      PT LONG TERM GOAL #4   Title  Patient will demonstrate improved functional use right UE with decreased pain to mild with QuickDash score of 20% or less     Baseline  80%; 12/25/17 48%; 01/08/18 40%    Status  Revised    Target Date  03/05/18            Plan - 01/08/18 1308    Clinical Impression Statement  Patient is progressing well with improving ROM right shoulder with less pain. She continues  with limited ROM right shoulder forward elevation and IR behind back with pain end ranges and decreased strength right shoulder that limits full functional use with all household and personal care tasks. She will benefit from continued physical therapy intervention to further strengthen and regain full ROM to allow return to prior level of function.      Rehab Potential  Good    Clinical Impairments Affecting Rehab Potential  (+)acute condition, motivated, prior level of function (-) chronic condition prior to surgery    PT Frequency  1x / week    PT Duration  8 weeks    PT Treatment/Interventions  Patient/family education;Electrical Stimulation;Cryotherapy;Moist Heat;Neuromuscular re-education;Therapeutic exercise;Manual techniques;Ultrasound    PT Next Visit Plan  continue  with stabilization, muscle re education, ROM exercises right shoulder     PT Home Exercise Plan  scapular retraction, upper trapezius stretches, supine AAROM and side lying exercises; self thoracic spine mobilization/stretching, AAROM standing at wall     Consulted and Agree with Plan of Care  Patient       Patient will benefit from skilled therapeutic intervention in order to improve the following deficits and impairments:  Decreased strength, Pain, Impaired perceived functional ability, Decreased activity tolerance, Decreased endurance, Increased muscle spasms, Impaired UE functional use, Decreased range of motion  Visit Diagnosis: Muscle weakness (generalized) - Plan: PT plan of care cert/re-cert  Stiffness of right shoulder, not elsewhere classified - Plan: PT plan of care cert/re-cert  Other muscle spasm - Plan: PT plan of care cert/re-cert     Problem List Patient Active Problem List   Diagnosis Date Noted  . Pre-operative examination 02/21/2017  . Low back pain 06/11/2016  . Osteopenia 09/30/2013  . Encounter for routine gynecological examination 01/18/2012  . Routine general medical examination at a health  care facility 01/09/2012  . [place], [place] 02/06/2010  . DEGENERATIVE JOINT DISEASE, MILD 07/19/2009    Jomarie Longs PT 01/09/2018, 7:02 PM  [facility] [address], [place], [address] Phone: [phone]   Fax:  [phone]  Name: Bridget [name] MRN: [id number] Date of Birth: [birth year]

## 2018-01-13 ENCOUNTER — Encounter: Payer: 59 | Admitting: Physical Therapy

## 2018-01-15 ENCOUNTER — Encounter: Payer: 59 | Admitting: Physical Therapy

## 2018-01-20 ENCOUNTER — Encounter: Payer: 59 | Admitting: Physical Therapy

## 2018-01-22 ENCOUNTER — Encounter: Payer: 59 | Admitting: Physical Therapy

## 2018-01-22 ENCOUNTER — Encounter: Payer: Self-pay | Admitting: Physical Therapy

## 2018-01-22 ENCOUNTER — Ambulatory Visit: Payer: 59 | Admitting: Physical Therapy

## 2018-01-22 DIAGNOSIS — M62838 Other muscle spasm: Secondary | ICD-10-CM | POA: Diagnosis not present

## 2018-01-22 DIAGNOSIS — M25611 Stiffness of right shoulder, not elsewhere classified: Secondary | ICD-10-CM

## 2018-01-22 DIAGNOSIS — M25511 Pain in right shoulder: Secondary | ICD-10-CM

## 2018-01-22 DIAGNOSIS — M6281 Muscle weakness (generalized): Secondary | ICD-10-CM | POA: Diagnosis not present

## 2018-01-22 NOTE — Therapy (Signed)
[facility] [address] [place], [address] Phone: [phone]   Fax:  [phone]  Physical Therapy Treatment  Patient Details  Name: Bridget [name] MRN: [id number] Date of Birth: 1956-08-23 Referring Provider: Thornton Park MD   Encounter Date: 01/22/2018  PT End of Session - 01/22/18 0920    Visit Number  14    Number of Visits  24    Date for PT Re-Evaluation  03/05/18    PT Start Time  0910    PT Stop Time  0953    PT Time Calculation (min)  43 min    Activity Tolerance  Patient tolerated treatment well;Patient limited by pain    Behavior During Therapy  [facility] for tasks assessed/performed       Past Medical History:  Diagnosis Date  . 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 Cancer (HCC)    BASAL CELL  . Chronic back pain   . GERD (gastroesophageal reflux disease)    OCC    Past Surgical History:  Procedure Laterality Date  . ANKLE SURGERY Right   . CESAREAN SECTION  [phone]   hodp pre term labor CS  . OVARIAN CYST SURGERY  1973  . SHOULDER ARTHROSCOPY Right 10/31/2017   Procedure: ARTHROSCOPY SHOULDER WITH LYSIS OF ADHESION, SUBACROMINAL DECOMPRESSION,DISTAL CLAVICLE EXCISION.;  Surgeon: Thornton Park, MD;  Location: [facility];  Service: Orthopedics;  Laterality: Right;  SHOULDER ARTHROSCOPY WITH LYSIS AND RESECTION OF ADHESIONS  . SHOULDER ARTHROSCOPY WITH OPEN ROTATOR CUFF REPAIR AND DISTAL CLAVICLE ACROMINECTOMY Right 03/19/2017   Procedure: SHOULDER ARTHROSCOPY WITH OPEN ROTATOR CUFF REPAIR AND SUBACROMINAL DECOMPRESSION;  Surgeon: Thornton Park, MD;  Location: [facility];  Service: Orthopedics;  Laterality: Right;  . TUBAL LIGATION  1999  . WRIST GANGLION EXCISION      There were no vitals filed for this visit.  Subjective Assessment - 01/22/18 0915    Subjective  Patient reports chief concern is soreness in right shoulder superiorly. She is back from trip and did well in  warmer weather and being able to move and not sit as much.  She continues with stiffness with rotation behind back   Pertinent History  May 2017 lifted a case of water and felt a pain in right shoulder. She had PT prior to surgery without lasting results and then had MRI and underwent RTC repair 03/19/2017. She also has back pain.     Limitations  Lifting;House hold activities;Other (comment) work related activities, overhead motion    Patient Stated Goals  return to full functional use right UE to return to prior level of function    Currently in Pain?  Yes    Pain Score  2     Pain Location  Shoulder    Pain Orientation  Right    Pain Descriptors / Indicators  Discomfort;Dull    Pain Type  Surgical pain 10/31/17    Pain Onset  More than a month ago    Pain Frequency  Constant         Objective:  PROM right shoulder165 forward elevation; ER WNL; IR 60 degrees; functional ROM IR behind band to hip pocket with pain, stiffness Palpation:moderatespasm and tenderness in right upper trapeziusand along medial border right scapulaand rotator cuff musculature posterior aspect of shoulder   Treatment: Modalities: [place] pulsed50%3MHz1.2w/cm2 right upper trapezius and superior shoulder regionx10 min.for decreasing pain, spasms   Manual therapy:6min. Goal: spasms, improve flexiblity STM superficial  and compression techniques performed to right shoulder complex, upper trapezius and along medial border of scapula with patient supine and side lying Posterior glide grade 3 x 10 reps right shoulder GHJ with patient supine lying  Therapeutic exercise:patient performed with assistance of therapist with verbal and tactile cues: goals: improve Quick Dash, ROM Supine:  PROM/AAROMright shoulder forward elevation with patient performing with guidance for correct alignment of shoulder through full ROM, mild discomfort end range IR x 10 with discomfort end range  Side lying left:  IR behind  back right shoulder  with stretch manually by therapist 3 x 20 seconds Right shoulder abduction x 10 with elbow 90  Degrees flexion   Patient response to treatment:Patient with decreased spasms and tenderness by 30% following [place] and STM. Improved IR with less stiffness in shoulder following joint mobilization and repetition with IR assisted.     PT Education - 01/22/18 0919    Education provided  Yes    Education Details  exercise instruction for technique    Person(s) Educated  Patient    Methods  Explanation;Demonstration    Comprehension  Verbal cues required;Returned demonstration;Verbalized understanding          PT Long Term Goals - 01/08/18 1500      PT LONG TERM GOAL #1   Title  Patient will demonstrate improved functional use right UE with decreased pain with QuickDash score of 50% or less     Baseline  QuickDash 80%; 12/25/17 48%    Status  Achieved      PT LONG TERM GOAL #2   Title  Patient will be independent with home program for pain control, flexibiltiy and strength to allow self management following discharge from physical therapy     Baseline  limited knowlege of appropriate pain control strategies, exercises and progression without moderate assistance    Status  Revised    Target Date  03/05/18      PT LONG TERM GOAL #3   Title  Patient will be able to perform household work/ chores without increase in symptoms.    Baseline  unable to perform household chores due to recent surgery; partial achievement, requires further strengthening    Status  Revised    Target Date  03/05/18      PT LONG TERM GOAL #4   Title  Patient will demonstrate improved functional use right UE with decreased pain to mild with QuickDash score of 20% or less     Baseline  80%; 12/25/17 48%; 01/08/18 40%    Status  Revised    Target Date  03/05/18            Plan - 01/22/18 1014    Clinical Impression Statement  Patient continues with limited IR right shoulder as primary  limitation to full function with right shoulder for daily tasks. She has spasms, decreased capsular mobility posteriorly and inferiorly right shoulder and decreased strength and will benefit from continued physical therapy intervention to achieve goals and return to prior level of function.     Rehab Potential  Good    Clinical Impairments Affecting Rehab Potential  (+)acute condition, motivated, prior level of function (-) chronic condition prior to surgery    PT Frequency  1x / week    PT Duration  8 weeks    PT Treatment/Interventions  Patient/family education;Electrical Stimulation;Cryotherapy;Moist Heat;Neuromuscular re-education;Therapeutic exercise;Manual techniques;Ultrasound    PT Next Visit Plan  continue with stabilization, muscle re education, ROM exercises right shoulder; [place], manual  techniques for soft tissue, joint     PT Home Exercise Plan  scapular retraction, upper trapezius stretches, supine AAROM and side lying exercises; self thoracic spine mobilization/stretching, AAROM standing at wall        Patient will benefit from skilled therapeutic intervention in order to improve the following deficits and impairments:  Decreased strength, Pain, Impaired perceived functional ability, Decreased activity tolerance, Decreased endurance, Increased muscle spasms, Impaired UE functional use, Decreased range of motion  Visit Diagnosis: Muscle weakness (generalized)  Stiffness of right shoulder, not elsewhere classified  Other muscle spasm  Acute pain of right shoulder     Problem List Patient Active Problem List   Diagnosis Date Noted  . Pre-operative examination 02/21/2017  . Low back pain 06/11/2016  . Osteopenia 09/30/2013  . Encounter for routine gynecological examination 01/18/2012  . Routine general medical examination at a health care facility 01/09/2012  . [place], [place] 02/06/2010  . DEGENERATIVE JOINT DISEASE, MILD 07/19/2009    Jomarie Longs PT 01/22/2018,  10:16 AM  [facility] [address], [place], [address] Phone: [phone]   Fax:  [phone]  Name: Bridget [name] MRN: [id number] Date of Birth: 1956-05-06

## 2018-01-25 HISTORY — PX: BREAST SURGERY: SHX581

## 2018-01-27 ENCOUNTER — Ambulatory Visit
Admission: RE | Admit: 2018-01-27 | Discharge: 2018-01-27 | Disposition: A | Discharge status: Home / Self Care | Payer: 59 | Source: Ambulatory Visit | Attending: Family Medicine | Admitting: Family Medicine

## 2018-01-27 DIAGNOSIS — Z1231 Encounter for screening mammogram for malignant neoplasm of breast: Secondary | ICD-10-CM

## 2018-01-29 ENCOUNTER — Other Ambulatory Visit: Payer: Self-pay | Admitting: Family Medicine

## 2018-01-29 ENCOUNTER — Encounter: Payer: Self-pay | Admitting: Physical Therapy

## 2018-01-29 ENCOUNTER — Ambulatory Visit: Payer: 59 | Attending: Orthopedic Surgery | Admitting: Physical Therapy

## 2018-01-29 DIAGNOSIS — M62838 Other muscle spasm: Secondary | ICD-10-CM

## 2018-01-29 DIAGNOSIS — M25611 Stiffness of right shoulder, not elsewhere classified: Secondary | ICD-10-CM

## 2018-01-29 DIAGNOSIS — M6281 Muscle weakness (generalized): Secondary | ICD-10-CM

## 2018-01-29 DIAGNOSIS — R928 Other abnormal and inconclusive findings on diagnostic imaging of breast: Secondary | ICD-10-CM

## 2018-01-29 NOTE — Therapy (Signed)
[facility] [address], [place], [address] Phone: [phone]   Fax:  [phone]  Physical Therapy Treatment  Patient Details  Name: Bridget [name] MRN: [id number] Date of Birth: [birth year] Referring Provider: Thornton Park MD   Encounter Date: 01/29/2018  PT End of Session - 01/29/18 1109    Visit Number  15    Number of Visits  24    Date for PT Re-Evaluation  03/05/18    PT Start Time  1018    PT Stop Time  1103    PT Time Calculation (min)  45 min    Activity Tolerance  Patient tolerated treatment well;Patient limited by pain    Behavior During Therapy  [facility] for tasks assessed/performed       Past Medical History:  Diagnosis Date  . 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 Cancer (HCC)    BASAL CELL  . Chronic back pain   . GERD (gastroesophageal reflux disease)    OCC    Past Surgical History:  Procedure Laterality Date  . ANKLE SURGERY Right   . CESAREAN SECTION  [phone]   hodp pre term labor CS  . OVARIAN CYST SURGERY  1973  . SHOULDER ARTHROSCOPY Right 10/31/2017   Procedure: ARTHROSCOPY SHOULDER WITH LYSIS OF ADHESION, SUBACROMINAL DECOMPRESSION,DISTAL CLAVICLE EXCISION.;  Surgeon: Thornton Park, MD;  Location: [facility];  Service: Orthopedics;  Laterality: Right;  SHOULDER ARTHROSCOPY WITH LYSIS AND RESECTION OF ADHESIONS  . SHOULDER ARTHROSCOPY WITH OPEN ROTATOR CUFF REPAIR AND DISTAL CLAVICLE ACROMINECTOMY Right 03/19/2017   Procedure: SHOULDER ARTHROSCOPY WITH OPEN ROTATOR CUFF REPAIR AND SUBACROMINAL DECOMPRESSION;  Surgeon: Thornton Park, MD;  Location: [facility];  Service: Orthopedics;  Laterality: Right;  . TUBAL LIGATION  1999  . WRIST GANGLION EXCISION      There were no vitals filed for this visit.  Subjective Assessment - 01/29/18 1022    Subjective  Patient reports she feels she is doing well with right shoulder.  She has pain and soreness in right upper arm.      Pertinent History  May 2017 lifted a case of water and felt a pain in right shoulder. She had PT prior to surgery without lasting results and then had MRI and underwent RTC repair 03/19/2017. She also has back pain.     Limitations  Lifting;House hold activities;Other (comment) work related activities, overhead motion    Patient Stated Goals  return to full functional use right UE to return to prior level of function    Currently in Pain?  Yes    Pain Score  2     Pain Location  Shoulder    Pain Orientation  Right    Pain Descriptors / Indicators  Discomfort;Dull    Pain Type  Surgical pain 10/31/2017    Pain Onset  More than a month ago    Pain Frequency  Constant         Objective:  PROM right shoulder165 forward elevation; ER WNL; IR 60 degrees; functional ROM IR behind band to hip pocket with pain, stiffness Palpation:moderatespasm and tenderness in right upper trapeziusand along medial border right scapulaand rotator cuff musculature posterior aspect of shoulder   Treatment: Modalities: [place] pulsed50%3MHz1.2w/cm2 right upper trapezius and superior shoulder regionx10 min.for decreasing pain, spasms   Manual therapy:32min. Goal: spasms, improve flexibility/ROM  STM superficial and compression techniques performed to right shoulder complex, upper trapezius and along medial border of scapulawith  patient supine and side lying Posterior glide grade 3 x 10 reps right shoulder GHJ with patient supine lying/side lying   Therapeutic exercise:patient performed with assistance of therapist with verbal and tactile cues: goals: improve Quick Dash, ROM Supine: PROM/AAROMright shoulder forward elevation with patient performing with guidance for correct alignment of shoulderthrough full ROM, mild discomfort end range IR x 10 with discomfort end range Isometric IR multi angle into more IR each repetition x 5   Side lying left:  IR behind back right shoulder  with  stretch manually by therapist 3 x 20 seconds Right shoulder abduction x 10 with elbow 90  Degrees flexion  Standing:  IR behind back with object CW/CCW x 10 reps   Patient response to treatment:Patient demonstrated decreased spasms, improved IR behind back with less difficulty following treatment.         PT Education - 01/29/18 1030    Education provided  Yes    Education Details  exercise instruction for technique, alignment     Person(s) Educated  Patient    Methods  Explanation;Demonstration;Verbal cues    Comprehension  Verbal cues required;Returned demonstration;Verbalized understanding          PT Long Term Goals - 01/08/18 1500      PT LONG TERM GOAL #1   Title  Patient will demonstrate improved functional use right UE with decreased pain with QuickDash score of 50% or less     Baseline  QuickDash 80%; 12/25/17 48%    Status  Achieved      PT LONG TERM GOAL #2   Title  Patient will be independent with home program for pain control, flexibiltiy and strength to allow self management following discharge from physical therapy     Baseline  limited knowlege of appropriate pain control strategies, exercises and progression without moderate assistance    Status  Revised    Target Date  03/05/18      PT LONG TERM GOAL #3   Title  Patient will be able to perform household work/ chores without increase in symptoms.    Baseline  unable to perform household chores due to recent surgery; partial achievement, requires further strengthening    Status  Revised    Target Date  03/05/18      PT LONG TERM GOAL #4   Title  Patient will demonstrate improved functional use right UE with decreased pain to mild with QuickDash score of 20% or less     Baseline  80%; 12/25/17 48%; 01/08/18 40%    Status  Revised    Target Date  03/05/18            Plan - 01/29/18 1650    Clinical Impression Statement  Patient continues with limited IR right shoulder as primary limtiation to  full function with right shoulder for daily tasks. She is demosntrating steady progress towards goals and should continue to progress with additional physical therapy intervention.     Rehab Potential  Good    Clinical Impairments Affecting Rehab Potential  (+)acute condition, motivated, prior level of function (-) chronic condition prior to surgery    PT Frequency  1x / week    PT Duration  8 weeks    PT Treatment/Interventions  Patient/family education;Electrical Stimulation;Cryotherapy;Moist Heat;Neuromuscular re-education;Therapeutic exercise;Manual techniques;Ultrasound    PT Next Visit Plan  continue with stabilization, muscle re education, ROM exercises right shoulder; [place], manual techniques for soft tissue, joint     PT Home Exercise Plan  scapular  retraction, upper trapezius stretches, supine AAROM and side lying exercises; self thoracic spine mobilization/stretching, AAROM standing at wall        Patient will benefit from skilled therapeutic intervention in order to improve the following deficits and impairments:  Decreased strength, Pain, Impaired perceived functional ability, Decreased activity tolerance, Decreased endurance, Increased muscle spasms, Impaired UE functional use, Decreased range of motion  Visit Diagnosis: Muscle weakness (generalized)  Stiffness of right shoulder, not elsewhere classified  Other muscle spasm     Problem List Patient Active Problem List   Diagnosis Date Noted  . Pre-operative examination 02/21/2017  . Low back pain 06/11/2016  . Osteopenia 09/30/2013  . Encounter for routine gynecological examination 01/18/2012  . Routine general medical examination at a health care facility 01/09/2012  . [place], [place] 02/06/2010  . DEGENERATIVE JOINT DISEASE, MILD 07/19/2009    Jomarie Longs PT 01/30/2018, 2:29 PM   [facility] [address], [place], [address] Phone:  [phone]   Fax:  [phone]  Name: Bridget [name] MRN: [id number] Date of Birth: 12-10-1956

## 2018-02-03 ENCOUNTER — Ambulatory Visit
Admission: RE | Admit: 2018-02-03 | Discharge: 2018-02-03 | Disposition: A | Discharge status: Home / Self Care | Payer: 59 | Source: Ambulatory Visit | Attending: Family Medicine | Admitting: Family Medicine

## 2018-02-03 ENCOUNTER — Other Ambulatory Visit: Payer: Self-pay | Admitting: Family Medicine

## 2018-02-03 DIAGNOSIS — R921 Mammographic calcification found on diagnostic imaging of breast: Secondary | ICD-10-CM | POA: Diagnosis not present

## 2018-02-03 DIAGNOSIS — N6011 Diffuse cystic mastopathy of right breast: Secondary | ICD-10-CM | POA: Diagnosis not present

## 2018-02-03 DIAGNOSIS — R928 Other abnormal and inconclusive findings on diagnostic imaging of breast: Secondary | ICD-10-CM

## 2018-02-05 ENCOUNTER — Encounter: Payer: Self-pay | Admitting: Physical Therapy

## 2018-02-05 ENCOUNTER — Ambulatory Visit: Payer: 59 | Admitting: Physical Therapy

## 2018-02-05 DIAGNOSIS — M6281 Muscle weakness (generalized): Secondary | ICD-10-CM | POA: Diagnosis not present

## 2018-02-05 DIAGNOSIS — M62838 Other muscle spasm: Secondary | ICD-10-CM | POA: Diagnosis not present

## 2018-02-05 DIAGNOSIS — M25611 Stiffness of right shoulder, not elsewhere classified: Secondary | ICD-10-CM

## 2018-02-05 HISTORY — PX: BREAST BIOPSY: SHX20

## 2018-02-05 NOTE — Therapy (Signed)
[facility] [address] [place], [address] Phone: [phone]   Fax:  [phone]  Physical Therapy Treatment  Patient Details  Name: Bridget [name] MRN: [id number] Date of Birth: 1956/06/18 Referring Provider: Thornton Park MD   Encounter Date: 02/05/2018  PT End of Session - 02/05/18 1027    Visit Number  16    Number of Visits  24    Date for PT Re-Evaluation  03/05/18    PT Start Time  1017    PT Stop Time  1117    PT Time Calculation (min)  60 min    Activity Tolerance  Patient tolerated treatment well;Patient limited by pain    Behavior During Therapy  [facility] for tasks assessed/performed       Past Medical History:  Diagnosis Date  . 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 Cancer (HCC)    BASAL CELL  . Chronic back pain   . GERD (gastroesophageal reflux disease)    OCC    Past Surgical History:  Procedure Laterality Date  . ANKLE SURGERY Right   . CESAREAN SECTION  [phone]   hodp pre term labor CS  . OVARIAN CYST SURGERY  1973  . SHOULDER ARTHROSCOPY Right 10/31/2017   Procedure: ARTHROSCOPY SHOULDER WITH LYSIS OF ADHESION, SUBACROMINAL DECOMPRESSION,DISTAL CLAVICLE EXCISION.;  Surgeon: Thornton Park, MD;  Location: [facility];  Service: Orthopedics;  Laterality: Right;  SHOULDER ARTHROSCOPY WITH LYSIS AND RESECTION OF ADHESIONS  . SHOULDER ARTHROSCOPY WITH OPEN ROTATOR CUFF REPAIR AND DISTAL CLAVICLE ACROMINECTOMY Right 03/19/2017   Procedure: SHOULDER ARTHROSCOPY WITH OPEN ROTATOR CUFF REPAIR AND SUBACROMINAL DECOMPRESSION;  Surgeon: Thornton Park, MD;  Location: [facility];  Service: Orthopedics;  Laterality: Right;  . TUBAL LIGATION  1999  . WRIST GANGLION EXCISION      There were no vitals filed for this visit.  Subjective Assessment - 02/05/18 1023    Subjective  Patient reports she is doing well with ROM and decreasing stiffness and pain.     Pertinent History  May 2017  lifted a case of water and felt a pain in right shoulder. She had PT prior to surgery without lasting results and then had MRI and underwent RTC repair 03/19/2017. She also has back pain.     Limitations  Lifting;House hold activities;Other (comment) work related activities, overhead motion    Patient Stated Goals  return to full functional use right UE to return to prior level of function    Currently in Pain?  Yes    Pain Score  2     Pain Orientation  Right    Pain Descriptors / Indicators  Aching;Discomfort    Pain Type  Surgical pain 10/31/17    Pain Onset  More than a month ago    Pain Frequency  Constant         Objective:  AAROM: right shoulder supine: WNL >165 with mild discomfort end range; IR 60 pre treatment and 70 degrees post treatment; ER: full pain free motion Palpation:moderatespasm and tenderness in right upper trapeziusand along medial border right scapulaand rotator cuff musculatureposterior aspect of shoulder  Treatment: Modalities: [place] pulsed50%3MHz1.2w/cm2 right upper trapezius and superior shoulder regionx10 min with patient seated in chair with right UE supported: goal: decrease pain, spasms   Manual therapy:68min. Goal: spasms, improve flexibility/ROM  STM superficial and compression techniques performed to right shoulder complex, upper trapezius and along medial border of scapulawith patient supine and  side lying  Therapeutic exercise:patient performed with assistance of therapist with verbal and tactile cues: goals: improve Quick Dash, ROM Supine: PROM/AAROMright shoulder forward elevation with patient performing with guidance for correct alignment of shoulderthroughfull ROM,mild discomfort end range IR x 10 with discomfort end range Isometric IR to facilitate increased end range motion x 10 reps, 5 second holds (improved IR ROM from 60 to 70 degrees) Serratus punches x 10 reps with decreased pain reported in shoulder with each  repetition  Side lying left: IR behind back right shoulder with stretch manually by therapist 2 reps 10 seconds Right shoulder abduction x 10 with elbow 90 Degrees flexion  Moist heat applied to right shoulder at end of session for spasms/pain x 10 min. ; no adverse reactions noted  Patient response to treatment: patient demonstrated improved technique with exercises with minimal VC for correct alignment. Improved ROM into IR to 70 degrees with strengthening and serratus anterior control exercises. Patient with decreased spasms by 50% following STM.     PT Education - 02/05/18 1024    Education provided  Yes    Education Details  exercise instruction: added serratus anterior    Person(s) Educated  Patient    Methods  Explanation;Demonstration;Verbal cues    Comprehension  Verbalized understanding;Returned demonstration;Verbal cues required          PT Long Term Goals - 01/08/18 1500      PT LONG TERM GOAL #1   Title  Patient will demonstrate improved functional use right UE with decreased pain with QuickDash score of 50% or less     Baseline  QuickDash 80%; 12/25/17 48%    Status  Achieved      PT LONG TERM GOAL #2   Title  Patient will be independent with home program for pain control, flexibiltiy and strength to allow self management following discharge from physical therapy     Baseline  limited knowlege of appropriate pain control strategies, exercises and progression without moderate assistance    Status  Revised    Target Date  03/05/18      PT LONG TERM GOAL #3   Title  Patient will be able to perform household work/ chores without increase in symptoms.    Baseline  unable to perform household chores due to recent surgery; partial achievement, requires further strengthening    Status  Revised    Target Date  03/05/18      PT LONG TERM GOAL #4   Title  Patient will demonstrate improved functional use right UE with decreased pain to mild with QuickDash score of  20% or less     Baseline  80%; 12/25/17 48%; 01/08/18 40%    Status  Revised    Target Date  03/05/18            Plan - 02/05/18 1128    Clinical Impression Statement  Pateint is progressing well with improvement noted in IR and strength right shoulder and full ROM with forward elevation and ER. She is progressing steadily towards goals and will benefit from continued physical therapy intervention.     Rehab Potential  Good    Clinical Impairments Affecting Rehab Potential  (+)acute condition, motivated, prior level of function (-) chronic condition prior to surgery    PT Frequency  1x / week    PT Duration  8 weeks    PT Treatment/Interventions  Patient/family education;Electrical Stimulation;Cryotherapy;Moist Heat;Neuromuscular re-education;Therapeutic exercise;Manual techniques;Ultrasound    PT Next Visit Plan  continue with stabilization,  muscle re education, ROM exercises right shoulder; [place], manual techniques for soft tissue, joint     PT Home Exercise Plan  scapular retraction, upper trapezius stretches, supine AAROM and side lying exercises; self thoracic spine mobilization/stretching, AAROM standing at wall        Patient will benefit from skilled therapeutic intervention in order to improve the following deficits and impairments:  Decreased strength, Pain, Impaired perceived functional ability, Decreased activity tolerance, Decreased endurance, Increased muscle spasms, Impaired UE functional use, Decreased range of motion  Visit Diagnosis: Muscle weakness (generalized)  Stiffness of right shoulder, not elsewhere classified  Other muscle spasm     Problem List Patient Active Problem List   Diagnosis Date Noted  . Pre-operative examination 02/21/2017  . Low back pain 06/11/2016  . Osteopenia 09/30/2013  . Encounter for routine gynecological examination 01/18/2012  . Routine general medical examination at a health care facility 01/09/2012  . [place], [place]  02/06/2010  . DEGENERATIVE JOINT DISEASE, MILD 07/19/2009    Jomarie Longs PT 02/05/2018, 11:37 AM  [facility] [address], [place], [address] Phone: [phone]   Fax:  [phone]  Name: Bridget [name] MRN: [id number] Date of Birth: 1956-08-06

## 2018-02-12 ENCOUNTER — Encounter: Payer: Self-pay | Admitting: Physical Therapy

## 2018-02-12 ENCOUNTER — Ambulatory Visit: Payer: 59 | Admitting: Physical Therapy

## 2018-02-12 DIAGNOSIS — M25611 Stiffness of right shoulder, not elsewhere classified: Secondary | ICD-10-CM | POA: Diagnosis not present

## 2018-02-12 DIAGNOSIS — M62838 Other muscle spasm: Secondary | ICD-10-CM

## 2018-02-12 DIAGNOSIS — M6281 Muscle weakness (generalized): Secondary | ICD-10-CM

## 2018-02-12 NOTE — Therapy (Signed)
[facility] [address], [place], [address] Phone: [phone]   Fax:  [phone]  Physical Therapy Treatment/Discharge Summary  Patient Details  Name: Bridget [name] MRN: [id number] Date of Birth: Aug 20, 1956 Referring Provider: Thornton Park MD   Encounter Date: 02/12/2018   Patient began physical therapy on 11/11/2017  and has attended 17 sessions through.02/12/2018. She has achieved/partially met all goals and is independent in home program for continued self management of pain/symptoms and exercises as instructed. Plan discharge from physical therapy at this time.    PT End of Session - 02/12/18 1603    Visit Number  17    Number of Visits  24    Date for PT Re-Evaluation  03/05/18    PT Start Time  9892    PT Stop Time  1630    PT Time Calculation (min)  38 min    Activity Tolerance  Patient tolerated treatment well;Patient limited by pain    Behavior During Therapy  [facility] for tasks assessed/performed       Past Medical History:  Diagnosis Date  . 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 Cancer (HCC)    BASAL CELL  . Chronic back pain   . GERD (gastroesophageal reflux disease)    OCC    Past Surgical History:  Procedure Laterality Date  . ANKLE SURGERY Right   . CESAREAN SECTION  [phone]   hodp pre term labor CS  . OVARIAN CYST SURGERY  1973  . SHOULDER ARTHROSCOPY Right 10/31/2017   Procedure: ARTHROSCOPY SHOULDER WITH LYSIS OF ADHESION, SUBACROMINAL DECOMPRESSION,DISTAL CLAVICLE EXCISION.;  Surgeon: Thornton Park, MD;  Location: [facility] ORS;  Service: Orthopedics;  Laterality: Right;  SHOULDER ARTHROSCOPY WITH LYSIS AND RESECTION OF ADHESIONS  . SHOULDER ARTHROSCOPY WITH OPEN ROTATOR CUFF REPAIR AND DISTAL CLAVICLE ACROMINECTOMY Right 03/19/2017   Procedure: SHOULDER ARTHROSCOPY WITH OPEN ROTATOR CUFF REPAIR AND SUBACROMINAL DECOMPRESSION;  Surgeon: Thornton Park, MD;  Location:  [facility] ORS;  Service: Orthopedics;  Laterality: Right;  . TUBAL LIGATION  1999  . WRIST GANGLION EXCISION      There were no vitals filed for this visit.  Subjective Assessment - 02/12/18 1559    Subjective  Patient reports she is exercising at home and is seeing steady improvement and would like to continue independently with self managment.     Pertinent History  May 2017 lifted a case of water and felt a pain in right shoulder. She had PT prior to surgery without lasting results and then had MRI and underwent RTC repair 03/19/2017. She also has back pain.     Limitations  Lifting;House hold activities;Other (comment) work related activities, overhead motion    Patient Stated Goals  return to full functional use right UE to return to prior level of function    Currently in Pain?  Yes    Pain Score  1     Pain Location  Shoulder    Pain Orientation  Right    Pain Descriptors / Indicators  Aching;Discomfort    Pain Type  Surgical pain 10/31/2017    Pain Onset  More than a month ago    Pain Frequency  Constant           Objective:  Outcome Measure: Quick Dash score: 29% (initially was 80%) AAROM: right shoulder supine: WNL >165 with mild discomfort end range; IR 60 degrees; ER: full pain free motion in plane of scapula Palpation:moderatespasm and  tenderness in right upper trapeziusand along medial border right scapulaand rotator cuff musculatureposterior aspect of shoulder  Treatment: Manual therapy:70mn. Goal: spasms, improveflexibility/ROM STM superficial and compression techniques performed to right shoulder complex, upper trapezius and along medial border of scapulawith patient supine and side lying  Therapeutic exercise:patient performed with assistance of therapist with verbal and tactile cues: goals: improve Quick Dash, ROM Supine: PROM/AAROMright shoulder forward elevation with patient performing with guidance for correct alignment of shoulderthroughfull  ROM,mild discomfort end range IR x 10 with discomfort end range Isometric IR to facilitate increased end range motion x 10 reps, 5 second holds (improved IR ROM from 60 to 70 degrees) Instructed in throwers ten program and performed scapular row prone with 2-3# weight x 10 reps, horizontal abduction without weight x 10 Prone shoulder extension no weight x 10 Forearm exercises ROM/stretch into ER with shoulder abduction to 90 degrees with pillow behind UE for support   Patient response to treatment: Improved technique with exercises with visual, verbal cues. Improved soft tissue mobility/elasticity following STM.       PT Education - 02/12/18 1603    Education provided  Yes    Education Details  exercise instruction and re assessed HEP    Person(s) Educated  Patient    Methods  Explanation;Demonstration;Verbal cues; handout   Comprehension  Verbalized understanding;Returned demonstration;Verbal cues required          PT Long Term Goals - 02/12/18 1936      PT LONG TERM GOAL #1   Title  Patient will demonstrate improved functional use right UE with decreased pain with QuickDash score of 50% or less     Baseline  QuickDash 80%; 12/25/17 48%    Status  Achieved      PT LONG TERM GOAL #2   Title  Patient will be independent with home program for pain control, flexibiltiy and strength to allow self management following discharge from physical therapy     Baseline  limited knowlege of appropriate pain control strategies, exercises and progression without moderate assistance    Status  Achieved      PT LONG TERM GOAL #3   Title  Patient will be able to perform household work/ chores without increase in symptoms.    Baseline  unable to perform household chores due to recent surgery; partial achievement, requires further strengthening    Status  Partially Met      PT LONG TERM GOAL #4   Title  Patient will demonstrate improved functional use right UE with decreased pain to mild  with QuickDash score of 20% or less     Baseline  80%; 12/25/17 48%; 01/08/18 40%; 02/12/18 29% (work module 6.25%)    Status  Partially Met            Plan - 02/12/18 1934    Clinical Impression Statement  Patient is independent with home program, progressing with ROM and strength. She has Quick Dash score of 29%, work module 6.25% and is still not able to play tennis. She should continue to improve with HEP. Plan discharge from physical therapy at this time.     Rehab Potential  Good    Clinical Impairments Affecting Rehab Potential  (+)acute condition, motivated, prior level of function (-) chronic condition prior to surgery    PT Frequency  1x / week    PT Duration  8 weeks    PT Treatment/Interventions  Patient/family education;Electrical Stimulation;Cryotherapy;Moist Heat;Neuromuscular re-education;Therapeutic exercise;Manual techniques;Ultrasound    PT Next  Visit Plan Discharge   PT Home Exercise Plan  scapular retraction, upper trapezius stretches, supine AAROM and side lying exercises; self thoracic spine mobilization/stretching, AAROM standing at wall        Patient will benefit from skilled therapeutic intervention in order to improve the following deficits and impairments:  Decreased strength, Pain, Impaired perceived functional ability, Decreased activity tolerance, Decreased endurance, Increased muscle spasms, Impaired UE functional use, Decreased range of motion  Visit Diagnosis: Muscle weakness (generalized)  Stiffness of right shoulder, not elsewhere classified  Other muscle spasm     Problem List Patient Active Problem List   Diagnosis Date Noted  . Pre-operative examination 02/21/2017  . Low back pain 06/11/2016  . Osteopenia 09/30/2013  . Encounter for routine gynecological examination 01/18/2012  . Routine general medical examination at a health care facility 01/09/2012  . [place], [place] 02/06/2010  . DEGENERATIVE JOINT DISEASE, MILD 07/19/2009     Jomarie Longs PT 02/13/2018, 7:38 PM  [place] [address], [place], [address] Phone: [phone]   Fax:  [phone]  Name: Bridget [name] MRN: [id number] Date of Birth: Feb 02, 1956

## 2018-02-19 ENCOUNTER — Ambulatory Visit: Payer: 59 | Admitting: Physical Therapy

## 2018-03-05 DIAGNOSIS — H5213 Myopia, bilateral: Secondary | ICD-10-CM | POA: Diagnosis not present

## 2018-03-14 DIAGNOSIS — L570 Actinic keratosis: Secondary | ICD-10-CM | POA: Diagnosis not present

## 2018-03-14 DIAGNOSIS — X32XXXA Exposure to sunlight, initial encounter: Secondary | ICD-10-CM | POA: Diagnosis not present

## 2018-03-14 DIAGNOSIS — D225 Melanocytic nevi of trunk: Secondary | ICD-10-CM | POA: Diagnosis not present

## 2018-03-14 DIAGNOSIS — Z08 Encounter for follow-up examination after completed treatment for malignant neoplasm: Secondary | ICD-10-CM | POA: Diagnosis not present

## 2018-03-14 DIAGNOSIS — Z85828 Personal history of other malignant neoplasm of skin: Secondary | ICD-10-CM | POA: Diagnosis not present

## 2018-03-21 DIAGNOSIS — M66821 Spontaneous rupture of other tendons, right upper arm: Secondary | ICD-10-CM | POA: Diagnosis not present

## 2018-03-21 DIAGNOSIS — M7541 Impingement syndrome of right shoulder: Secondary | ICD-10-CM | POA: Diagnosis not present

## 2018-03-21 DIAGNOSIS — M7501 Adhesive capsulitis of right shoulder: Secondary | ICD-10-CM | POA: Diagnosis not present

## 2018-04-16 ENCOUNTER — Telehealth: Payer: Self-pay | Admitting: Family Medicine

## 2018-04-16 DIAGNOSIS — Z0000 Encounter for general adult medical examination without abnormal findings: Secondary | ICD-10-CM

## 2018-04-16 NOTE — Telephone Encounter (Signed)
-----   Message from Lendon Collar, RT sent at 04/15/2018  2:06 PM EDT ----- Regarding: Lab orders for May 1st Please enter lab orders for Wed May 1st. Thanks-Lauren

## 2018-04-22 ENCOUNTER — Other Ambulatory Visit: Payer: 59

## 2018-04-23 ENCOUNTER — Other Ambulatory Visit: Payer: 59

## 2018-04-24 ENCOUNTER — Other Ambulatory Visit (INDEPENDENT_AMBULATORY_CARE_PROVIDER_SITE_OTHER): Payer: 59

## 2018-04-24 DIAGNOSIS — Z0000 Encounter for general adult medical examination without abnormal findings: Secondary | ICD-10-CM | POA: Diagnosis not present

## 2018-04-24 LAB — LIPID PANEL
CHOLESTEROL: 199 mg/dL (ref 0–200)
HDL: 56.9 mg/dL (ref >39.00)
LDL Cholesterol: 129 mg/dL — ABNORMAL HIGH (ref 0–99)
NONHDL: 142.23
TRIGLYCERIDES: 66 mg/dL (ref 0.0–149.0)
Total CHOL/HDL Ratio: 3
VLDL: 13.2 mg/dL (ref 0.0–40.0)

## 2018-04-24 LAB — CBC WITH DIFFERENTIAL/PLATELET
BASOS ABS: 0 10*3/uL (ref 0.0–0.1)
Basophils Relative: 0.8 % (ref 0.0–3.0)
Eosinophils Absolute: 0.2 10*3/uL (ref 0.0–0.7)
Eosinophils Relative: 4.1 % (ref 0.0–5.0)
HCT: 41.4 % (ref 36.0–46.0)
HEMOGLOBIN: 13.9 g/dL (ref 12.0–15.0)
Lymphocytes Relative: 29.1 % (ref 12.0–46.0)
Lymphs Abs: 1.6 10*3/uL (ref 0.7–4.0)
MCHC: 33.4 g/dL (ref 30.0–36.0)
MCV: 87.5 fl (ref 78.0–100.0)
MONOS PCT: 7.9 % (ref 3.0–12.0)
Monocytes Absolute: 0.4 10*3/uL (ref 0.1–1.0)
NEUTROS PCT: 58.1 % (ref 43.0–77.0)
Neutro Abs: 3.3 10*3/uL (ref 1.4–7.7)
Platelets: 302 10*3/uL (ref 150.0–400.0)
RBC: 4.73 Mil/uL (ref 3.87–5.11)
RDW: 14 % (ref 11.5–15.5)
WBC: 5.6 10*3/uL (ref 4.0–10.5)

## 2018-04-24 LAB — COMPREHENSIVE METABOLIC PANEL
ALBUMIN: 4.2 g/dL (ref 3.5–5.2)
ALK PHOS: 67 U/L (ref 39–117)
ALT: 13 U/L (ref 0–35)
AST: 19 U/L (ref 0–37)
BILIRUBIN TOTAL: 0.5 mg/dL (ref 0.2–1.2)
BUN: 19 mg/dL (ref 6–23)
CALCIUM: 9.1 mg/dL (ref 8.4–10.5)
CO2: 29 mEq/L (ref 19–32)
Chloride: 106 mEq/L (ref 96–112)
Creatinine, Ser: 0.71 mg/dL (ref 0.40–1.20)
GFR: 88.74 mL/min (ref >60.00)
GLUCOSE: 95 mg/dL (ref 70–99)
POTASSIUM: 3.9 meq/L (ref 3.5–5.1)
Sodium: 142 mEq/L (ref 135–145)
TOTAL PROTEIN: 6.9 g/dL (ref 6.0–8.3)

## 2018-04-24 LAB — TSH: TSH: 1.46 u[IU]/mL (ref 0.35–4.50)

## 2018-04-30 ENCOUNTER — Encounter: Payer: Self-pay | Admitting: Family Medicine

## 2018-04-30 ENCOUNTER — Ambulatory Visit (INDEPENDENT_AMBULATORY_CARE_PROVIDER_SITE_OTHER): Payer: 59 | Admitting: Family Medicine

## 2018-04-30 VITALS — BP 108/70 | HR 76 | Temp 98.1°F | Ht 62.75 in | Wt 153.0 lb

## 2018-04-30 DIAGNOSIS — E785 Hyperlipidemia, unspecified: Secondary | ICD-10-CM | POA: Diagnosis not present

## 2018-04-30 DIAGNOSIS — E2839 Other primary ovarian failure: Secondary | ICD-10-CM | POA: Diagnosis not present

## 2018-04-30 DIAGNOSIS — M8580 Other specified disorders of bone density and structure, unspecified site: Secondary | ICD-10-CM

## 2018-04-30 DIAGNOSIS — Z0000 Encounter for general adult medical examination without abnormal findings: Secondary | ICD-10-CM

## 2018-04-30 NOTE — Progress Notes (Signed)
Subjective:    Patient ID: Bridget [name], female    DOB: 10/01/1956, 62 y.o.   MRN: [id number]  HPI Here for health maintenance exam and to review chronic medical problems    Doing well overall  Nothing new going on   Wt Readings from Last 3 Encounters:  04/30/18 153 lb (69.4 kg)  10/31/17 154 lb (69.9 kg)  10/18/17 154 lb (69.9 kg)  stable  Eats healthy  Exercises  27.32 kg/m   Flu vaccine 9/18  Mammogram 2/19 - biopsy-normal /fibrocystic  Self breast exam -no lumps   Pap 5/17 neg with neg HPV test  No gyn symptoms   Colonoscopy 10/10 with 10 y recall   Tetanus shot 5/17  Zoster status - never had shingles   dexa 10/14 osteopenia  Has started taking some vitamin D  No falls or fractures  Is getting shorter  Wants to re check    Cholesterol  Lab Results  Component Value Date   CHOL 199 04/24/2018   CHOL 190 04/23/2016   CHOL 178 09/21/2013   Lab Results  Component Value Date   HDL 56.90 04/24/2018   HDL 53.30 04/23/2016   HDL 50.70 09/21/2013   Lab Results  Component Value Date   LDLCALC 129 (H) 04/24/2018   LDLCALC 121 (H) 04/23/2016   LDLCALC 116 (H) 09/21/2013   Lab Results  Component Value Date   TRIG 66.0 04/24/2018   TRIG 74.0 04/23/2016   TRIG 55.0 09/21/2013   Lab Results  Component Value Date   CHOLHDL 3 04/24/2018   CHOLHDL 4 04/23/2016   CHOLHDL 4 09/21/2013   No results found for: LDLDIRECT Does not eat a lot of fatty foods  Other labs Results for orders placed or performed in visit on 04/24/18  TSH  Result Value Ref Range   TSH 1.46 0.35 - 4.50 uIU/mL  Lipid panel  Result Value Ref Range   Cholesterol 199 0 - 200 mg/dL   Triglycerides 66.0 0.0 - 149.0 mg/dL   HDL 56.90 >39.00 mg/dL   VLDL 13.2 0.0 - 40.0 mg/dL   LDL Cholesterol 129 (H) 0 - 99 mg/dL   Total CHOL/HDL Ratio 3    NonHDL 142.23   Comprehensive metabolic panel  Result Value Ref Range   Sodium 142 135 - 145 mEq/L   Potassium 3.9 3.5 - 5.1 mEq/L     Chloride 106 96 - 112 mEq/L   CO2 29 19 - 32 mEq/L   Glucose, Bld 95 70 - 99 mg/dL   BUN 19 6 - 23 mg/dL   Creatinine, Ser 0.71 0.40 - 1.20 mg/dL   Total Bilirubin 0.5 0.2 - 1.2 mg/dL   Alkaline Phosphatase 67 39 - 117 U/L   AST 19 0 - 37 U/L   ALT 13 0 - 35 U/L   Total Protein 6.9 6.0 - 8.3 g/dL   Albumin 4.2 3.5 - 5.2 g/dL   Calcium 9.1 8.4 - 10.5 mg/dL   GFR 88.74 >60.00 mL/min  CBC with Differential/Platelet  Result Value Ref Range   WBC 5.6 4.0 - 10.5 K/uL   RBC 4.73 3.87 - 5.11 Mil/uL   Hemoglobin 13.9 12.0 - 15.0 g/dL   HCT 41.4 36.0 - 46.0 %   MCV 87.5 78.0 - 100.0 fl   MCHC 33.4 30.0 - 36.0 g/dL   RDW 14.0 11.5 - 15.5 %   Platelets 302.0 150.0 - 400.0 K/uL   Neutrophils Relative % 58.1 43.0 - 77.0 %  Lymphocytes Relative 29.1 12.0 - 46.0 %   Monocytes Relative 7.9 3.0 - 12.0 %   Eosinophils Relative 4.1 0.0 - 5.0 %   Basophils Relative 0.8 0.0 - 3.0 %   Neutro Abs 3.3 1.4 - 7.7 K/uL   Lymphs Abs 1.6 0.7 - 4.0 K/uL   Monocytes Absolute 0.4 0.1 - 1.0 K/uL   Eosinophils Absolute 0.2 0.0 - 0.7 K/uL   Basophils Absolute 0.0 0.0 - 0.1 K/uL     Patient Active Problem List   Diagnosis Date Noted  . Estrogen deficiency 04/30/2018  . Pre-operative examination 02/21/2017  . Low back pain 06/11/2016  . Osteopenia 09/30/2013  . Encounter for routine gynecological examination 01/18/2012  . Routine general medical examination at a health care facility 01/09/2012  . [place], [place] 02/06/2010  . DEGENERATIVE JOINT DISEASE, MILD 07/19/2009   Past Medical History:  Diagnosis Date  . 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 Cancer (HCC)    BASAL CELL  . Chronic back pain   . GERD (gastroesophageal reflux disease)    OCC   Past Surgical History:  Procedure Laterality Date  . ANKLE SURGERY Right   . BASAL CELL CARCINOMA EXCISION  12/06/2017   forehead  . BREAST SURGERY  01/25/2018   Breast biopsy  . CESAREAN SECTION  [phone]   hodp pre term labor CS  .  OVARIAN CYST SURGERY  1973  . SHOULDER ARTHROSCOPY Right 10/31/2017   Procedure: ARTHROSCOPY SHOULDER WITH LYSIS OF ADHESION, SUBACROMINAL DECOMPRESSION,DISTAL CLAVICLE EXCISION.;  Surgeon: Thornton Park, MD;  Location: [facility] ORS;  Service: Orthopedics;  Laterality: Right;  SHOULDER ARTHROSCOPY WITH LYSIS AND RESECTION OF ADHESIONS  . SHOULDER ARTHROSCOPY WITH OPEN ROTATOR CUFF REPAIR AND DISTAL CLAVICLE ACROMINECTOMY Right 03/19/2017   Procedure: SHOULDER ARTHROSCOPY WITH OPEN ROTATOR CUFF REPAIR AND SUBACROMINAL DECOMPRESSION;  Surgeon: Thornton Park, MD;  Location: [facility] ORS;  Service: Orthopedics;  Laterality: Right;  . TUBAL LIGATION  1999  . WRIST GANGLION EXCISION     Social History   Tobacco Use  . Smoking status: Never Smoker  . Smokeless tobacco: Never Used  Substance Use Topics  . Alcohol use: Yes    Alcohol/week: 2.4 oz    Types: 4 Glasses of wine per week    Comment: 2-3 nights per week  . Drug use: No   Family History  Problem Relation Age of Onset  . Arthritis Mother        OA with verterabral fx  . Hypertension Mother   . Fibrocystic breast disease Mother   . Cancer Maternal Grandfather        colon CA  . Diabetes Paternal Grandmother    Allergies  Allergen Reactions  . Lorcet [Hydrocodone-Acetaminophen] Rash   Current Outpatient Medications on File Prior to Visit  Medication Sig Dispense Refill  . Cholecalciferol (D3-1000) 1000 units capsule Take 2,000 Units by mouth daily.     Bridget [name] Calcium (STOOL SOFTENER PO) Take 2 tablets by mouth daily.     Marland Kitchen ibuprofen (ADVIL,MOTRIN) 200 MG tablet Take 400 mg by mouth 2 (two) times daily as needed for mild pain.     Vladimir Faster Glycol-Propyl Glycol (SYSTANE OP) Apply 2 drops to eye as needed (dry eyes).    . meloxicam (MOBIC) 7.5 MG tablet Take 1 tablet by mouth daily as needed.  1   No current facility-administered medications on file prior to visit.     Review of Systems  Constitutional: Negative for activity  change,  appetite change, fatigue, fever and unexpected weight change.  HENT: Negative for congestion, ear pain, rhinorrhea, sinus pressure and sore throat.   Eyes: Negative for pain, redness and visual disturbance.  Respiratory: Negative for cough, shortness of breath and wheezing.   Cardiovascular: Negative for chest pain and palpitations.  Gastrointestinal: Negative for abdominal pain, blood in stool, constipation and diarrhea.  Endocrine: Negative for polydipsia and polyuria.  Genitourinary: Negative for dysuria, frequency and urgency.  Musculoskeletal: Negative for arthralgias, back pain and myalgias.  Skin: Negative for pallor and rash.  Allergic/Immunologic: Negative for environmental allergies.  Neurological: Negative for dizziness, syncope and headaches.  Hematological: Negative for adenopathy. Does not bruise/bleed easily.  Psychiatric/Behavioral: Negative for decreased concentration and dysphoric mood. The patient is not nervous/anxious.        Objective:   Physical Exam  Constitutional: She appears well-developed and well-nourished. No distress.  overwt and well app  HENT:  Head: Normocephalic and atraumatic.  Right Ear: External ear normal.  Left Ear: External ear normal.  Mouth/Throat: Oropharynx is clear and moist.  Eyes: Pupils are equal, round, and reactive to light. Conjunctivae and EOM are normal. No scleral icterus.  Neck: Normal range of motion. Neck supple. No JVD present. Carotid bruit is not present. No thyromegaly present.  Cardiovascular: Normal rate, regular rhythm, normal heart sounds and intact distal pulses. Exam reveals no gallop.  Pulmonary/Chest: Effort normal and breath sounds normal. No respiratory distress. She has no wheezes. She exhibits no tenderness. No breast tenderness, discharge or bleeding.  Abdominal: Soft. Bowel sounds are normal. She exhibits no distension, no abdominal bruit and no mass. There is no tenderness.  Genitourinary: No breast  tenderness, discharge or bleeding.  Genitourinary Comments: Breast exam: No mass, nodules, thickening, tenderness, bulging, retraction, inflamation, nipple discharge or skin changes noted.  No axillary or clavicular LA.      Musculoskeletal: Normal range of motion. She exhibits no edema or tenderness.  No kyphosis   Lymphadenopathy:    She has no cervical adenopathy.  Neurological: She is alert. She has normal reflexes. No cranial nerve deficit. She exhibits normal muscle tone. Coordination normal.  Skin: Skin is warm and dry. No rash noted. No erythema. No pallor.  Solar lentigines diffusely   Psychiatric: She has a normal mood and affect.  Pleasant           Assessment & Plan:   Problem List Items Addressed This Visit      Musculoskeletal and Integument   Osteopenia    Due for dexa  Disc need for calcium/ vitamin D/ wt bearing exercise and bone density test every 2 y to monitor Disc safety/ fracture risk in detail    No falls or fx         Other   Estrogen deficiency    Ref for dexa      Relevant Orders   DG Bone Density   Mild hyperlipidemia    Disc goals for lipids and reasons to control them Rev last labs with pt Rev low sat fat diet in detail       Routine general medical examination at a health care facility - Primary    Reviewed health habits including diet and exercise and skin cancer prevention Reviewed appropriate screening tests for age  Also reviewed health mt list, fam hx and immunization status , as well as social and family history    Labs rev  See HPI dexa ordered  Enc regular derm visits/sunscreen use  Low sat/trans  fat diet

## 2018-04-30 NOTE — Patient Instructions (Addendum)
If you are interested in the new shingles vaccine (Shingrix) - call your local pharmacy to check on coverage and availability  Call your pharmacy -if affordable get on a wait list   Take your vitamin D regularly  2000 iu daily   Avoid red meat/ fried foods/ egg yolks/ fatty breakfast meats/ butter, cheese and high fat dairy/ and shellfish    Take care of yourself  Get back to exercise gradually   Continue sun protection and also dermatology visits

## 2018-05-04 DIAGNOSIS — E785 Hyperlipidemia, unspecified: Secondary | ICD-10-CM | POA: Insufficient documentation

## 2018-05-04 NOTE — Assessment & Plan Note (Signed)
Reviewed health habits including diet and exercise and skin cancer prevention Reviewed appropriate screening tests for age  Also reviewed health mt list, fam hx and immunization status , as well as social and family history    Labs rev  See HPI dexa ordered  Enc regular derm visits/sunscreen use  Low sat/trans fat diet

## 2018-05-04 NOTE — Assessment & Plan Note (Signed)
Ref for dexa 

## 2018-05-04 NOTE — Assessment & Plan Note (Signed)
Disc goals for lipids and reasons to control them Rev last labs with pt Rev low sat fat diet in detail  

## 2018-05-04 NOTE — Assessment & Plan Note (Addendum)
Due for dexa  Disc need for calcium/ vitamin D/ wt bearing exercise and bone density test every 2 y to monitor Disc safety/ fracture risk in detail    No falls or fx

## 2018-05-30 ENCOUNTER — Other Ambulatory Visit: Payer: 59

## 2018-06-02 ENCOUNTER — Ambulatory Visit
Admission: RE | Admit: 2018-06-02 | Discharge: 2018-06-02 | Disposition: A | Discharge status: Home / Self Care | Payer: 59 | Source: Ambulatory Visit | Attending: Family Medicine | Admitting: Family Medicine

## 2018-06-02 DIAGNOSIS — Z780 Asymptomatic menopausal state: Secondary | ICD-10-CM | POA: Diagnosis not present

## 2018-06-02 DIAGNOSIS — E2839 Other primary ovarian failure: Secondary | ICD-10-CM

## 2018-06-02 DIAGNOSIS — M8589 Other specified disorders of bone density and structure, multiple sites: Secondary | ICD-10-CM | POA: Diagnosis not present

## 2018-06-18 ENCOUNTER — Encounter: Payer: Self-pay | Admitting: Family Medicine

## 2018-06-18 ENCOUNTER — Ambulatory Visit (INDEPENDENT_AMBULATORY_CARE_PROVIDER_SITE_OTHER): Payer: 59 | Admitting: Family Medicine

## 2018-06-18 VITALS — BP 102/60 | HR 86 | Temp 98.2°F | Ht 62.75 in | Wt 156.8 lb

## 2018-06-18 DIAGNOSIS — J22 Unspecified acute lower respiratory infection: Secondary | ICD-10-CM | POA: Diagnosis not present

## 2018-06-18 MED ORDER — AMOXICILLIN 875 MG PO TABS: 875.0000 mg | ORAL_TABLET | Freq: Two times a day (BID) | ORAL | 0 refills | Status: DC

## 2018-06-18 MED ORDER — BENZONATATE 100 MG PO CAPS: 100.0000 mg | ORAL_CAPSULE | Freq: Three times a day (TID) | ORAL | 0 refills | Status: DC | PRN

## 2018-06-18 NOTE — Patient Instructions (Signed)
Good to see you today  I have sent in an antibiotic and cough pill to your pharmacy  Get extra rest and fluids

## 2018-06-18 NOTE — Progress Notes (Signed)
Subjective:    Patient ID: Bridget [name], female    DOB: April 22, 1956, 62 y.o.   MRN: [id number]  HPI This is a 62 yo female who presents today with cough x 3 week. Started with sore throat x several days, runny nose x 3 days, laryngitis. Felt like she has not been improving for last week. Low grade fever in beginning. Cough is dry, no wheeze, no SOB. Throat achy. Has taken Cholacedin, acetaminophen with some relief. Cough gets triggered and has fits of cough.  No asthma.   Past Medical History:  Diagnosis Date  . 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 Cancer (HCC)    BASAL CELL  . Chronic back pain   . GERD (gastroesophageal reflux disease)    OCC   Past Surgical History:  Procedure Laterality Date  . ANKLE SURGERY Right   . BASAL CELL CARCINOMA EXCISION  12/06/2017   forehead  . BREAST SURGERY  01/25/2018   Breast biopsy  . CESAREAN SECTION  [phone]   hodp pre term labor CS  . OVARIAN CYST SURGERY  1973  . SHOULDER ARTHROSCOPY Right 10/31/2017   Procedure: ARTHROSCOPY SHOULDER WITH LYSIS OF ADHESION, SUBACROMINAL DECOMPRESSION,DISTAL CLAVICLE EXCISION.;  Surgeon: Thornton Park, MD;  Location: [facility] OR[facility];  Service: Orthopedics;  Laterality: Right;  SHOULDER ARTHROSCOPY WITH LYSIS AND RESECTION OF ADHESIONS  . SHOULDER ARTHROSCOPY WITH OPEN ROTATOR CUFF REPAIR AND DISTAL CLAVICLE ACROMINECTOMY Right 03/19/2017   Procedure: SHOULDER ARTHROSCOPY WITH OPEN ROTATOR CUFF REPAIR AND SUBACROMINAL DECOMPRESSION;  Surgeon: Thornton Park, MD;  Location: [facility];  Service: Orthopedics;  Laterality: Right;  . TUBAL LIGATION  1999  . WRIST GANGLION EXCISION     Family History  Problem Relation Age of Onset  . Arthritis Mother        OA with verterabral fx  . Hypertension Mother   . Fibrocystic breast disease Mother   . Cancer Maternal Grandfather        colon CA  . Diabetes Paternal Grandmother    Social History   Tobacco Use  . Smoking status: Never Smoker  .  Smokeless tobacco: Never Used  Substance Use Topics  . Alcohol use: Yes    Alcohol/week: 2.4 oz    Types: 4 Glasses of wine per week    Comment: 2-3 nights per week  . Drug use: No      Review of Systems Per HPI    Objective:   Physical Exam  Constitutional: She is oriented to person, place, and time. She appears well-developed and well-nourished.  HENT:  Head: Normocephalic and atraumatic.  Right Ear: External ear normal.  Left Ear: Tympanic membrane, external ear and ear canal normal.  Nose: Nose normal.  Mouth/Throat: Uvula is midline and mucous membranes are normal. Posterior oropharyngeal erythema (mild) present. No oropharyngeal exudate, posterior oropharyngeal edema or tonsillar abscesses.  Right tm occluded by cerumen. Non tender to exam.   Eyes: Conjunctivae and EOM are normal.  Neck: Normal range of motion. Neck supple.  Cardiovascular: Normal rate, regular rhythm and normal heart sounds.  Pulmonary/Chest: Effort normal and breath sounds normal.  Frequent dry cough witnessed.   Lymphadenopathy:    She has no cervical adenopathy.  Neurological: She is alert and oriented to person, place, and time.  Skin: Skin is warm and dry.  Psychiatric: She has a normal mood and affect. Her behavior is normal. Judgment and thought content normal.  Vitals reviewed.     BP 102/60 (BP Location:  Right Arm, Patient Position: Sitting, Cuff Size: Large)   Pulse 86   Temp 98.2 F (36.8 C) (Oral)   Ht 5' 2.75" (1.594 m)   Wt 156 lb 12 oz (71.1 kg)   SpO2 97%   BMI 27.99 kg/m  Wt Readings from Last 3 Encounters:  06/18/18 156 lb 12 oz (71.1 kg)  04/30/18 153 lb (69.4 kg)  10/31/17 154 lb (69.9 kg)       Assessment & Plan:  1. Lower respiratory infection - given duration and lack of improvement with supportive care, will treat for bacterial source - Provided written and verbal information regarding diagnosis and treatment. - RTC/ER precautions reviewed - benzonatate  (TESSALON) 100 MG capsule; Take 1-2 capsules (100-200 mg total) by mouth 3 (three) times daily as needed.  Dispense: 30 capsule; Refill: 0 - amoxicillin (AMOXIL) 875 MG tablet; Take 1 tablet (875 mg total) by mouth 2 (two) times daily.  Dispense: 14 tablet; Refill: 0   Clarene Reamer, FNP-BC  [facility]  06/18/2018 2:43 PM

## 2018-11-04 DIAGNOSIS — S63502A Unspecified sprain of left wrist, initial encounter: Secondary | ICD-10-CM | POA: Diagnosis not present

## 2018-11-17 DIAGNOSIS — D225 Melanocytic nevi of trunk: Secondary | ICD-10-CM | POA: Diagnosis not present

## 2018-11-17 DIAGNOSIS — D2271 Melanocytic nevi of right lower limb, including hip: Secondary | ICD-10-CM | POA: Diagnosis not present

## 2018-11-17 DIAGNOSIS — D2272 Melanocytic nevi of left lower limb, including hip: Secondary | ICD-10-CM | POA: Diagnosis not present

## 2018-11-17 DIAGNOSIS — D2261 Melanocytic nevi of right upper limb, including shoulder: Secondary | ICD-10-CM | POA: Diagnosis not present

## 2018-11-17 DIAGNOSIS — Z08 Encounter for follow-up examination after completed treatment for malignant neoplasm: Secondary | ICD-10-CM | POA: Diagnosis not present

## 2018-11-17 DIAGNOSIS — L820 Inflamed seborrheic keratosis: Secondary | ICD-10-CM | POA: Diagnosis not present

## 2018-11-17 DIAGNOSIS — L821 Other seborrheic keratosis: Secondary | ICD-10-CM | POA: Diagnosis not present

## 2018-11-17 DIAGNOSIS — Z85828 Personal history of other malignant neoplasm of skin: Secondary | ICD-10-CM | POA: Diagnosis not present

## 2018-11-17 DIAGNOSIS — D2262 Melanocytic nevi of left upper limb, including shoulder: Secondary | ICD-10-CM | POA: Diagnosis not present

## 2018-11-26 DIAGNOSIS — M25532 Pain in left wrist: Secondary | ICD-10-CM | POA: Diagnosis not present

## 2018-12-18 DIAGNOSIS — L538 Other specified erythematous conditions: Secondary | ICD-10-CM | POA: Diagnosis not present

## 2018-12-18 DIAGNOSIS — M25532 Pain in left wrist: Secondary | ICD-10-CM | POA: Diagnosis not present

## 2018-12-18 DIAGNOSIS — L820 Inflamed seborrheic keratosis: Secondary | ICD-10-CM | POA: Diagnosis not present

## 2019-01-07 DIAGNOSIS — M25532 Pain in left wrist: Secondary | ICD-10-CM | POA: Diagnosis not present

## 2019-01-07 DIAGNOSIS — S63592A Other specified sprain of left wrist, initial encounter: Secondary | ICD-10-CM | POA: Diagnosis not present

## 2019-02-09 ENCOUNTER — Encounter: Payer: Self-pay | Admitting: Family Medicine

## 2019-02-09 ENCOUNTER — Ambulatory Visit (INDEPENDENT_AMBULATORY_CARE_PROVIDER_SITE_OTHER): Payer: 59 | Admitting: Family Medicine

## 2019-02-09 VITALS — BP 102/60 | HR 78 | Temp 98.3°F | Ht 62.75 in | Wt 152.6 lb

## 2019-02-09 DIAGNOSIS — J101 Influenza due to other identified influenza virus with other respiratory manifestations: Secondary | ICD-10-CM | POA: Diagnosis not present

## 2019-02-09 DIAGNOSIS — R05 Cough: Secondary | ICD-10-CM | POA: Diagnosis not present

## 2019-02-09 DIAGNOSIS — R059 Cough, unspecified: Secondary | ICD-10-CM

## 2019-02-09 LAB — POC INFLUENZA A&B (BINAX/QUICKVUE)
Influenza A, POC: POSITIVE — AB
Influenza B, POC: NEGATIVE

## 2019-02-09 NOTE — Assessment & Plan Note (Signed)
Today is day 5 of symptoms -out of window for antiviral tx  Starting to improve Cannot return to work due to [employer] sympt care -fever control with analgesic and guaif-dm for cough  Update if not starting to improve in a week or if worsening

## 2019-02-09 NOTE — Patient Instructions (Addendum)
Drink lots of fluids and rest  Cough medicine as needed   Most flu cases last 7 days (but cough lasts longer)   Tylenol or ibuprofen for fever and pain   Update if not starting to improve in a week or if worsening

## 2019-02-09 NOTE — Progress Notes (Signed)
Subjective:    Patient ID: Bridget [name], female    DOB: 1956/05/24, 63 y.o.   MRN: [id number]  HPI Here with fever/cough and sore throat   Started with a nagging cough Thursday on way to the beach  Temp 101.5 while she was there   Was feeling a little better during the day Sunday and then fever went back up  Cough is improving- mostly dry  Temp goes up and down  Did not take anything for fever today  Was 100.3 this am   Ears are ok  Throat is sore  Ribs are sore  Body aches/chills   No nasal symptoms   No sob   Pulse ox 98% today  Afebrile for the exam   Had a flu shot this year   Pos flu A test today Results for orders placed or performed in visit on 02/09/19  POC Influenza A&B(BINAX/QUICKVUE)  Result Value Ref Range   Influenza A, POC Positive (A) Negative   Influenza B, POC Negative Negative     otc took robitussin cough med  Then chlorcedin cough   Patient Active Problem List   Diagnosis Date Noted  . Mild hyperlipidemia 05/04/2018  . Estrogen deficiency 04/30/2018  . Pre-operative examination 02/21/2017  . Low back pain 06/11/2016  . Osteopenia 09/30/2013  . Encounter for routine gynecological examination 01/18/2012  . Routine general medical examination at a health care facility 01/09/2012  . [place], [place] 02/06/2010  . DEGENERATIVE JOINT DISEASE, MILD 07/19/2009   Past Medical History:  Diagnosis Date  . 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 Cancer (HCC)    BASAL CELL  . Chronic back pain   . GERD (gastroesophageal reflux disease)    OCC   Past Surgical History:  Procedure Laterality Date  . ANKLE SURGERY Right   . BASAL CELL CARCINOMA EXCISION  12/06/2017   forehead  . BREAST SURGERY  01/25/2018   Breast biopsy  . CESAREAN SECTION  [phone]   hodp pre term labor CS  . OVARIAN CYST SURGERY  1973  . SHOULDER ARTHROSCOPY Right 10/31/2017   Procedure: ARTHROSCOPY SHOULDER WITH LYSIS OF ADHESION, SUBACROMINAL  DECOMPRESSION,DISTAL CLAVICLE EXCISION.;  Surgeon: Thornton Park, MD;  Location: [facility];  Service: Orthopedics;  Laterality: Right;  SHOULDER ARTHROSCOPY WITH LYSIS AND RESECTION OF ADHESIONS  . SHOULDER ARTHROSCOPY WITH OPEN ROTATOR CUFF REPAIR AND DISTAL CLAVICLE ACROMINECTOMY Right 03/19/2017   Procedure: SHOULDER ARTHROSCOPY WITH OPEN ROTATOR CUFF REPAIR AND SUBACROMINAL DECOMPRESSION;  Surgeon: Thornton Park, MD;  Location: [facility];  Service: Orthopedics;  Laterality: Right;  . TUBAL LIGATION  1999  . WRIST GANGLION EXCISION     Social History   Tobacco Use  . Smoking status: Never Smoker  . Smokeless tobacco: Never Used  Substance Use Topics  . Alcohol use: Yes    Alcohol/week: 4.0 standard drinks    Types: 4 Glasses of wine per week    Comment: 2-3 nights per week  . Drug use: No   Family History  Problem Relation Age of Onset  . Arthritis Mother        OA with verterabral fx  . Hypertension Mother   . Fibrocystic breast disease Mother   . Cancer Maternal Grandfather        colon CA  . Diabetes Paternal Grandmother    Allergies  Allergen Reactions  . Lorcet [Hydrocodone-Acetaminophen] Rash   Current Outpatient Medications on File Prior to Visit  Medication Sig Dispense Refill  .  Cholecalciferol (D3-1000) 1000 units capsule Take 2,000 Units by mouth daily.     Mariane Baumgarten Calcium (STOOL SOFTENER PO) Take 2 tablets by mouth daily.     Marland Kitchen ibuprofen (ADVIL,MOTRIN) 200 MG tablet Take 400 mg by mouth 2 (two) times daily as needed for mild pain.     . meloxicam (MOBIC) 7.5 MG tablet Take 1 tablet by mouth daily as needed.  1  . Polyethyl Glycol-Propyl Glycol (SYSTANE OP) Apply 2 drops to eye as needed (dry eyes).     No current facility-administered medications on file prior to visit.     Review of Systems  Constitutional: Positive for appetite change, chills, fatigue and fever. Negative for activity change and unexpected weight change.  HENT: Positive for postnasal  drip and voice change. Negative for ear discharge, ear pain, facial swelling, rhinorrhea, sinus pressure, sinus pain and sore throat.   Eyes: Negative for pain, redness and visual disturbance.  Respiratory: Positive for cough. Negative for chest tightness, shortness of breath and wheezing.   Cardiovascular: Negative for chest pain and palpitations.  Gastrointestinal: Negative for abdominal pain, blood in stool, constipation and diarrhea.  Endocrine: Negative for polydipsia and polyuria.  Genitourinary: Negative for dysuria, frequency and urgency.  Musculoskeletal: Negative for arthralgias, back pain and myalgias.  Skin: Negative for pallor and rash.  Allergic/Immunologic: Negative for environmental allergies.  Neurological: Negative for dizziness, syncope and headaches.  Hematological: Negative for adenopathy. Does not bruise/bleed easily.  Psychiatric/Behavioral: Negative for decreased concentration and dysphoric mood. The patient is not nervous/anxious.        Objective:   Physical Exam Constitutional:      General: She is not in acute distress.    Appearance: She is well-developed and normal weight. She is ill-appearing.  HENT:     Head: Normocephalic and atraumatic.     Comments: No sinus tenderness    Right Ear: Tympanic membrane and ear canal normal.     Left Ear: Tympanic membrane normal.     Mouth/Throat:     Mouth: Mucous membranes are moist.     Pharynx: Oropharynx is clear.  Eyes:     General:        Right eye: No discharge.        Left eye: No discharge.     Conjunctiva/sclera: Conjunctivae normal.     Pupils: Pupils are equal, round, and reactive to light.  Neck:     Musculoskeletal: Normal range of motion.  Cardiovascular:     Rate and Rhythm: Normal rate and regular rhythm.  Pulmonary:     Effort: Pulmonary effort is normal. No respiratory distress.     Breath sounds: Normal breath sounds. No stridor. No wheezing, rhonchi or rales.     Comments: Good air  exch Dry cough  No wheeze or rales or rhonchi Lymphadenopathy:     Cervical: No cervical adenopathy.  Skin:    General: Skin is warm and dry.     Findings: No rash.  Neurological:     Mental Status: She is alert.     Cranial Nerves: No cranial nerve deficit.  Psychiatric:        Mood and Affect: Mood normal.           Assessment & Plan:   Problem List Items Addressed This Visit      Respiratory   Influenza A - Primary    Today is day 5 of symptoms -out of window for antiviral tx  Starting to improve Cannot return  to work due to fever/cough  Disc sympt care -fever control with analgesic and guaif-dm for cough  Update if not starting to improve in a week or if worsening          Other Visit Diagnoses    Cough       Relevant Orders   POC Influenza A&B(BINAX/QUICKVUE) (Completed)

## 2019-03-05 ENCOUNTER — Encounter: Payer: Self-pay | Admitting: Family Medicine

## 2019-03-10 ENCOUNTER — Encounter: Payer: Self-pay | Admitting: Family Medicine

## 2019-03-10 ENCOUNTER — Ambulatory Visit (INDEPENDENT_AMBULATORY_CARE_PROVIDER_SITE_OTHER): Payer: 59 | Admitting: Family Medicine

## 2019-03-10 ENCOUNTER — Other Ambulatory Visit: Payer: Self-pay

## 2019-03-10 VITALS — BP 114/66 | HR 80 | Temp 98.3°F | Ht 62.75 in | Wt 155.5 lb

## 2019-03-10 DIAGNOSIS — R05 Cough: Secondary | ICD-10-CM | POA: Diagnosis not present

## 2019-03-10 DIAGNOSIS — J029 Acute pharyngitis, unspecified: Secondary | ICD-10-CM

## 2019-03-10 DIAGNOSIS — R058 Other specified cough: Secondary | ICD-10-CM | POA: Insufficient documentation

## 2019-03-10 LAB — POCT RAPID STREP A (OFFICE): Rapid Strep A Screen: NEGATIVE

## 2019-03-10 NOTE — Patient Instructions (Addendum)
Delsym (generic) is a good idea over the counter You don't need an expectorant unless you get congested  We want to stop the cyclic cough   Drink a lot of fluids Continue tessalon   Antihistamine may help (zyrtec) may also help   Salt water gargles for sore throat Watch for fever Bridget [name] cough /wheezing   Negative strep test

## 2019-03-10 NOTE — Progress Notes (Signed)
Subjective:    Patient ID: Bridget [name], female    DOB: 1956-12-24, 63 y.o.   MRN: [id number]  HPI Here for ST with cough and neck discomfort   Coughs frequently (especially if she takes deep breath or laughs or talks a lot) Neck is sore/glands /under jaw  Sore in between shoulder blades  Dry cough -no production  Any time of day   Throat feels thick/swollen  Is sore -not severe   No fever   No strep contacts that she knows of   Staying a little hoarse  Eyes are irritated   Ears are fine No nasal congestion  occ pnd/ not bad  No sinus pain or pressure    She had flu A 02/09/19= did improve but ST and cough did not completely go away   Tessalon for cough   Results for orders placed or performed in visit on 03/10/19  POCT rapid strep A  Result Value Ref Range   Rapid Strep A Screen Negative Negative     Patient Active Problem List   Diagnosis Date Noted  . Post-viral cough syndrome 03/10/2019  . Sore throat 03/10/2019  . Influenza A 02/09/2019  . Mild hyperlipidemia 05/04/2018  . Estrogen deficiency 04/30/2018  . Pre-operative examination 02/21/2017  . Low back pain 06/11/2016  . Osteopenia 09/30/2013  . Encounter for routine gynecological examination 01/18/2012  . Routine general medical examination at a health care facility 01/09/2012  . [place], [place] 02/06/2010  . DEGENERATIVE JOINT DISEASE, MILD 07/19/2009   Past Medical History:  Diagnosis Date  . 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 Cancer (HCC)    BASAL CELL  . Chronic back pain   . GERD (gastroesophageal reflux disease)    OCC   Past Surgical History:  Procedure Laterality Date  . ANKLE SURGERY Right   . BASAL CELL CARCINOMA EXCISION  12/06/2017   forehead  . BREAST SURGERY  01/25/2018   Breast biopsy  . CESAREAN SECTION  [phone]   hodp pre term labor CS  . OVARIAN CYST SURGERY  1973  . SHOULDER ARTHROSCOPY Right 10/31/2017   Procedure: ARTHROSCOPY SHOULDER WITH LYSIS  OF ADHESION, SUBACROMINAL DECOMPRESSION,DISTAL CLAVICLE EXCISION.;  Surgeon: Thornton Park, MD;  Location: [facility];  Service: Orthopedics;  Laterality: Right;  SHOULDER ARTHROSCOPY WITH LYSIS AND RESECTION OF ADHESIONS  . SHOULDER ARTHROSCOPY WITH OPEN ROTATOR CUFF REPAIR AND DISTAL CLAVICLE ACROMINECTOMY Right 03/19/2017   Procedure: SHOULDER ARTHROSCOPY WITH OPEN ROTATOR CUFF REPAIR AND SUBACROMINAL DECOMPRESSION;  Surgeon: Thornton Park, MD;  Location: [facility];  Service: Orthopedics;  Laterality: Right;  . TUBAL LIGATION  1999  . WRIST GANGLION EXCISION     Social History   Tobacco Use  . Smoking status: Never Smoker  . Smokeless tobacco: Never Used  Substance Use Topics  . Alcohol use: Yes    Alcohol/week: 4.0 standard drinks    Types: 4 Glasses of wine per week    Comment: 2-3 nights per week  . Drug use: No   Family History  Problem Relation Age of Onset  . Arthritis Mother        OA with verterabral fx  . Hypertension Mother   . Fibrocystic breast disease Mother   . Cancer Maternal Grandfather        colon CA  . Diabetes Paternal Grandmother    Allergies  Allergen Reactions  . Lorcet [Hydrocodone-Acetaminophen] Rash   Current Outpatient Medications on File Prior to Visit  Medication Sig  Dispense Refill  . Cholecalciferol (D3-1000) 1000 units capsule Take 2,000 Units by mouth daily.     Mariane Baumgarten Calcium (STOOL SOFTENER PO) Take 2 tablets by mouth daily.     Marland Kitchen ibuprofen (ADVIL,MOTRIN) 200 MG tablet Take 400 mg by mouth 2 (two) times daily as needed for mild pain.     . meloxicam (MOBIC) 7.5 MG tablet Take 1 tablet by mouth daily as needed.  1  . Polyethyl Glycol-Propyl Glycol (SYSTANE OP) Apply 2 drops to eye as needed (dry eyes).     No current facility-administered medications on file prior to visit.     Review of Systems  Constitutional: Positive for appetite change and fatigue. Negative for fever.  HENT: Positive for congestion, postnasal drip and sore  throat. Negative for ear pain, rhinorrhea, sinus pressure, sneezing and voice change.   Eyes: Negative for pain and discharge.  Respiratory: Positive for cough. Negative for chest tightness, shortness of breath, wheezing and stridor.   Cardiovascular: Negative for chest pain.  Gastrointestinal: Negative for diarrhea, nausea and vomiting.  Genitourinary: Negative for frequency, hematuria and urgency.  Musculoskeletal: Negative for arthralgias and myalgias.  Skin: Negative for rash.  Neurological: Positive for headaches. Negative for dizziness, weakness and light-headedness.  Psychiatric/Behavioral: Negative for confusion and dysphoric mood.       Objective:   Physical Exam Constitutional:      General: She is not in acute distress.    Appearance: Normal appearance. She is well-developed and normal weight. She is not ill-appearing, toxic-appearing or diaphoretic.  HENT:     Head: Normocephalic and atraumatic.     Comments: Nares are injected and congested      Right Ear: Tympanic membrane, ear canal and external ear normal.     Left Ear: Tympanic membrane, ear canal and external ear normal.     Nose: Congestion and rhinorrhea present.     Mouth/Throat:     Mouth: Mucous membranes are moist.     Pharynx: Oropharynx is clear. No oropharyngeal exudate or posterior oropharyngeal erythema.     Comments: Clear pnd  Eyes:     General:        Right eye: No discharge.        Left eye: No discharge.     Conjunctiva/sclera: Conjunctivae normal.     Pupils: Pupils are equal, round, and reactive to light.  Neck:     Musculoskeletal: Normal range of motion and neck supple.     Comments: Pt has some mild anterior tenderness of soft tissue under mandible  No LN palpated  Nl rom neck  Cardiovascular:     Rate and Rhythm: Normal rate.     Heart sounds: Normal heart sounds.  Pulmonary:     Effort: Pulmonary effort is normal. No respiratory distress.     Breath sounds: Normal breath sounds. No  stridor. No wheezing, rhonchi or rales.     Comments: Good air exch No rales or rhonchi  No wheeze or stridor   Good air exch Lymphadenopathy:     Cervical: No cervical adenopathy.  Skin:    General: Skin is warm and dry.     Capillary Refill: Capillary refill takes less than 2 seconds.     Findings: No rash.  Neurological:     Mental Status: She is alert.     Cranial Nerves: No cranial nerve deficit.  Psychiatric:        Mood and Affect: Mood normal.  Assessment & Plan:   Problem List Items Addressed This Visit      Respiratory   Post-viral cough syndrome - Primary    After case of flu last month  Disc use of tessalon tid  Will add delsym otc as directed also  Lozenges prn  Steam/fluids Update if not improved Suspect ST is from her cough        Other   Sore throat    In pt with post viral cough syndrome Suspect caused by the cough  RST negative Disc sympt care-analgesic/ gargles  Cough control with tessalon and delsym  Update if not starting to improve in a week or if worsening        Relevant Orders   POCT rapid strep A (Completed)

## 2019-03-10 NOTE — Assessment & Plan Note (Signed)
In pt with post viral cough syndrome Suspect caused by the cough  RST negative Disc sympt care-analgesic/ gargles  Cough control with tessalon and delsym  Update if not starting to improve in a week or if worsening

## 2019-03-10 NOTE — Assessment & Plan Note (Signed)
After case of flu last month  Disc use of tessalon tid  Will add delsym otc as directed also  Lozenges prn  Steam/fluids Update if not improved Suspect ST is from her cough

## 2019-03-12 ENCOUNTER — Other Ambulatory Visit: Payer: Self-pay | Admitting: Orthopedic Surgery

## 2019-03-12 DIAGNOSIS — M25532 Pain in left wrist: Secondary | ICD-10-CM

## 2019-03-16 ENCOUNTER — Telehealth: Payer: Self-pay | Admitting: Family Medicine

## 2019-03-16 MED ORDER — BENZONATATE 200 MG PO CAPS: 200.0000 mg | ORAL_CAPSULE | Freq: Three times a day (TID) | ORAL | 1 refills | Status: DC | PRN

## 2019-03-16 NOTE — Telephone Encounter (Signed)
Pt called to get refill for Tessalon- she has run out. She thought she had more - as discussed at Please send to [facility] outpatient pharmacy

## 2019-03-16 NOTE — Telephone Encounter (Signed)
Px sent

## 2019-04-01 ENCOUNTER — Other Ambulatory Visit: Payer: Self-pay | Admitting: Family Medicine

## 2019-04-01 DIAGNOSIS — Z1231 Encounter for screening mammogram for malignant neoplasm of breast: Secondary | ICD-10-CM

## 2019-04-15 ENCOUNTER — Ambulatory Visit: Payer: 59

## 2019-04-16 ENCOUNTER — Ambulatory Visit: Payer: 59

## 2019-04-17 ENCOUNTER — Other Ambulatory Visit: Payer: Self-pay

## 2019-04-17 ENCOUNTER — Ambulatory Visit
Admission: RE | Admit: 2019-04-17 | Discharge: 2019-04-17 | Disposition: A | Discharge status: Home / Self Care | Payer: 59 | Source: Ambulatory Visit | Attending: Orthopedic Surgery | Admitting: Orthopedic Surgery

## 2019-04-17 DIAGNOSIS — M25432 Effusion, left wrist: Secondary | ICD-10-CM | POA: Diagnosis not present

## 2019-04-17 DIAGNOSIS — M25532 Pain in left wrist: Secondary | ICD-10-CM | POA: Insufficient documentation

## 2019-04-17 DIAGNOSIS — M24132 Other articular cartilage disorders, left wrist: Secondary | ICD-10-CM | POA: Diagnosis not present

## 2019-05-05 ENCOUNTER — Other Ambulatory Visit: Payer: 59

## 2019-05-08 ENCOUNTER — Encounter: Payer: 59 | Admitting: Family Medicine

## 2019-06-02 ENCOUNTER — Other Ambulatory Visit: Payer: Self-pay

## 2019-06-02 ENCOUNTER — Ambulatory Visit
Admission: RE | Admit: 2019-06-02 | Discharge: 2019-06-02 | Disposition: A | Discharge status: Home / Self Care | Payer: 59 | Source: Ambulatory Visit | Attending: Family Medicine | Admitting: Family Medicine

## 2019-06-02 DIAGNOSIS — Z1231 Encounter for screening mammogram for malignant neoplasm of breast: Secondary | ICD-10-CM | POA: Diagnosis not present

## 2019-07-22 DIAGNOSIS — Z85828 Personal history of other malignant neoplasm of skin: Secondary | ICD-10-CM | POA: Diagnosis not present

## 2019-07-22 DIAGNOSIS — D2262 Melanocytic nevi of left upper limb, including shoulder: Secondary | ICD-10-CM | POA: Diagnosis not present

## 2019-07-22 DIAGNOSIS — D2271 Melanocytic nevi of right lower limb, including hip: Secondary | ICD-10-CM | POA: Diagnosis not present

## 2019-07-22 DIAGNOSIS — Z08 Encounter for follow-up examination after completed treatment for malignant neoplasm: Secondary | ICD-10-CM | POA: Diagnosis not present

## 2019-07-22 DIAGNOSIS — L821 Other seborrheic keratosis: Secondary | ICD-10-CM | POA: Diagnosis not present

## 2019-07-22 DIAGNOSIS — D2272 Melanocytic nevi of left lower limb, including hip: Secondary | ICD-10-CM | POA: Diagnosis not present

## 2019-07-22 DIAGNOSIS — D225 Melanocytic nevi of trunk: Secondary | ICD-10-CM | POA: Diagnosis not present

## 2019-07-22 DIAGNOSIS — D2261 Melanocytic nevi of right upper limb, including shoulder: Secondary | ICD-10-CM | POA: Diagnosis not present

## 2019-07-26 ENCOUNTER — Telehealth: Payer: Self-pay | Admitting: Family Medicine

## 2019-07-26 DIAGNOSIS — E785 Hyperlipidemia, unspecified: Secondary | ICD-10-CM

## 2019-07-26 DIAGNOSIS — Z0000 Encounter for general adult medical examination without abnormal findings: Secondary | ICD-10-CM

## 2019-07-26 NOTE — Telephone Encounter (Signed)
-----   Message from Cloyd Stagers, RT sent at 07/24/2019  1:03 PM EDT ----- Regarding: Lab Orders for Monday 8.3.2020 Please place lab orders for Monday 8.3.2020, office visit for physical on Wednesday 8.12.2020 Thank you, Dyke Maes RT(R)

## 2019-07-28 ENCOUNTER — Other Ambulatory Visit: Payer: Self-pay

## 2019-07-28 ENCOUNTER — Telehealth: Payer: Self-pay | Admitting: Family Medicine

## 2019-07-28 ENCOUNTER — Other Ambulatory Visit (INDEPENDENT_AMBULATORY_CARE_PROVIDER_SITE_OTHER): Payer: 59

## 2019-07-28 DIAGNOSIS — Z0000 Encounter for general adult medical examination without abnormal findings: Secondary | ICD-10-CM

## 2019-07-28 DIAGNOSIS — E785 Hyperlipidemia, unspecified: Secondary | ICD-10-CM | POA: Diagnosis not present

## 2019-07-28 LAB — LIPID PANEL
Cholesterol: 198 mg/dL (ref 0–200)
HDL: 53.8 mg/dL (ref >39.00)
LDL Cholesterol: 126 mg/dL — ABNORMAL HIGH (ref 0–99)
NonHDL: 144.03
Total CHOL/HDL Ratio: 4
Triglycerides: 91 mg/dL (ref 0.0–149.0)
VLDL: 18.2 mg/dL (ref 0.0–40.0)

## 2019-07-28 LAB — CBC WITH DIFFERENTIAL/PLATELET
Basophils Absolute: 0 10*3/uL (ref 0.0–0.1)
Basophils Relative: 0.5 % (ref 0.0–3.0)
Eosinophils Absolute: 0.3 10*3/uL (ref 0.0–0.7)
Eosinophils Relative: 4.1 % (ref 0.0–5.0)
HCT: 38.9 % (ref 36.0–46.0)
Hemoglobin: 12.7 g/dL (ref 12.0–15.0)
Lymphocytes Relative: 27.8 % (ref 12.0–46.0)
Lymphs Abs: 1.8 10*3/uL (ref 0.7–4.0)
MCHC: 32.6 g/dL (ref 30.0–36.0)
MCV: 88.2 fl (ref 78.0–100.0)
Monocytes Absolute: 0.5 10*3/uL (ref 0.1–1.0)
Monocytes Relative: 8.1 % (ref 3.0–12.0)
Neutro Abs: 3.8 10*3/uL (ref 1.4–7.7)
Neutrophils Relative %: 59.5 % (ref 43.0–77.0)
Platelets: 316 10*3/uL (ref 150.0–400.0)
RBC: 4.41 Mil/uL (ref 3.87–5.11)
RDW: 14.5 % (ref 11.5–15.5)
WBC: 6.3 10*3/uL (ref 4.0–10.5)

## 2019-07-28 LAB — COMPREHENSIVE METABOLIC PANEL
ALT: 13 U/L (ref 0–35)
AST: 16 U/L (ref 0–37)
Albumin: 4.1 g/dL (ref 3.5–5.2)
Alkaline Phosphatase: 67 U/L (ref 39–117)
BUN: 20 mg/dL (ref 6–23)
CO2: 30 mEq/L (ref 19–32)
Calcium: 9.2 mg/dL (ref 8.4–10.5)
Chloride: 104 mEq/L (ref 96–112)
Creatinine, Ser: 0.69 mg/dL (ref 0.40–1.20)
GFR: 85.94 mL/min (ref >60.00)
Glucose, Bld: 99 mg/dL (ref 70–99)
Potassium: 4 mEq/L (ref 3.5–5.1)
Sodium: 139 mEq/L (ref 135–145)
Total Bilirubin: 0.4 mg/dL (ref 0.2–1.2)
Total Protein: 6.5 g/dL (ref 6.0–8.3)

## 2019-07-28 LAB — TSH: TSH: 1.91 u[IU]/mL (ref 0.35–4.50)

## 2019-07-28 NOTE — Telephone Encounter (Signed)
-----   Message from Ellamae Sia sent at 07/23/2019 10:40 AM EDT ----- Regarding: Lab orders for Wednesday, 8.5.20 Patient is scheduled for CPX labs, please order future labs, Thanks , Karna Christmas

## 2019-07-29 ENCOUNTER — Other Ambulatory Visit: Payer: 59

## 2019-08-05 ENCOUNTER — Other Ambulatory Visit (HOSPITAL_COMMUNITY)
Admission: RE | Admit: 2019-08-05 | Discharge: 2019-08-05 | Disposition: A | Discharge status: Home / Self Care | Payer: 59 | Source: Ambulatory Visit | Attending: Family Medicine | Admitting: Family Medicine

## 2019-08-05 ENCOUNTER — Other Ambulatory Visit: Payer: Self-pay

## 2019-08-05 ENCOUNTER — Encounter: Payer: Self-pay | Admitting: Family Medicine

## 2019-08-05 ENCOUNTER — Ambulatory Visit (INDEPENDENT_AMBULATORY_CARE_PROVIDER_SITE_OTHER): Payer: 59 | Admitting: Family Medicine

## 2019-08-05 ENCOUNTER — Ambulatory Visit (INDEPENDENT_AMBULATORY_CARE_PROVIDER_SITE_OTHER)
Admission: RE | Admit: 2019-08-05 | Discharge: 2019-08-05 | Disposition: A | Discharge status: Home / Self Care | Payer: 59 | Source: Ambulatory Visit | Attending: Family Medicine | Admitting: Family Medicine

## 2019-08-05 VITALS — BP 120/64 | HR 71 | Temp 98.1°F | Ht 63.0 in | Wt 153.3 lb

## 2019-08-05 DIAGNOSIS — R05 Cough: Secondary | ICD-10-CM | POA: Diagnosis not present

## 2019-08-05 DIAGNOSIS — R058 Other specified cough: Secondary | ICD-10-CM

## 2019-08-05 DIAGNOSIS — Z0000 Encounter for general adult medical examination without abnormal findings: Secondary | ICD-10-CM

## 2019-08-05 DIAGNOSIS — E785 Hyperlipidemia, unspecified: Secondary | ICD-10-CM | POA: Diagnosis not present

## 2019-08-05 DIAGNOSIS — M8589 Other specified disorders of bone density and structure, multiple sites: Secondary | ICD-10-CM | POA: Diagnosis not present

## 2019-08-05 DIAGNOSIS — Z01419 Encounter for gynecological examination (general) (routine) without abnormal findings: Secondary | ICD-10-CM

## 2019-08-05 NOTE — Progress Notes (Signed)
Subjective:    Patient ID: Bridget [name], female    DOB: Dec 17, 1956, 63 y.o.   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 (after shoulder and wrist issues) Diet - fairly healthy     27.16 kg/m    Gyn care Pap 5/17 -neg with neg HPV screen  Will do today   Flu vaccine -gets in the fall  Tdap 5/17  Zoster status -interested in shingrix   Mammogram 6/20  Self breast exam -no lumps   Colonoscopy 10/10 -due in oct   dexa 6/19 -osteopenia worst in femoral neck Fell in nov-injured wrist/no fracture (tripped over a speed bump-did not see it) No fractures Supplements=taking D and ca Exercise- walking   PHQ: depression screen 0  BP Readings from Last 3 Encounters:  08/05/19 120/64  03/10/19 114/66  02/09/19 102/60   Pulse Readings from Last 3 Encounters:  08/05/19 71  03/10/19 80  02/09/19 78    Hyperlipidemia Lab Results  Component Value Date   CHOL 198 07/28/2019   CHOL 199 04/24/2018   CHOL 190 04/23/2016   Lab Results  Component Value Date   HDL 53.80 07/28/2019   HDL 56.90 04/24/2018   HDL 53.30 04/23/2016   Lab Results  Component Value Date   LDLCALC 126 (H) 07/28/2019   LDLCALC 129 (H) 04/24/2018   LDLCALC 121 (H) 04/23/2016   Lab Results  Component Value Date   TRIG 91.0 07/28/2019   TRIG 66.0 04/24/2018   TRIG 74.0 04/23/2016   Lab Results  Component Value Date   CHOLHDL 4  07/28/2019   CHOLHDL 3 04/24/2018   CHOLHDL 4 04/23/2016   No results found for: LDLDIRECT Eats eggs for protein  Very stable   Other labs Results for orders placed or performed in visit on 07/28/19  TSH  Result Value Ref Range   TSH 1.91 0.35 - 4.50 uIU/mL  Lipid panel  Result Value Ref Range   Cholesterol 198 0 - 200 mg/dL   Triglycerides 91.0 0.0 - 149.0 mg/dL   HDL 53.80 >39.00 mg/dL   VLDL 18.2 0.0 - 40.0 mg/dL   LDL Cholesterol 126 (H) 0 - 99 mg/dL   Total CHOL/HDL Ratio 4    NonHDL 144.03   Comprehensive metabolic panel  Result Value Ref Range   Sodium 139 135 - 145 mEq/L   Potassium 4.0 3.5 - 5.1 mEq/L   Chloride 104 96 - 112 mEq/L   CO2 30 19 - 32 mEq/L   Glucose, Bld 99 70 - 99 mg/dL   BUN 20 6 - 23 mg/dL   Creatinine, Ser 0.69 0.40 - 1.20 mg/dL   Total Bilirubin 0.4 0.2 - 1.2 mg/dL   Alkaline Phosphatase 67 39 - 117 U/L   AST 16 0 - 37 U/L  ALT 13 0 - 35 U/L   Total Protein 6.5 6.0 - 8.3 g/dL   Albumin 4.1 3.5 - 5.2 g/dL   Calcium 9.2 8.4 - 10.5 mg/dL   GFR 85.94 >60.00 mL/min  CBC with Differential/Platelet  Result Value Ref Range   WBC 6.3 4.0 - 10.5 K/uL   RBC 4.41 3.87 - 5.11 Mil/uL   Hemoglobin 12.7 12.0 - 15.0 g/dL   HCT 38.9 36.0 - 46.0 %   MCV 88.2 78.0 - 100.0 fl   MCHC 32.6 30.0 - 36.0 g/dL   RDW 14.5 11.5 - 15.5 %   Platelets 316.0 150.0 - 400.0 K/uL   Neutrophils Relative % 59.5 43.0 - 77.0 %   Lymphocytes Relative 27.8 12.0 - 46.0 %   Monocytes Relative 8.1 3.0 - 12.0 %   Eosinophils Relative 4.1 0.0 - 5.0 %   Basophils Relative 0.5 0.0 - 3.0 %   Neutro Abs 3.8 1.4 - 7.7 K/uL   Lymphs Abs 1.8 0.7 - 4.0 K/uL   Monocytes Absolute 0.5 0.1 - 1.0 K/uL   Eosinophils Absolute 0.3 0.0 - 0.7 K/uL   Basophils Absolute 0.0 0.0 - 0.1 K/uL   donates blood   Patient Active Problem List   Diagnosis Date Noted  . Post-viral cough syndrome 03/10/2019  . Mild hyperlipidemia 05/04/2018  . Estrogen deficiency 04/30/2018  . Pre-operative  examination 02/21/2017  . Low back pain 06/11/2016  . Osteopenia 09/30/2013  . Encounter for routine gynecological examination 01/18/2012  . Routine general medical examination at a health care facility 01/09/2012  . [place], [place] 02/06/2010  . DEGENERATIVE JOINT DISEASE, MILD 07/19/2009   Past Medical History:  Diagnosis Date  . 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 Cancer (HCC)    BASAL CELL  . Chronic back pain   . GERD (gastroesophageal reflux disease)    OCC   Past Surgical History:  Procedure Laterality Date  . ANKLE SURGERY Right   . BASAL CELL CARCINOMA EXCISION  12/06/2017   forehead  . BREAST SURGERY  01/25/2018   Breast biopsy  . CESAREAN SECTION  [id number]   hodp pre term labor CS  . OVARIAN CYST SURGERY  1973  . SHOULDER ARTHROSCOPY Right 10/31/2017   Procedure: ARTHROSCOPY SHOULDER WITH LYSIS OF ADHESION, SUBACROMINAL DECOMPRESSION,DISTAL CLAVICLE EXCISION.;  Surgeon: Thornton Park, MD;  Location: [facility] ORS;  Service: Orthopedics;  Laterality: Right;  SHOULDER ARTHROSCOPY WITH LYSIS AND RESECTION OF ADHESIONS  . SHOULDER ARTHROSCOPY WITH OPEN ROTATOR CUFF REPAIR AND DISTAL CLAVICLE ACROMINECTOMY Right 03/19/2017   Procedure: SHOULDER ARTHROSCOPY WITH OPEN ROTATOR CUFF REPAIR AND SUBACROMINAL DECOMPRESSION;  Surgeon: Thornton Park, MD;  Location: [facility] OR[facility];  Service: Orthopedics;  Laterality: Right;  . TUBAL LIGATION  1999  . WRIST GANGLION EXCISION     Social History   Tobacco Use  . Smoking status: Never Smoker  . Smokeless tobacco: Never Used  Substance Use Topics  . Alcohol use: Yes    Alcohol/week: 4.0 standard drinks    Types: 4 Glasses of wine per week    Comment: 2-3 nights per week  . Drug use: No   Family History  Problem Relation Age of Onset  . Arthritis Mother        OA with verterabral fx  . Hypertension Mother   . Fibrocystic breast disease Mother   . Cancer Maternal Grandfather        colon CA  . Diabetes Paternal  Grandmother    Allergies  Allergen  Reactions  . Lorcet [Hydrocodone-Acetaminophen] Rash   Current Outpatient Medications on File Prior to Visit  Medication Sig Dispense Refill  . benzonatate (TESSALON) 200 MG capsule Take 1 capsule (200 mg total) by mouth 3 (three) times daily as needed for cough. Do not bite pill 30 capsule 1  . Cholecalciferol (D3-1000) 1000 units capsule Take 2,000 Units by mouth daily.     Mariane Baumgarten Calcium (STOOL SOFTENER PO) Take 2 tablets by mouth daily.     Marland Kitchen ibuprofen (ADVIL,MOTRIN) 200 MG tablet Take 400 mg by mouth 2 (two) times daily as needed for mild pain.     . meloxicam (MOBIC) 7.5 MG tablet Take 1 tablet by mouth daily as needed.  1  . Polyethyl Glycol-Propyl Glycol (SYSTANE OP) Apply 2 drops to eye as needed (dry eyes).     No current facility-administered medications on file prior to visit.     Review of Systems     Objective:   Physical Exam Constitutional:      General: She is not in acute distress.    Appearance: Normal appearance. She is well-developed and normal weight. She is not ill-appearing.  HENT:     Head: Normocephalic and atraumatic.     Right Ear: Tympanic membrane, ear canal and external ear normal.     Left Ear: Tympanic membrane, ear canal and external ear normal.     Nose: Nose normal.     Mouth/Throat:     Mouth: Mucous membranes are moist.     Pharynx: Oropharynx is clear. No posterior oropharyngeal erythema.  Eyes:     General: No scleral icterus.    Extraocular Movements: Extraocular movements intact.     Conjunctiva/sclera: Conjunctivae normal.     Pupils: Pupils are equal, round, and reactive to light.  Neck:     Musculoskeletal: Normal range of motion and neck supple. No neck rigidity or muscular tenderness.     Thyroid: No thyromegaly.     Vascular: No carotid bruit or JVD.  Cardiovascular:     Rate and Rhythm: Normal rate and regular rhythm.     Pulses: Normal pulses.     Heart sounds: Normal heart sounds. No  gallop.   Pulmonary:     Effort: Pulmonary effort is normal. No respiratory distress.     Breath sounds: Normal breath sounds. No wheezing.     Comments: Good air exch Chest:     Chest wall: No tenderness.  Abdominal:     General: Bowel sounds are normal. There is no distension or abdominal bruit.     Palpations: Abdomen is soft. There is no mass.     Tenderness: There is no abdominal tenderness. There is no left CVA tenderness.     Hernia: No hernia is present.  Genitourinary:    Comments: Breast exam: No mass, nodules, thickening, tenderness, bulging, retraction, inflamation, nipple discharge or skin changes noted.  No axillary or clavicular LA.             Anus appears normal w/o hemorrhoids or masses     External genitalia : nl appearance and hair distribution/no lesions     Urethral meatus : nl size, no lesions or prolapse     Urethra: no masses, tenderness or scarring    Bladder : no masses or tenderness     Vagina: nl general appearance, no discharge or  Lesions, no significant cystocele  or rectocele     Cervix: no lesions/ discharge or friability    Uterus:  nl size, contour, position, and mobility (not fixed) , non tender    Adnexa : no masses, tenderness, enlargement or nodularity       Musculoskeletal: Normal range of motion.        General: No tenderness.     Right lower leg: No edema.     Left lower leg: No edema.     Comments: No kyphosis   Lymphadenopathy:     Cervical: No cervical adenopathy.  Skin:    General: Skin is warm and dry.     Coloration: Skin is not pale.     Findings: No erythema or rash.  Neurological:     Mental Status: She is alert.     Cranial Nerves: No cranial nerve deficit.     Motor: No abnormal muscle tone.     Coordination: Coordination normal.     Gait: Gait normal.     Deep Tendon Reflexes: Reflexes are normal and symmetric. Reflexes normal.  Psychiatric:        Mood and Affect: Mood normal.         Cognition and Memory: Cognition and memory normal.           Assessment & Plan:   Problem List Items Addressed This Visit      Respiratory   Post-viral cough syndrome    Still a problem (from February) Not worsening and not very bothersome but curious  Nl exam  CXR ordered today  Consider pulmonary appt if no further appt      Relevant Orders   DG Chest 2 View (Completed)     Musculoskeletal and Integument   Osteopenia    dexa 6/19 osteopenia  One fall/no fractures  Taking D and ca and walking  Disc need for calcium/ vitamin D/ wt bearing exercise and bone density test every 2 y to monitor Disc safety/ fracture risk in detail          Other   Routine general medical examination at a health care facility - Primary    Reviewed health habits including diet and exercise and skin cancer prevention Reviewed appropriate screening tests for age  Also reviewed health mt list, fam hx and immunization status , as well as social and family history   See HPI Labs reviewed  Gyn exam and pap done Due for colonoscopy in October-pt aware  Will get flu shot in the fall Also check on coverage for shingrix vaccine        Encounter for routine gynecological examination    Routine exam and pap  No c/o or abn findings      Relevant Orders   Cytology - PAP([facility])   Mild hyperlipidemia    Stable with LDL of 126 Good diet Disc goals for lipids and reasons to control them Rev last labs with pt Rev low sat fat diet in detail

## 2019-08-05 NOTE — Assessment & Plan Note (Signed)
dexa 6/19 osteopenia  One fall/no fractures  Taking D and ca and walking  Disc need for calcium/ vitamin D/ wt bearing exercise and bone density test every 2 y to monitor Disc safety/ fracture risk in detail

## 2019-08-05 NOTE — Assessment & Plan Note (Signed)
Reviewed health habits including diet and exercise and skin cancer prevention Reviewed appropriate screening tests for age  Also reviewed health mt list, fam hx and immunization status , as well as social and family history   See HPI Labs reviewed  Gyn exam and pap done Due for colonoscopy in October-pt aware  Will get flu shot in the fall Also check on coverage for shingrix vaccine

## 2019-08-05 NOTE — Assessment & Plan Note (Signed)
Stable with LDL of 126 Good diet Disc goals for lipids and reasons to control them Rev last labs with pt Rev low sat fat diet in detail

## 2019-08-05 NOTE — Assessment & Plan Note (Signed)
Routine exam and pap  No c/o or abn findings

## 2019-08-05 NOTE — Assessment & Plan Note (Signed)
Still a problem (from February) Not worsening and not very bothersome but curious  Nl exam  CXR ordered today  Consider pulmonary appt if no further appt

## 2019-08-05 NOTE — Patient Instructions (Addendum)
If interested in shingrix vaccine- call and see if it is covered  If covered -we will put you on a wait list (or pharmacy)   You will be due for a colonoscopy in October   Gyn exam and pap today   Keep taking good care of yourself

## 2019-08-07 LAB — CYTOLOGY - PAP
Diagnosis: NEGATIVE
HPV: NOT DETECTED

## 2019-09-29 ENCOUNTER — Encounter: Payer: Self-pay | Admitting: Gastroenterology

## 2019-12-02 DIAGNOSIS — H5213 Myopia, bilateral: Secondary | ICD-10-CM | POA: Diagnosis not present

## 2020-04-25 DIAGNOSIS — M7122 Synovial cyst of popliteal space [Baker], left knee: Secondary | ICD-10-CM | POA: Diagnosis not present

## 2020-05-03 ENCOUNTER — Other Ambulatory Visit: Payer: Self-pay | Admitting: Orthopedic Surgery

## 2020-05-03 DIAGNOSIS — M7122 Synovial cyst of popliteal space [Baker], left knee: Secondary | ICD-10-CM

## 2020-05-04 ENCOUNTER — Ambulatory Visit
Admission: RE | Admit: 2020-05-04 | Discharge: 2020-05-04 | Disposition: A | Discharge status: Home / Self Care | Payer: Self-pay | Source: Ambulatory Visit | Attending: Orthopedic Surgery | Admitting: Orthopedic Surgery

## 2020-05-04 ENCOUNTER — Other Ambulatory Visit: Payer: Self-pay | Admitting: Orthopedic Surgery

## 2020-05-04 DIAGNOSIS — M7122 Synovial cyst of popliteal space [Baker], left knee: Secondary | ICD-10-CM

## 2020-05-09 ENCOUNTER — Other Ambulatory Visit: Payer: Self-pay

## 2020-05-09 ENCOUNTER — Ambulatory Visit
Admission: RE | Admit: 2020-05-09 | Discharge: 2020-05-09 | Disposition: A | Discharge status: Home / Self Care | Payer: 59 | Source: Ambulatory Visit | Attending: Orthopedic Surgery | Admitting: Orthopedic Surgery

## 2020-05-09 ENCOUNTER — Ambulatory Visit: Payer: 59

## 2020-05-09 ENCOUNTER — Other Ambulatory Visit: Payer: Self-pay | Admitting: Orthopedic Surgery

## 2020-05-09 DIAGNOSIS — M7122 Synovial cyst of popliteal space [Baker], left knee: Secondary | ICD-10-CM | POA: Diagnosis not present

## 2020-05-11 DIAGNOSIS — D225 Melanocytic nevi of trunk: Secondary | ICD-10-CM | POA: Diagnosis not present

## 2020-05-11 DIAGNOSIS — Z85828 Personal history of other malignant neoplasm of skin: Secondary | ICD-10-CM | POA: Diagnosis not present

## 2020-05-11 DIAGNOSIS — D2262 Melanocytic nevi of left upper limb, including shoulder: Secondary | ICD-10-CM | POA: Diagnosis not present

## 2020-05-11 DIAGNOSIS — D2261 Melanocytic nevi of right upper limb, including shoulder: Secondary | ICD-10-CM | POA: Diagnosis not present

## 2020-05-11 DIAGNOSIS — L821 Other seborrheic keratosis: Secondary | ICD-10-CM | POA: Diagnosis not present

## 2020-05-11 DIAGNOSIS — D2272 Melanocytic nevi of left lower limb, including hip: Secondary | ICD-10-CM | POA: Diagnosis not present

## 2020-05-11 DIAGNOSIS — D2271 Melanocytic nevi of right lower limb, including hip: Secondary | ICD-10-CM | POA: Diagnosis not present

## 2020-06-10 ENCOUNTER — Ambulatory Visit (INDEPENDENT_AMBULATORY_CARE_PROVIDER_SITE_OTHER): Payer: 59 | Admitting: Family Medicine

## 2020-06-10 ENCOUNTER — Other Ambulatory Visit: Payer: Self-pay

## 2020-06-10 ENCOUNTER — Ambulatory Visit (INDEPENDENT_AMBULATORY_CARE_PROVIDER_SITE_OTHER)
Admission: RE | Admit: 2020-06-10 | Discharge: 2020-06-10 | Disposition: A | Discharge status: Home / Self Care | Payer: 59 | Source: Ambulatory Visit | Attending: Family Medicine | Admitting: Family Medicine

## 2020-06-10 ENCOUNTER — Encounter: Payer: Self-pay | Admitting: Family Medicine

## 2020-06-10 VITALS — BP 104/62 | HR 72 | Temp 97.3°F | Ht 63.0 in | Wt 152.0 lb

## 2020-06-10 DIAGNOSIS — S99921A Unspecified injury of right foot, initial encounter: Secondary | ICD-10-CM | POA: Diagnosis not present

## 2020-06-10 DIAGNOSIS — S99922A Unspecified injury of left foot, initial encounter: Secondary | ICD-10-CM

## 2020-06-10 NOTE — Progress Notes (Signed)
° °  Subjective:    Patient ID: Bridget [name], female    DOB: 1956-09-29, 64 y.o.   MRN: [id number]  HPI Chief Complaint  Patient presents with   Toe Pain    Hit right little toe x 5 weeks ago, still swollen and having pain.    This is a 64 yo female who presents today with above cc. Hit base of 5th toe on a piece of furniture. Some pain with different shoes. No meds, heat/ ice.  Pain with flexion of 5th toe. Has had prior surgery x 2 on this foot. Dr. Paulla Dolly.   Review of Systems Per HPI    Objective:   Physical Exam Vitals reviewed.  Constitutional:      Appearance: Normal appearance. She is normal weight.  HENT:     Head: Normocephalic and atraumatic.  Cardiovascular:     Rate and Rhythm: Normal rate.  Pulmonary:     Effort: Pulmonary effort is normal.  Musculoskeletal:     Comments: Right foot with normal color, no edema, normal temperature, pain with extension of right 5th toe and abduction. No pain with flexion. No pain along lateral aspect.   Skin:    General: Skin is warm and dry.  Neurological:     Mental Status: She is alert.       BP 104/62 (BP Location: Left Arm, Patient Position: Sitting, Cuff Size: Normal)    Pulse 72    Temp (!) 97.3 F (36.3 C) (Temporal)    Ht 5\' 3"  (1.6 m)    Wt 152 lb (68.9 kg)    SpO2 97%    BMI 26.93 kg/m  Wt Readings from Last 3 Encounters:  06/10/20 152 lb (68.9 kg)  08/05/19 153 lb 5 oz (69.5 kg)  03/10/19 155 lb 8 oz (70.5 kg)       Assessment & Plan:  1. Foot injury, left, initial encounter - showed patient how to buddy tape, discussed low dose NSAIDs prn  - will notify her of xray results - DG Foot Complete Right; Future  This visit occurred during the SARS-CoV-2 public health emergency.  Safety protocols were in place, including screening questions prior to the visit, additional usage of staff PPE, and extensive cleaning of exam room while observing appropriate contact time as indicated for disinfecting  solutions.    Clarene Reamer, FNP-BC  [facility]  06/10/2020 9:44 AM

## 2020-06-10 NOTE — Patient Instructions (Signed)
Good to see you today  Can buddy tape your 4,5th toes together  I will notify you of results

## 2020-09-17 DIAGNOSIS — Z20828 Contact with and (suspected) exposure to other viral communicable diseases: Secondary | ICD-10-CM | POA: Diagnosis not present

## 2020-10-12 ENCOUNTER — Encounter: Payer: Self-pay | Admitting: Family Medicine

## 2020-10-12 DIAGNOSIS — M7918 Myalgia, other site: Secondary | ICD-10-CM

## 2020-10-13 DIAGNOSIS — M7918 Myalgia, other site: Secondary | ICD-10-CM | POA: Insufficient documentation

## 2020-10-13 NOTE — Telephone Encounter (Signed)
PT ref 

## 2020-10-18 ENCOUNTER — Other Ambulatory Visit: Payer: Self-pay | Admitting: Family Medicine

## 2020-10-18 DIAGNOSIS — Z1231 Encounter for screening mammogram for malignant neoplasm of breast: Secondary | ICD-10-CM

## 2020-10-20 ENCOUNTER — Ambulatory Visit: Payer: 59 | Attending: Family Medicine | Admitting: Physical Therapy

## 2020-10-20 ENCOUNTER — Other Ambulatory Visit: Payer: Self-pay

## 2020-10-20 ENCOUNTER — Encounter: Payer: Self-pay | Admitting: Physical Therapy

## 2020-10-20 DIAGNOSIS — M5441 Lumbago with sciatica, right side: Secondary | ICD-10-CM | POA: Insufficient documentation

## 2020-10-20 DIAGNOSIS — M5442 Lumbago with sciatica, left side: Secondary | ICD-10-CM | POA: Insufficient documentation

## 2020-10-20 NOTE — Therapy (Signed)
[facility] [address], [place], [address] Phone: [phone]   Fax:  [phone]  Physical Therapy Evaluation  Patient Details  Name: Bridget [name] MRN: [id number] Date of Birth: 1956-06-02 Referring Provider (PT): Roque Lias A. 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   Cancer ([place])    BASAL CELL   Chronic back pain    GERD (gastroesophageal reflux disease)    OCC    Past Surgical History:  Procedure Laterality Date   ANKLE SURGERY Right    BASAL CELL CARCINOMA EXCISION  12/06/2017   forehead   BREAST SURGERY  01/25/2018   Breast biopsy   CESAREAN SECTION  [id number]   hodp pre term labor CS   OVARIAN CYST SURGERY  1973   SHOULDER ARTHROSCOPY Right 10/31/2017   Procedure: ARTHROSCOPY SHOULDER WITH LYSIS OF ADHESION, [place].;  Surgeon: Thornton Park, MD;  Location: [facility];  Service: Orthopedics;  Laterality: Right;  SHOULDER ARTHROSCOPY WITH LYSIS AND RESECTION OF ADHESIONS   SHOULDER ARTHROSCOPY WITH OPEN ROTATOR CUFF REPAIR AND DISTAL CLAVICLE ACROMINECTOMY Right 03/19/2017   Procedure: SHOULDER ARTHROSCOPY WITH OPEN ROTATOR CUFF REPAIR AND SUBACROMINAL DECOMPRESSION;  Surgeon: Thornton Park, MD;  Location: [facility];  Service: Orthopedics;  Laterality: Right;   TUBAL LIGATION  1999   WRIST GANGLION EXCISION      There were  no vitals filed for this visit.    Subjective Assessment - 10/20/20 1740    Subjective Patient states in 2017 she had pain in her right glute after sleeping on an air mattress the wrong way. Pain never went down her leg that episode. Thought it was related to piriformis muscle and PT helped with it. Had lots of "manipulations" with treatment. She now has left sided pain that started suddenly about 1.5 weeks ago while she was standing sorting papers. Pain appeared suddenly on the left. That was on Monday 10/10/20. The only thing she had done before was planted a lot of pansies and lifted an 71 month old a few days before.  It has been a little over a week an a half and it has slightly improved but is contestant. Intermittently radiates to left lateral leg but not below knee. She has done lumbar ROM, figure 4 piriformis stretch, some leg stretching, prone press up, exercises she remembers from past PT for her R glute and low back. None of that helped this time except maybe the figure 4 stretch. Does still have some pain in the right. She is taking meloxicam or ibuprofen (takes edge off). No steroid dose pack or anything. Does not have plans to visit the doctor. Messaged her doctor requesting PT who referred her here.    Pertinent History Patient is a 64 y.o. female who  presents to outpatient physical therapy with a referral for medical diagnosis gluteal pain. This patient's chief complaints consist of acute episode of low back pain radiating the the left glute and lateral leg to the knee in the setting of chronic low back paind R glute pain leading to the following functional deficits: difficulty with usual activities including bending, lifting, amount of activities, sleeping, prolonged sitting, watching TV/relaxing, standing up from sitting, gardening, cleaning, dressing..Relevant past medical history and comorbidities include basal cell skin cancer (4 spots removed last 2 years ago - followed by Dr. Evorn Gong),  plantar fasciitis, L wrist pain, RTC repair, chronic low back pain.  Patient denies hx of stroke, seizures, lung problem, major cardiac events, diabetes, unexplained weight loss, changes in bowel or bladder problems, new onset stumbling.    Limitations Sitting;Lifting;House hold activities;Other (comment)   Functional Limitations: bending, lifting, amount of activities, sleeping, prolonged sitting, watching TV/relaxing, standing up from sitting, gardening, cleaning, dressing.   Diagnostic tests none recent    Patient Stated Goals to not have the pain    Currently in Pain? Yes    Pain Score 3    W: 8/10 (trying to roll over in bed); B: 1/10   Pain Location Back    Pain Orientation Left    Pain Descriptors / Indicators Aching;Radiating    Pain Type Acute pain    Pain Radiating Towards left base of spine intermittantly to lateral thigh but not below knee. Denies numbness or tingling except maybe at side of thgh.    Aggravating Factors  first thing in the morning, sitting too long, getting up after sitting, rolling from flat on her back to right side, lifting left leg    Pain Relieving Factors movement helps, medication (takes edge off), early afternoon, figure 4 stretch for piriformis    Effect of Pain on Daily Activities Functional Limitations: bending, lifting, amount of activities, sleeping, prolonged sitting, watching TV/relaxing, standing up from sitting, gardening, cleaning, dressing.              [facility] PT Assessment - 10/20/20 0001      Assessment   Medical Diagnosis gluteal pain    Referring Provider (PT) Marne A. Tower, MD     Onset Date/Surgical Date 10/10/20    Hand Dominance Right    Next MD Visit none scheduled    Prior Therapy no PT for this problem prior to current episode of care      Precautions   Precautions None      Restrictions   Weight Bearing Restrictions No      Balance Screen   Has the patient fallen in the past 6 months No    Has the patient had a decrease  in activity level because of a fear of falling?  No    Is the patient reluctant to leave their home because of a fear of falling?  No      Home Environment   Living Environment --   no concerns about navigating living environment     Prior Function   Level of [place] Part time employment    Vocation Requirements computer work, standing, lifting, bending    Leisure gardening, tennis      Cognition   Overall Cognitive Status Within Functional Limits for tasks assessed           OBJECTIVE  OBSERVATION/INSPECTION   Posture: hyperkyphotic at thoracic spine with slight R rib hump with global spinal flexion. Limited flexion  through lumbar spine with forward flexion.   Tremor: none  Muscle bulk: generally WFL  Bed mobility: supine <> sit and rolling WFL except cautions rolling supine to left side down due to previous pain.   Transfers: sit <> stand I with discomfort at times.   Gait: grossly WFL for household and short community ambulation. More detailed gait analysis deferred to later date as needed.    NEUROLOGICAL Upper Motor Neuron Screen Hoffman's, and Clonus (ankle) negative bilaterally Dermatomes  L3-S2 appears equal and intact to light touch. Myotomes  L3-S2 appears intact Deep Tendon Reflexes R/L   2+/2+ Quadriceps reflex (L4)  2+/2+ Achilles reflex (S1) Neurodynamic Tests    SLR = B WFL   SPINE MOTION Lumbar AROM *Indicates pain - Flexion: = mid shins, end range pain and pain upon return. Limited flexion at lumbar spine, mild R rib hump and hyperkyphosis in thoracic spine. - Extension: = 75% - Rotation: B = WFL - Side Flexion: R = 75% R pain, L = 75% R > L pain.  PERIPHERAL JOINT MOTION (in degrees) B LE WFL with mild discomfort at left hip ER  MUSCLE PERFORMANCE (MMT):  Comments: B LE WFL except slightly decreased strength with R hip adduction (4-/5)  REPEATED MOTIONS TESTING: Motion/Technique sets x reps During  After  MDT lumbar extension in prone lying (with clinician/strap overpressure for several reps 2nd set) 2x10 ERP better    SPECIAL TESTS: Straight leg raise (SLR): B = negative. FABER: R = WFL, L = lateral hip pain and decreased ROM.  ACCESSORY MOTION:  - CPA tender at low thoracic spine (non-concordant). Stiff with small cavitations to CPA throughout thoracic and upper lumbar spine.  - Concordant pain produced with CPA to L4 and L5 (not sacrum).   PALPATION: - TTP along bilateral lumbar and lower thoracic paraspinals L > R - TTP with concordant R buttock pain near piriformis - TTP concordant pain near L SIJ and upper glute muscles. Also tender at L glute med region.  - Not TTP at B quadratus femoris  EDUCATION/COGNITION: Patient is alert and oriented X 4.  Objective measurements completed on examination: See above findings.    TREATMENT:  Patient has osteopenia  Therapeutic exercise: to centralize symptoms and improve ROM, strength, muscular endurance, and activity tolerance required for successful completion of functional activities.  - MDT lumbar extension in lying 2x10 (manual overpressure for ~ 5-7 reps).  - education on improved posture with lumbar roll and avoidance of flexion temporarily.  - Education on diagnosis, prognosis, POC, anatomy and physiology of current condition.  - Education on HEP including handout    HOME EXERCISE PROGRAM  Access Code: [email or web address] URL: [email or web address] Date: 10/20/2020 Prepared by: Rosita Kea  Exercises Seated Correct Posture Prone Press Up - 6 x daily - 10-15 reps - 1 second hold Standing Lumbar Extension - 6 x daily - 1 sets - 10-15 reps - 1 second hold Standing Lumbar Extension with Counter - 6 x daily - 1 sets - 10-15 reps - 1 second hold   Patient response to treatment:  Pt tolerated treatment well. Pt was able to complete all exercises with minimal to no lasting increase in pain or discomfort and in fact  reported decreased symptoms by end of session. Pt required multimodal cuing for proper technique and to facilitate improved neuromuscular control, strength, range of motion, and functional ability resulting in improved performance and form.      PT Education - 10/20/20 1924  Education Details Exercise purpose/form. Self management techniques. Education on diagnosis, prognosis, POC, anatomy and physiology of current condition Education on HEP including handout    Person(s) Educated Patient    Methods Explanation;Demonstration;Tactile cues;Verbal cues;Handout    Comprehension Verbalized understanding;Returned demonstration;Verbal cues required;Tactile cues required;Need further instruction            PT Short Term Goals - 10/20/20 1933      PT SHORT TERM GOAL #1   Title Be independent with initial home exercise program for self-management of symptoms.    Baseline initial HEP provided at IE (10/20/2020);    Time 2    Period Weeks    Status New    Target Date 11/03/20             PT Long Term Goals - 10/20/20 1934      PT LONG TERM GOAL #1   Title Be independent with a long-term home exercise program for self-management of symptoms.    Baseline Initial HEP provided at IE (1028/2021);    Time 12    Period Weeks    Status New   TARGET DATE FOR ALL LONG TERM GOALS: 01/12/2021     PT LONG TERM GOAL #2   Title Have full lumbar spine AROM with no compensations or increase in pain in all planes except intermittent end range discomfort to allow patient to complete valued activities with less difficulty.    Baseline painful - see objective exam (10/20/2020);    Time 12    Period Weeks    Status New      PT LONG TERM GOAL #3   Title Demonstrate improved FOTO score by 10 units to demonstrate improvement in overall condition and self-reported functional ability.    Baseline to be obtained by visit 2 as appropriate (10/20/2020);    Time 12    Period Weeks    Status New      PT  LONG TERM GOAL #4   Title Patient will demonstrate sit <> stand and rolling without increase in pain to improve ability to complete basic mobility (10/20/2020);    Baseline painful and cautions (10/20/2020);    Time 12    Period Weeks    Status New      PT LONG TERM GOAL #5   Title Complete community, work and/or recreational activities without limitation due to current condition.    Baseline Functional Limitations: bending, lifting, amount of activities, sleeping, prolonged sitting, watching TV/relaxing, standing up from sitting, gardening, cleaning, dressing (10/20/2020);    Time 12    Period Weeks    Status New                  Plan - 10/20/20 1928    Clinical Impression Statement Patient is a 64 y.o. female referred to outpatient physical therapy with a medical diagnosis of gluteal pain who presents with signs and symptoms consistent with acute episode of low back pain with radiation through the left glute into the thigh in the setting of chronic low back pain with radiation to right glute. Patient presents with significant pain, ROM, joint stiffness, activity tolerance, posture, muscle performance (strength/power/endurance) impairments that are limiting ability to complete her usual activities including bending, lifting, amount of activities, sleeping, prolonged sitting, watching TV/relaxing, standing up from sitting, gardening, cleaning, dressing without difficulty. Patient will benefit from skilled physical therapy intervention to address current body structure impairments and activity limitations to improve function and work towards goals set in current POC  in order to return to prior level of function or maximal functional improvement.    Personal Factors and Comorbidities Age;Comorbidity 3+;Past/Current Experience;Time since onset of injury/illness/exacerbation    Comorbidities Relevant past medical history and comorbidities include basal cell skin cancer (4 spots removed last 2  years ago - followed by Dr. Evorn Gong), plantar fasciitis, L wrist pain, RTC repair, chronic low back pain.    Examination-Activity Limitations Bed Mobility;Bend;Lift;Hygiene/Grooming;Squat;Caring for Others;Sit;Sleep;Transfers;Carry    Examination-Participation Restrictions Cleaning;Yard Work   bending, lifting, amount of activities, sleeping, prolonged sitting, watching TV/relaxing, standing up from sitting, gardening, cleaning, dressing.   Stability/Clinical Decision Making Stable/Uncomplicated    Clinical Decision Making Low    Rehab Potential Good    PT Frequency 2x / week    PT Duration 12 weeks    PT Treatment/Interventions Patient/family education;Electrical Stimulation;Cryotherapy;Moist Heat;Neuromuscular re-education;Therapeutic exercise;Manual techniques;ADLs/Self Care Home Management;Therapeutic activities;Dry needling;Passive range of motion;Joint Manipulations;Spinal Manipulations    PT Next Visit Plan assess response to HEP and update as appropriate    PT Home Exercise Plan Medbridge Access Code: [email or web address]    Consulted and Agree with Plan of Care Patient           Patient will benefit from skilled therapeutic intervention in order to improve the following deficits and impairments:  Pain, Decreased mobility, Increased muscle spasms, Postural dysfunction, Decreased activity tolerance, Decreased endurance, Decreased range of motion, Decreased strength, Hypomobility, Impaired perceived functional ability  Visit Diagnosis: Left-sided low back pain with bilateral sciatica, unspecified chronicity     Problem List Patient Active Problem List   Diagnosis Date Noted   Gluteal pain 10/13/2020   Post-viral cough syndrome 03/10/2019   Mild hyperlipidemia 05/04/2018   Estrogen deficiency 04/30/2018   Pre-operative examination 02/21/2017   Low back pain 06/11/2016   Osteopenia 09/30/2013   Encounter for routine gynecological examination 01/18/2012   Routine general medical  examination at a health care facility 01/09/2012   FASCIITIS, PLANTAR 02/06/2010   DEGENERATIVE JOINT DISEASE, MILD 07/19/2009    Everlean Alstrom. Graylon Good, PT, DPT 10/20/20, 7:38 PM  [facility] [address], [place], [address] Phone: [phone]   Fax:  [phone]  Name: Bridget [name] MRN: [id number] Date of Birth: 1956/11/18

## 2020-10-24 ENCOUNTER — Ambulatory Visit: Payer: 59 | Attending: Family Medicine | Admitting: Physical Therapy

## 2020-10-24 ENCOUNTER — Other Ambulatory Visit: Payer: Self-pay

## 2020-10-24 ENCOUNTER — Encounter: Payer: Self-pay | Admitting: Physical Therapy

## 2020-10-24 DIAGNOSIS — M5442 Lumbago with sciatica, left side: Secondary | ICD-10-CM | POA: Insufficient documentation

## 2020-10-24 DIAGNOSIS — M5441 Lumbago with sciatica, right side: Secondary | ICD-10-CM | POA: Insufficient documentation

## 2020-10-24 NOTE — Therapy (Signed)
[facility] [address] [place], [address] Phone: [phone]   Fax:  [phone]  Physical Therapy Treatment  Patient Details  Name: Bridget [name] MRN: [id number] Date of Birth: 04/17/1956 Referring Provider (PT): Roque Lias A. Tower, MD    Encounter Date: 10/24/2020   PT End of Session - 10/24/20 1713    Visit Number 2    Number of Visits 24    Date for PT Re-Evaluation 01/12/21    Authorization Type [place] [employer]MR reporting period from 10/20/2020    Progress Note Due on Visit 10    PT Start Time 1709    PT Stop Time 1754    PT Time Calculation (min) 45 min    Activity Tolerance Patient tolerated treatment well    Behavior During Therapy [facility] for tasks assessed/performed           Past Medical History:  Diagnosis Date  . 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 Cancer (HCC)    BASAL CELL  . Chronic back pain   . GERD (gastroesophageal reflux disease)    OCC    Past Surgical History:  Procedure Laterality Date  . ANKLE SURGERY Right   . BASAL CELL CARCINOMA EXCISION  12/06/2017   forehead  . BREAST SURGERY  01/25/2018   Breast biopsy  . CESAREAN SECTION  [phone]   hodp pre term labor CS  . OVARIAN CYST SURGERY  1973  . SHOULDER ARTHROSCOPY Right 10/31/2017   Procedure: ARTHROSCOPY SHOULDER WITH LYSIS OF ADHESION, SUBACROMINAL DECOMPRESSION,DISTAL CLAVICLE EXCISION.;  Surgeon: Thornton Park, MD;  Location: [facility] ORS;  Service: Orthopedics;  Laterality: Right;  SHOULDER ARTHROSCOPY WITH LYSIS AND RESECTION OF ADHESIONS  . SHOULDER ARTHROSCOPY WITH OPEN ROTATOR CUFF REPAIR AND DISTAL CLAVICLE ACROMINECTOMY Right 03/19/2017   Procedure: SHOULDER ARTHROSCOPY WITH OPEN ROTATOR CUFF REPAIR AND SUBACROMINAL DECOMPRESSION;  Surgeon: Thornton Park, MD;  Location: [facility];  Service: Orthopedics;  Laterality: Right;  . TUBAL LIGATION  1999  . WRIST GANGLION EXCISION      There were no  vitals filed for this visit.   Subjective Assessment - 10/24/20 1708    Subjective Patient reports she had a rough weekend. She rode in the car about 3 hours to [place] on Friday and then back yesterday. She felt pretty good following her initial eval but had a lot of pain more down her leg that kept her up while in [place] and is worse today. She has a 4/10 pain currently on the left low back that frequently radiates to the left lateral leg near the knee.  Is unsure if any of the exercises from her HEP helped and she generally feels better during the day when up and moving. She did use a lumbar roll when in the car. had to stop several times during her trip due to back pain while riding in the car.    Pertinent History Patient is a 64 y.o. female who presents to outpatient physical therapy with a referral for medical diagnosis gluteal pain. This patient's chief complaints consist of acute episode of low back pain radiating the the left glute and lateral leg to the knee in the setting of chronic low back paind R glute pain leading to the following functional deficits: difficulty with usual activities including bending, lifting, amount of activities, sleeping, prolonged sitting, watching TV/relaxing, standing up from sitting, gardening, cleaning, dressing..Relevant past medical history and comorbidities include basal cell skin cancer (  4 spots removed last 2 years ago - followed by Dr. Evorn Gong), plantar fasciitis, L wrist pain, RTC repair, chronic low back pain.  Patient denies hx of stroke, seizures, lung problem, major cardiac events, diabetes, unexplained weight loss, changes in bowel or bladder problems, new onset stumbling.    Limitations Sitting;Lifting;House hold activities;Other (comment)   Functional Limitations: bending, lifting, amount of activities, sleeping, prolonged sitting, watching TV/relaxing, standing up from sitting, gardening, cleaning, dressing.   Diagnostic tests none recent    Patient Stated Goals  to not have the pain    Currently in Pain? Yes    Pain Score 4     Pain Location Back    Pain Orientation Left          OBJECTIVE FOTO = 65 (10/24/2020)     TREATMENT:  Patient has osteopenia  Therapeutic exercise:to centralize symptoms and improve ROM, strength, muscular endurance, and activity tolerance required for successful completion of functional activities.  - MDT lumbar extension in lying 2x10 (manual overpressure at L4-L5 reps end set).  - standing lumbar extension over counter x 15 (manual therapy - see below) - MDT lumbar extension in prone lying 1x10.  - supine L sciatic nerve glide, slider technique x 15, mild tensioner x 5 (does not reproduce concordant pain).  - education on hamstring stretch, directional preference.   Manual therapy: to reduce pain and tissue tension, improve range of motion, neuromodulation, in order to promote improved ability to complete functional activities. Patient Prone:  - CPA with and without EK wedge grade II-IV at thoracic and lumbar spine, focusing on most painful locations at L4 and L5.  - left UPA and alternating L/R UPA at lowest lumbar segments (L3-L5) with bias towards left side. Grade II-IV.  - STM to lumbar paraspinals and left glute region. Very tender in superior glute max fibers with tenderness noted in glute med that briefly reproduced some lateral leg pain.    HOME EXERCISE PROGRAM  Access Code: [email or web address] URL: [email or web address] Date: 10/20/2020 Prepared by: Rosita Kea  Exercises Seated Correct Posture Prone Press Up - 6 x daily - 10-15 reps - 1 second hold Standing Lumbar Extension - 6 x daily - 1 sets - 10-15 reps - 1 second hold Standing Lumbar Extension with Counter - 6 x daily - 1 sets - 10-15 reps - 1 second hold    PT Education - 10/24/20 1713    Education Details Exercise purpose/form. Self management techniques.    Person(s) Educated Patient    Methods  Explanation;Demonstration;Tactile cues;Verbal cues    Comprehension Verbalized understanding;Returned demonstration;Verbal cues required;Tactile cues required;Need further instruction            PT Short Term Goals - 10/20/20 1933      PT SHORT TERM GOAL #1   Title Be independent with initial home exercise program for self-management of symptoms.    Baseline initial HEP provided at IE (10/20/2020);    Time 2    Period Weeks    Status New    Target Date 11/03/20             PT Long Term Goals - 10/20/20 1934      PT LONG TERM GOAL #1   Title Be independent with a long-term home exercise program for self-management of symptoms.    Baseline Initial HEP provided at IE (1028/2021);    Time 12    Period Weeks    Status New   TARGET DATE FOR ALL LONG TERM GOALS:  01/12/2021     PT LONG TERM GOAL #2   Title Have full lumbar spine AROM with no compensations or increase in pain in all planes except intermittent end range discomfort to allow patient to complete valued activities with less difficulty.    Baseline painful - see objective exam (10/20/2020);    Time 12    Period Weeks    Status New      PT LONG TERM GOAL #3   Title Demonstrate improved FOTO score by 10 units to demonstrate improvement in overall condition and self-reported functional ability.    Baseline to be obtained by visit 2 as appropriate (10/20/2020);    Time 12    Period Weeks    Status New      PT LONG TERM GOAL #4   Title Patient will demonstrate sit <> stand and rolling without increase in pain to improve ability to complete basic mobility (10/20/2020);    Baseline painful and cautions (10/20/2020);    Time 12    Period Weeks    Status New      PT LONG TERM GOAL #5   Title Complete community, work and/or recreational activities without limitation due to current condition.    Baseline Functional Limitations: bending, lifting, amount of activities, sleeping, prolonged sitting, watching TV/relaxing,  standing up from sitting, gardening, cleaning, dressing (10/20/2020);    Time 12    Period Weeks    Status New                 Plan - 10/24/20 1951    Clinical Impression Statement Patient tolerated treatment well overall but did not demonstrate any significant centralization, peripheralization or changes in pain during or by end of session. Continued to have left leg pain that she noted is now appearing near the lateral lower leg as well as the knee. Did report feeling more flexible and able to move better by end of session. Did not have a significant response to nerve glide. Plan to continue with further exploration of specific movements and/or soft tissue next session as appropriate. Patient would benefit from continued management of limiting condition by skilled physical therapist to address remaining impairments and functional limitations to work towards stated goals and return to PLOF or maximal functional independence.    Personal Factors and Comorbidities Age;Comorbidity 3+;Past/Current Experience;Time since onset of injury/illness/exacerbation    Comorbidities Relevant past medical history and comorbidities include basal cell skin cancer (4 spots removed last 2 years ago - followed by Dr. Evorn Gong), plantar fasciitis, L wrist pain, RTC repair, chronic low back pain.    Examination-Activity Limitations Bed Mobility;Bend;Lift;Hygiene/Grooming;Squat;Caring for Others;Sit;Sleep;Transfers;Carry    Examination-Participation Restrictions Cleaning;Yard Work   bending, lifting, amount of activities, sleeping, prolonged sitting, watching TV/relaxing, standing up from sitting, gardening, cleaning, dressing.   Stability/Clinical Decision Making Stable/Uncomplicated    Rehab Potential Good    PT Frequency 2x / week    PT Duration 12 weeks    PT Treatment/Interventions Patient/family education;Electrical Stimulation;Cryotherapy;Moist Heat;Neuromuscular re-education;Therapeutic exercise;Manual  techniques;ADLs/Self Care Home Management;Therapeutic activities;Dry needling;Passive range of motion;Joint Manipulations;Spinal Manipulations    PT Next Visit Plan assess response to HEP and update as appropriate    PT Home Exercise Plan Medbridge Access Code: [email or web address]    Consulted and Agree with Plan of Care Patient           Patient will benefit from skilled therapeutic intervention in order to improve the following deficits and impairments:  Pain, Decreased mobility, Increased muscle spasms, Postural dysfunction, Decreased  activity tolerance, Decreased endurance, Decreased range of motion, Decreased strength, Hypomobility, Impaired perceived functional ability  Visit Diagnosis: Left-sided low back pain with bilateral sciatica, unspecified chronicity     Problem List Patient Active Problem List   Diagnosis Date Noted  . Gluteal pain 10/13/2020  . Post-viral cough syndrome 03/10/2019  . Mild hyperlipidemia 05/04/2018  . Estrogen deficiency 04/30/2018  . Pre-operative examination 02/21/2017  . Low back pain 06/11/2016  . Osteopenia 09/30/2013  . Encounter for routine gynecological examination 01/18/2012  . Routine general medical examination at a health care facility 01/09/2012  . [place] 02/06/2010  . DEGENERATIVE JOINT DISEASE, MILD 07/19/2009    Everlean Alstrom. Graylon Good, PT, DPT 10/24/20, 7:52 PM  [place] [facility] [address] [place], [address] Phone: [phone]   Fax:  [phone]  Name: Bridget [name] MRN: [id number] Date of Birth: 1956/04/17

## 2020-10-26 ENCOUNTER — Ambulatory Visit: Payer: 59 | Admitting: Physical Therapy

## 2020-10-26 ENCOUNTER — Other Ambulatory Visit: Payer: Self-pay

## 2020-10-26 ENCOUNTER — Encounter: Payer: Self-pay | Admitting: Physical Therapy

## 2020-10-26 DIAGNOSIS — M5442 Lumbago with sciatica, left side: Secondary | ICD-10-CM | POA: Diagnosis not present

## 2020-10-26 DIAGNOSIS — M5441 Lumbago with sciatica, right side: Secondary | ICD-10-CM | POA: Diagnosis not present

## 2020-10-26 NOTE — Therapy (Signed)
[place] [address] [place], [address] Phone: [phone]   Fax:  [phone]  Physical Therapy Treatment  Patient Details  Name: Bridget [name] MRN: [id number] Date of Birth: [birth year] Referring Provider (PT): Roque Lias A. Tower, MD    Encounter Date: 10/26/2020   PT End of Session - 10/26/20 1500    Visit Number 3    Number of Visits 24    Date for PT Re-Evaluation 01/12/21    Authorization Type [place] [employer]MR reporting period from 10/20/2020    Progress Note Due on Visit 10    PT Start Time 1300    PT Stop Time 1345    PT Time Calculation (min) 45 min    Equipment Utilized During Treatment Gait belt    Activity Tolerance Patient tolerated treatment well    Behavior During Therapy WFL for tasks assessed/performed           Past Medical History:  Diagnosis Date  . 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 Cancer (HCC)    BASAL CELL  . Chronic back pain   . GERD (gastroesophageal reflux disease)    OCC    Past Surgical History:  Procedure Laterality Date  . ANKLE SURGERY Right   . BASAL CELL CARCINOMA EXCISION  12/06/2017   forehead  . BREAST SURGERY  01/25/2018   Breast biopsy  . CESAREAN SECTION  [phone]   hodp pre term labor CS  . OVARIAN CYST SURGERY  1973  . SHOULDER ARTHROSCOPY Right 10/31/2017   Procedure: ARTHROSCOPY SHOULDER WITH LYSIS OF ADHESION, SUBACROMINAL DECOMPRESSION,DISTAL CLAVICLE EXCISION.;  Surgeon: Thornton Park, MD;  Location: [facility];  Service: Orthopedics;  Laterality: Right;  SHOULDER ARTHROSCOPY WITH LYSIS AND RESECTION OF ADHESIONS  . SHOULDER ARTHROSCOPY WITH OPEN ROTATOR CUFF REPAIR AND DISTAL CLAVICLE ACROMINECTOMY Right 03/19/2017   Procedure: SHOULDER ARTHROSCOPY WITH OPEN ROTATOR CUFF REPAIR AND SUBACROMINAL DECOMPRESSION;  Surgeon: Thornton Park, MD;  Location: [facility];  Service: Orthopedics;  Laterality: Right;  . TUBAL LIGATION  1999   . WRIST GANGLION EXCISION      There were no vitals filed for this visit.   Subjective Assessment - 10/26/20 1304    Subjective Patient report that monday night was her worst night and she was up until 4am due to pain down the left leg. Currently it feels better but not as good as it has before. Last night she slept better but was still awoken by 5 am. She is using a heating pad and trying anything. Had to get up and started walking. Walking consistently makes it feel better. Reports current pain number at 4-5/10 with the lowest spot of pain at her mid lower leg leg at the lateral aspect. This location is 3/10. She has been doing her exercises but did not do very many yesterday. She did them standing up and against a counter. She also did a lot of calf stretches and hip flexion stretches yesterday.    Pertinent History Patient is a 64 y.o. female who presents to outpatient physical therapy with a referral for medical diagnosis gluteal pain. This patient's chief complaints consist of acute episode of low back pain radiating the the left glute and lateral leg to the knee in the setting of chronic low back paind R glute pain leading to the following functional deficits: difficulty with usual activities including bending, lifting, amount of activities, sleeping, prolonged sitting, watching TV/relaxing, standing up from sitting,  gardening, cleaning, dressing..Relevant past medical history and comorbidities include basal cell skin cancer (4 spots removed last 2 years ago - followed by Dr. Evorn Gong), plantar fasciitis, L wrist pain, RTC repair, chronic low back pain.  Patient denies hx of stroke, seizures, lung problem, major cardiac events, diabetes, unexplained weight loss, changes in bowel or bladder problems, new onset stumbling.    Limitations Sitting;Lifting;House hold activities;Other (comment)   Functional Limitations: bending, lifting, amount of activities, sleeping, prolonged sitting, watching  TV/relaxing, standing up from sitting, gardening, cleaning, dressing.   Diagnostic tests none recent    Patient Stated Goals to not have the pain    Currently in Pain? Yes    Pain Score 5     Pain Location Back    Pain Radiating Towards to left lateral lower leg           OBJECTIVE FOTO = 65 (10/24/2020)  TREATMENT: Patient has osteopenia  Therapeutic exercise:to centralize symptoms and improve ROM, strength, muscular endurance, and activity tolerance required for successful completion of functional activities. - standing left sideglide at wall (R hip to wall) x10, with yoga block used as a spacer between elbow and wall x 10. No effect.  - Prone hip extension with knee flexed to preferentially bias gluteus maximus. 10x10 Cuing to keep knee flexed. Right lumbar paraspinals heavily activated with left LE movement and also most active with R LE movement. L lumbar paraspinals with decreased activation with R LE movement. Patient felt compression in the low back, was cued to activate abdominals more but felt she could only do two sets. Added pillow under hips for 5 more reps each side with no improvement in pain or activation pattern. Patient reports her leg feels better once she stood up.  - standing monster walks with theraband around ankles and CGA for safety. Forward x 22 feet with red theraband (patient reports she feels she will be sore - not concordant pain). Backwards 22 feet with yellow theraband (more comfortable), forward 5 feet with yellow theraband (better than red but states she already feels sore from previously).  - Education on HEP including handout   Manual therapy: to reduce pain and tissue tension, improve range of motion, neuromodulation, in order to promote improved ability to complete functional activities. - SIJ stress tests, all negative for concordant pain: SIJ compression, SIJ distraction, R/L thigh thrust, sacral thrust, R/L Gaenslen's test.  - futher hip  assessment: B FABER negative, long axis distraction of left hip feels sort of good but with tenderness at lateral hip, active/resisted L hip abduction not painful, L FADDIR painful but not concordant. R hip IR lacking compared to left but FADDIR negative.  - Palpation to posterior fibers of glute med and superior fibers of glute max TTP and reproduces pain. Very gentle today and did not continue with STM to this region.    HOME EXERCISE PROGRAM Access Code: [email or web address] URL: [email or web address] Date: 10/26/2020 Prepared by: Rosita Kea  Exercises Seated Correct Posture Prone Press Up - 6 x daily - 10-15 reps - 1 second hold Standing Lumbar Extension - 6 x daily - 1 sets - 10-15 reps - 1 second hold Standing Lumbar Extension with Counter - 6 x daily - 1 sets - 10-15 reps - 1 second hold Prone Hip Extension with Bent Knee - 1 x daily - 2 sets - 10 reps - 1-2 seconds hold    PT Education - 10/26/20 1500    Education Details Exercise purpose/form. Self management techniques  Person(s) Educated Patient    Methods Explanation;Demonstration;Tactile cues;Verbal cues;Handout    Comprehension Verbalized understanding;Returned demonstration;Verbal cues required;Tactile cues required;Need further instruction            PT Short Term Goals - 10/26/20 1459      PT SHORT TERM GOAL #1   Title Be independent with initial home exercise program for self-management of symptoms.    Baseline initial HEP provided at IE (10/20/2020);    Time 2    Period Weeks    Status Achieved    Target Date 11/03/20             PT Long Term Goals - 10/20/20 1934      PT LONG TERM GOAL #1   Title Be independent with a long-term home exercise program for self-management of symptoms.    Baseline Initial HEP provided at IE (1028/2021);    Time 12    Period Weeks    Status New   TARGET DATE FOR ALL LONG TERM GOALS: 01/12/2021     PT LONG TERM GOAL #2   Title Have full lumbar spine AROM with no  compensations or increase in pain in all planes except intermittent end range discomfort to allow patient to complete valued activities with less difficulty.    Baseline painful - see objective exam (10/20/2020);    Time 12    Period Weeks    Status New      PT LONG TERM GOAL #3   Title Demonstrate improved FOTO score by 10 units to demonstrate improvement in overall condition and self-reported functional ability.    Baseline to be obtained by visit 2 as appropriate (10/20/2020);    Time 12    Period Weeks    Status New      PT LONG TERM GOAL #4   Title Patient will demonstrate sit <> stand and rolling without increase in pain to improve ability to complete basic mobility (10/20/2020);    Baseline painful and cautions (10/20/2020);    Time 12    Period Weeks    Status New      PT LONG TERM GOAL #5   Title Complete community, work and/or recreational activities without limitation due to current condition.    Baseline Functional Limitations: bending, lifting, amount of activities, sleeping, prolonged sitting, watching TV/relaxing, standing up from sitting, gardening, cleaning, dressing (10/20/2020);    Time 12    Period Weeks    Status New                 Plan - 10/26/20 1458    Clinical Impression Statement Patient appeared to feel worse following last treatment session over night. Patient felt that extension exercises are helpful and wanted to continue with them. No vigorous extension performed in clinic today and patient encouraged to continue them at home as she felt comfortable. Patient felt that STM the sore region may have lead to increased pain, so only performed light palpation there today. Further assessed SIJ and hip joint today and did not find concordant pain with any tests for these regions. Painful region in the glute region is more superior and lateral than usual for radicular symptoms and patient responded well to hip extension exercise. Also noted to have uneven  lumbar paraspinal activation with hip extension and poor differentiation of hip hip vs trunk during standing hip strengthening activities. Plan to continue with hip and glute strengthening and lumbopelvic control exercises next session. Patient would benefit from continued management of limiting  condition by skilled physical therapist to address remaining impairments and functional limitations to work towards stated goals and return to PLOF or maximal functional independence.    Personal Factors and Comorbidities Age;Comorbidity 3+;Past/Current Experience;Time since onset of injury/illness/exacerbation    Comorbidities Relevant past medical history and comorbidities include basal cell skin cancer (4 spots removed last 2 years ago - followed by Dr. Evorn Gong), plantar fasciitis, L wrist pain, RTC repair, chronic low back pain.    Examination-Activity Limitations Bed Mobility;Bend;Lift;Hygiene/Grooming;Squat;Caring for Others;Sit;Sleep;Transfers;Carry    Examination-Participation Restrictions Cleaning;Yard Work   bending, lifting, amount of activities, sleeping, prolonged sitting, watching TV/relaxing, standing up from sitting, gardening, cleaning, dressing.   Stability/Clinical Decision Making Stable/Uncomplicated    Rehab Potential Good    PT Frequency 2x / week    PT Duration 12 weeks    PT Treatment/Interventions Patient/family education;Electrical Stimulation;Cryotherapy;Moist Heat;Neuromuscular re-education;Therapeutic exercise;Manual techniques;ADLs/Self Care Home Management;Therapeutic activities;Dry needling;Passive range of motion;Joint Manipulations;Spinal Manipulations    PT Next Visit Plan assess response to HEP and update as appropriate    PT Home Exercise Plan Medbridge Access Code: [email or web address]    Consulted and Agree with Plan of Care Patient           Patient will benefit from skilled therapeutic intervention in order to improve the following deficits and impairments:  Pain, Decreased  mobility, Increased muscle spasms, Postural dysfunction, Decreased activity tolerance, Decreased endurance, Decreased range of motion, Decreased strength, Hypomobility, Impaired perceived functional ability  Visit Diagnosis: Left-sided low back pain with bilateral sciatica, unspecified chronicity     Problem List Patient Active Problem List   Diagnosis Date Noted  . Gluteal pain 10/13/2020  . Post-viral cough syndrome 03/10/2019  . Mild hyperlipidemia 05/04/2018  . Estrogen deficiency 04/30/2018  . Pre-operative examination 02/21/2017  . Low back pain 06/11/2016  . Osteopenia 09/30/2013  . Encounter for routine gynecological examination 01/18/2012  . Routine general medical examination at a health care facility 01/09/2012  . [place], [place] 02/06/2010  . DEGENERATIVE JOINT DISEASE, MILD 07/19/2009    Everlean Alstrom. Graylon Good, PT, DPT 10/26/20, 3:01 PM  [facility] [address], [place], [address] Phone: [phone]   Fax:  [phone]  Name: Amesha Bailey MRN: [id number] Date of Birth: 1956/02/08

## 2020-10-31 ENCOUNTER — Encounter: Payer: Self-pay | Admitting: Physical Therapy

## 2020-10-31 ENCOUNTER — Ambulatory Visit: Payer: 59 | Admitting: Physical Therapy

## 2020-10-31 ENCOUNTER — Other Ambulatory Visit: Payer: Self-pay

## 2020-10-31 DIAGNOSIS — M5441 Lumbago with sciatica, right side: Secondary | ICD-10-CM

## 2020-10-31 DIAGNOSIS — M5442 Lumbago with sciatica, left side: Secondary | ICD-10-CM | POA: Diagnosis not present

## 2020-10-31 NOTE — Therapy (Signed)
[facility] [address], [place], [address] Phone: [phone]   Fax:  [phone]  Physical Therapy Treatment  Patient Details  Name: Bridget [name] MRN: [id number] Date of Birth: March 04, 1956 Referring Provider (PT): Roque Lias A. Tower, MD    Encounter Date: 10/31/2020   PT End of Session - 10/31/20 2004    Visit Number 4    Number of Visits 24    Date for PT Re-Evaluation 01/12/21    Authorization Type [place] [employer] reporting period from 10/20/2020    Progress Note Due on Visit 10    PT Start Time 1630    PT Stop Time 1710    PT Time Calculation (min) 40 min    Equipment Utilized During Treatment Gait belt    Activity Tolerance Patient tolerated treatment well    Behavior During Therapy WFL for tasks assessed/performed           Past Medical History:  Diagnosis Date  . 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 Cancer (HCC)    BASAL CELL  . Chronic back pain   . GERD (gastroesophageal reflux disease)    OCC    Past Surgical History:  Procedure Laterality Date  . ANKLE SURGERY Right   . BASAL CELL CARCINOMA EXCISION  12/06/2017   forehead  . BREAST SURGERY  01/25/2018   Breast biopsy  . CESAREAN SECTION  [phone]   hodp pre term labor CS  . OVARIAN CYST SURGERY  1973  . SHOULDER ARTHROSCOPY Right 10/31/2017   Procedure: ARTHROSCOPY SHOULDER WITH LYSIS OF ADHESION, SUBACROMINAL DECOMPRESSION,DISTAL CLAVICLE EXCISION.;  Surgeon: Thornton Park, MD;  Location: [facility];  Service: Orthopedics;  Laterality: Right;  SHOULDER ARTHROSCOPY WITH LYSIS AND RESECTION OF ADHESIONS  . SHOULDER ARTHROSCOPY WITH OPEN ROTATOR CUFF REPAIR AND DISTAL CLAVICLE ACROMINECTOMY Right 03/19/2017   Procedure: SHOULDER ARTHROSCOPY WITH OPEN ROTATOR CUFF REPAIR AND SUBACROMINAL DECOMPRESSION;  Surgeon: Thornton Park, MD;  Location: [facility];  Service: Orthopedics;  Laterality: Right;  . TUBAL LIGATION  1999   . WRIST GANGLION EXCISION      There were no vitals filed for this visit.   Subjective Assessment - 10/31/20 1633    Subjective Patient report she is feeling "not too bad." States she felt pretty good when she left PT but had trouble during the night. It is going up and down as far as how it is feeling. Last night she slept almost all night, which was an improvement. Didn't do much different last night except singing in church. Kept doing different stretchy things but did prone hip extension last night and added tylenol last night. Reports current pain 2/10  worst in the right piriformis region and left lateral hip. It has taken tylenol at 7:45 this morning.    Pertinent History Patient is a 64 y.o. female who presents to outpatient physical therapy with a referral for medical diagnosis gluteal pain. This patient's chief complaints consist of acute episode of low back pain radiating the the left glute and lateral leg to the knee in the setting of chronic low back paind R glute pain leading to the following functional deficits: difficulty with usual activities including bending, lifting, amount of activities, sleeping, prolonged sitting, watching TV/relaxing, standing up from sitting, gardening, cleaning, dressing..Relevant past medical history and comorbidities include basal cell skin cancer (4 spots removed last 2 years ago - followed by Dr. Evorn Gong), plantar fasciitis, L wrist pain, RTC repair,  chronic low back pain.  Patient denies hx of stroke, seizures, lung problem, major cardiac events, diabetes, unexplained weight loss, changes in bowel or bladder problems, new onset stumbling.    Limitations Sitting;Lifting;House hold activities;Other (comment)   Functional Limitations: bending, lifting, amount of activities, sleeping, prolonged sitting, watching TV/relaxing, standing up from sitting, gardening, cleaning, dressing.   Diagnostic tests none recent    Patient Stated Goals to not have the pain     Currently in Pain? Yes    Pain Score 3            TREATMENT: Patient has osteopenia  Therapeutic exercise:to centralize symptoms and improve ROM, strength, muscular endurance, and activity tolerance required for successful completion of functional activities. - Prone hip extension with knee flexed to preferentially bias gluteus maximus. 3x10 each side, cuing to keep knee flexed.  - supine nerve tensioner/HS mobilization 1x10 each side with towel at ball of foot.  - supine horizontal hip adduction/abduction with tensioned hamstring x10 each side. -Standing sidestepping with red Thera-Band tied around ankles and hips and knees slightly flexed.  1 x 20 feet each direction.  Contact guard assist for safety.  Cueing for lower extremity dissociation from trunk. - standing monster walks with theraband around ankles and CGA for safety. Forward 2x 20 feet with red theraband   Manual therapy: to reduce pain and tissue tension, improve range of motion, neuromodulation, in order to promote improved ability to complete functional activities. - Palpation to posterior fibers of glute med and superior fibers of glute max TTP and reproduces pain bilaterally. Very gentle today and did not continue with STM to this region.  -Prone CPA to lower thoracic and all lumbar segments, grade II-IV, cavitations at several thoracic levels -Supine long axis distraction through each hip 3x 20 seconds each side using mobilization belt.   Pt required multimodal cuing for proper technique and to facilitate improved neuromuscular control, strength, range of motion, and functional ability resulting in improved performance and form.  HOME EXERCISE PROGRAM Access Code: [email or web address] URL: [email or web address] Date: 10/26/2020 Prepared by: Rosita Kea  Exercises Seated Correct Posture Prone Press Up - 6 x daily - 10-15 reps - 1 second hold Standing Lumbar Extension - 6 x daily - 1 sets - 10-15 reps - 1 second  hold Standing Lumbar Extension with Counter - 6 x daily - 1 sets - 10-15 reps - 1 second hold Prone Hip Extension with Bent Knee - 1 x daily - 2 sets - 10 reps - 1-2 seconds hold     PT Education - 10/31/20 1630    Education Details Exercise purpose/form. Self management techniques    Person(s) Educated Patient    Methods Explanation;Demonstration;Tactile cues;Verbal cues    Comprehension Verbalized understanding;Returned demonstration;Verbal cues required;Tactile cues required;Need further instruction            PT Short Term Goals - 10/26/20 1459      PT SHORT TERM GOAL #1   Title Be independent with initial home exercise program for self-management of symptoms.    Baseline initial HEP provided at IE (10/20/2020);    Time 2    Period Weeks    Status Achieved    Target Date 11/03/20             PT Long Term Goals - 10/20/20 1934      PT LONG TERM GOAL #1   Title Be independent with a long-term home exercise program for self-management of symptoms.    Baseline Initial  HEP provided at IE (1028/2021);    Time 12    Period Weeks    Status New   TARGET DATE FOR ALL LONG TERM GOALS: 01/12/2021     PT LONG TERM GOAL #2   Title Have full lumbar spine AROM with no compensations or increase in pain in all planes except intermittent end range discomfort to allow patient to complete valued activities with less difficulty.    Baseline painful - see objective exam (10/20/2020);    Time 12    Period Weeks    Status New      PT LONG TERM GOAL #3   Title Demonstrate improved FOTO score by 10 units to demonstrate improvement in overall condition and self-reported functional ability.    Baseline to be obtained by visit 2 as appropriate (10/20/2020);    Time 12    Period Weeks    Status New      PT LONG TERM GOAL #4   Title Patient will demonstrate sit <> stand and rolling without increase in pain to improve ability to complete basic mobility (10/20/2020);    Baseline painful and  cautions (10/20/2020);    Time 12    Period Weeks    Status New      PT LONG TERM GOAL #5   Title Complete community, work and/or recreational activities without limitation due to current condition.    Baseline Functional Limitations: bending, lifting, amount of activities, sleeping, prolonged sitting, watching TV/relaxing, standing up from sitting, gardening, cleaning, dressing (10/20/2020);    Time 12    Period Weeks    Status New                 Plan - 10/31/20 2012    Clinical Impression Statement Patient tolerated treatment well with no lasting  increase in symptoms throughout session.  Continue to focus on hip and low back strengthening and manual therapy interventions for pain control with good results.  Patient appears to be gradually improving but is not yet return to prior level of function.  Patient would would benefit from continued physical therapy to address remaining impairments and functional limitations and to continue working towards goals set in plan of care to reach maximal improvement in function or return to prior level of function.    Personal Factors and Comorbidities Age;Comorbidity 3+;Past/Current Experience;Time since onset of injury/illness/exacerbation    Comorbidities Relevant past medical history and comorbidities include basal cell skin cancer (4 spots removed last 2 years ago - followed by Dr. Evorn Gong), plantar fasciitis, L wrist pain, RTC repair, chronic low back pain.    Examination-Activity Limitations Bed Mobility;Bend;Lift;Hygiene/Grooming;Squat;Caring for Others;Sit;Sleep;Transfers;Carry    Examination-Participation Restrictions Cleaning;Yard Work   bending, lifting, amount of activities, sleeping, prolonged sitting, watching TV/relaxing, standing up from sitting, gardening, cleaning, dressing.   Stability/Clinical Decision Making Stable/Uncomplicated    Rehab Potential Good    PT Frequency 2x / week    PT Duration 12 weeks    PT  Treatment/Interventions Patient/family education;Electrical Stimulation;Cryotherapy;Moist Heat;Neuromuscular re-education;Therapeutic exercise;Manual techniques;ADLs/Self Care Home Management;Therapeutic activities;Dry needling;Passive range of motion;Joint Manipulations;Spinal Manipulations    PT Next Visit Plan assess response to HEP and update as appropriate    PT Home Exercise Plan Medbridge Access Code: [email or web address]    Consulted and Agree with Plan of Care Patient           Patient will benefit from skilled therapeutic intervention in order to improve the following deficits and impairments:  Pain, Decreased mobility, Increased muscle spasms, Postural  dysfunction, Decreased activity tolerance, Decreased endurance, Decreased range of motion, Decreased strength, Hypomobility, Impaired perceived functional ability  Visit Diagnosis: Left-sided low back pain with bilateral sciatica, unspecified chronicity     Problem List Patient Active Problem List   Diagnosis Date Noted  . Gluteal pain 10/13/2020  . Post-viral cough syndrome 03/10/2019  . Mild hyperlipidemia 05/04/2018  . Estrogen deficiency 04/30/2018  . Pre-operative examination 02/21/2017  . Low back pain 06/11/2016  . Osteopenia 09/30/2013  . Encounter for routine gynecological examination 01/18/2012  . Routine general medical examination at a health care facility 01/09/2012  . [place], [place] 02/06/2010  . DEGENERATIVE JOINT DISEASE, MILD 07/19/2009    Everlean Alstrom. Graylon Good, PT, DPT 10/31/20, 8:13 PM  [facility] [address] [place], [address] Phone: [phone]   Fax:  [phone]  Name: Kamren Heskett MRN: [id number] Date of Birth: [birth year]

## 2020-11-02 ENCOUNTER — Ambulatory Visit: Payer: 59 | Admitting: Physical Therapy

## 2020-11-02 ENCOUNTER — Encounter: Payer: Self-pay | Admitting: Physical Therapy

## 2020-11-02 ENCOUNTER — Other Ambulatory Visit: Payer: Self-pay

## 2020-11-02 DIAGNOSIS — M5441 Lumbago with sciatica, right side: Secondary | ICD-10-CM

## 2020-11-02 DIAGNOSIS — M5442 Lumbago with sciatica, left side: Secondary | ICD-10-CM

## 2020-11-02 NOTE — Therapy (Signed)
[facility] [address] [place], [address] Phone: [phone]   Fax:  [phone]  Physical Therapy Treatment  Patient Details  Name: Bridget [name] MRN: [id number] Date of Birth: 09-07-1956 Referring Provider (PT): Roque Lias A. Tower, MD    Encounter Date: 11/02/2020   PT End of Session - 11/02/20 1123    Visit Number 5    Number of Visits 24    Date for PT Re-Evaluation 01/12/21    Authorization Type [place] [employer] reporting period from 10/20/2020    Progress Note Due on Visit 10    PT Start Time 1120    PT Stop Time 1200    PT Time Calculation (min) 40 min    Equipment Utilized During Treatment --    Activity Tolerance Patient tolerated treatment well    Behavior During Therapy WFL for tasks assessed/performed           Past Medical History:  Diagnosis Date  . 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 Cancer (HCC)    BASAL CELL  . Chronic back pain   . GERD (gastroesophageal reflux disease)    OCC    Past Surgical History:  Procedure Laterality Date  . ANKLE SURGERY Right   . BASAL CELL CARCINOMA EXCISION  12/06/2017   forehead  . BREAST SURGERY  01/25/2018   Breast biopsy  . CESAREAN SECTION  [phone]   hodp pre term labor CS  . OVARIAN CYST SURGERY  1973  . SHOULDER ARTHROSCOPY Right 10/31/2017   Procedure: ARTHROSCOPY SHOULDER WITH LYSIS OF ADHESION, SUBACROMINAL DECOMPRESSION,DISTAL CLAVICLE EXCISION.;  Surgeon: Thornton Park, MD;  Location: [facility];  Service: Orthopedics;  Laterality: Right;  SHOULDER ARTHROSCOPY WITH LYSIS AND RESECTION OF ADHESIONS  . SHOULDER ARTHROSCOPY WITH OPEN ROTATOR CUFF REPAIR AND DISTAL CLAVICLE ACROMINECTOMY Right 03/19/2017   Procedure: SHOULDER ARTHROSCOPY WITH OPEN ROTATOR CUFF REPAIR AND SUBACROMINAL DECOMPRESSION;  Surgeon: Thornton Park, MD;  Location: [facility];  Service: Orthopedics;  Laterality: Right;  . TUBAL LIGATION  1999  .  WRIST GANGLION EXCISION      There were no vitals filed for this visit.   Subjective Assessment - 11/02/20 1121    Subjective Patient reports her pain 3-4/10 in the right glute and ner the left glut med. Feels it a tiny bit at the lateral left lower leg. Was not good this morning but she went to pickleball and worked it out some. Felt good on monday, not as good on tuesday, and today is [place].    Pertinent History Patient is a 64 y.o. female who presents to outpatient physical therapy with a referral for medical diagnosis gluteal pain. This patient's chief complaints consist of acute episode of low back pain radiating the the left glute and lateral leg to the knee in the setting of chronic low back paind R glute pain leading to the following functional deficits: difficulty with usual activities including bending, lifting, amount of activities, sleeping, prolonged sitting, watching TV/relaxing, standing up from sitting, gardening, cleaning, dressing..Relevant past medical history and comorbidities include basal cell skin cancer (4 spots removed last 2 years ago - followed by Dr. Evorn Gong), plantar fasciitis, L wrist pain, RTC repair, chronic low back pain.  Patient denies hx of stroke, seizures, lung problem, major cardiac events, diabetes, unexplained weight loss, changes in bowel or bladder problems, new onset stumbling.    Limitations Sitting;Lifting;House hold activities;Other (comment)   Functional Limitations: bending, lifting, amount  of activities, sleeping, prolonged sitting, watching TV/relaxing, standing up from sitting, gardening, cleaning, dressing.   Diagnostic tests none recent    Patient Stated Goals to not have the pain    Currently in Pain? Yes    Pain Score 4              TREATMENT: Patient has osteopenia  Therapeutic exercise:to centralize symptoms and improve ROM, strength, muscular endurance, and activity tolerance required for successful completion of functional  activities. - MDT lumbar extension in prone lying, 1x10  -Prone hip extension with knee flexed to preferentially bias gluteus maximus.2x10 each side, 1x10 R side only. Further reps attempted at left side to assess effect of manual therapy.  - low trunk rotation before and between manual rotation stretch to assess effect - supine to sit and rolling between manual interventions to assess affect.  - supine abdominal brace with marching and reverse crunch to attempt to decrease pain and to assess affect of manual therapy.  - education on using rotation of LEs to the right from supine as part of HEP.   Manual therapy:to reduce pain and tissue tension, improve range of motion, neuromodulation, in order to promote improved ability to complete functional activities. - MDT lumbar extension in prone lying with clinician overpressure near L5, 1x6, 1x10 (pain on the right side more centralized but more painful). 1x6 (no improvement).  - palpation and STM to tender region at R upper glutes and lumbar paraspinals, quite tender and no improvement in pain. Does feel tight with fasciculations as in trigger point release in some regions but without pain relief following.  - MDT flexion rotation with knees towards the right of body - no improvement - sidelying rotational gapping stretch and mobilization grades II-IV with left lumbar rotation x three sets with mild improvement in pain especially 1st and 2nd set to decrease pain at right low back to tolerable but still present level.   Pt required multimodal cuing for proper technique and to facilitate improved neuromuscular control, strength, range of motion, and functional ability resulting in improved performance and form.  HOME EXERCISE PROGRAM Access Code: [email or web address] URL: [email or web address] Date: 10/26/2020 Prepared by: Rosita Kea  Exercises Seated Correct Posture Prone Press Up - 6 x daily - 10-15 reps - 1 second hold Standing  Lumbar Extension - 6 x daily - 1 sets - 10-15 reps - 1 second hold Standing Lumbar Extension with Counter - 6 x daily - 1 sets - 10-15 reps - 1 second hold Prone Hip Extension with Bent Knee - 1 x daily - 2 sets - 10 reps - 1-2 seconds hold    PT Education - 11/02/20 1123    Education Details Exercise purpose/form. Self management techniques    Person(s) Educated Patient    Methods Explanation;Demonstration;Tactile cues;Verbal cues    Comprehension Verbalized understanding;Returned demonstration;Verbal cues required;Tactile cues required;Need further instruction            PT Short Term Goals - 10/26/20 1459      PT SHORT TERM GOAL #1   Title Be independent with initial home exercise program for self-management of symptoms.    Baseline initial HEP provided at IE (10/20/2020);    Time 2    Period Weeks    Status Achieved    Target Date 11/03/20             PT Long Term Goals - 10/20/20 1934      PT LONG TERM GOAL #1   Title Be  independent with a long-term home exercise program for self-management of symptoms.    Baseline Initial HEP provided at IE (1028/2021);    Time 12    Period Weeks    Status New   TARGET DATE FOR ALL LONG TERM GOALS: 01/12/2021     PT LONG TERM GOAL #2   Title Have full lumbar spine AROM with no compensations or increase in pain in all planes except intermittent end range discomfort to allow patient to complete valued activities with less difficulty.    Baseline painful - see objective exam (10/20/2020);    Time 12    Period Weeks    Status New      PT LONG TERM GOAL #3   Title Demonstrate improved FOTO score by 10 units to demonstrate improvement in overall condition and self-reported functional ability.    Baseline to be obtained by visit 2 as appropriate (10/20/2020);    Time 12    Period Weeks    Status New      PT LONG TERM GOAL #4   Title Patient will demonstrate sit <> stand and rolling without increase in pain to improve ability to  complete basic mobility (10/20/2020);    Baseline painful and cautions (10/20/2020);    Time 12    Period Weeks    Status New      PT LONG TERM GOAL #5   Title Complete community, work and/or recreational activities without limitation due to current condition.    Baseline Functional Limitations: bending, lifting, amount of activities, sleeping, prolonged sitting, watching TV/relaxing, standing up from sitting, gardening, cleaning, dressing (10/20/2020);    Time 12    Period Weeks    Status New                 Plan - 11/02/20 1946    Clinical Impression Statement Patient more bothered by right-sided gluteal pain today upon arrival.  Patient requested to complete MDT lumbar extension in prone lying with manual overpressure.  Noted for cavitation in the joints of the low back during this procedure.  Following this procedure patient noted movement of right-sided pain from gluteal region to base of lumbar spine on the right but much more irritable to the point where she cannot complete the third set of prone hip extensions using the left leg.  This limitation was due to pain.  She also had difficulty with rolling and going supine to sit and sit to stand due to pain in this region.  Palpation revealed tender and tight soft tissue over the lower right lumbar paraspinals and upper glutes with no relief with soft tissue mobilization to this region.  Patient noted for comfortable left lumbar rotation with knees towards the right from hook lying position so a lumbar gapping and MDT flexion rotation procedure was utilized resulting in improved pain at the right lumbar spine.  Pain was not resolved but significantly better with previously painful functional activity.  Patient reported continued resolution or decrease in pain at the original's site in the right glutes and no worsening of left-sided pain.  Patient advised to use the rotational movement at home as tolerated and as needed for pain control.   Patient's pain pattern continues to change visit to visit and she does demonstrate some characteristics suggesting she would benefit from lumbopelvic stabilization exercises.  Discussed this with patient and plan to implement at next session.Patient would benefit from continued management of limiting condition by skilled physical therapist to address remaining impairments and  functional limitations to work towards stated goals and return to PLOF or maximal functional independence.    Personal Factors and Comorbidities Age;Comorbidity 3+;Past/Current Experience;Time since onset of injury/illness/exacerbation    Comorbidities Relevant past medical history and comorbidities include basal cell skin cancer (4 spots removed last 2 years ago - followed by Dr. Evorn Gong), plantar fasciitis, L wrist pain, RTC repair, chronic low back pain.    Examination-Activity Limitations Bed Mobility;Bend;Lift;Hygiene/Grooming;Squat;Caring for Others;Sit;Sleep;Transfers;Carry    Examination-Participation Restrictions Cleaning;Yard Work   bending, lifting, amount of activities, sleeping, prolonged sitting, watching TV/relaxing, standing up from sitting, gardening, cleaning, dressing.   Stability/Clinical Decision Making Stable/Uncomplicated    Rehab Potential Good    PT Frequency 2x / week    PT Duration 12 weeks    PT Treatment/Interventions Patient/family education;Electrical Stimulation;Cryotherapy;Moist Heat;Neuromuscular re-education;Therapeutic exercise;Manual techniques;ADLs/Self Care Home Management;Therapeutic activities;Dry needling;Passive range of motion;Joint Manipulations;Spinal Manipulations    PT Next Visit Plan initiate lumbar stabilzation program    PT Home Exercise Plan Medbridge Access Code: [email or web address]    Consulted and Agree with Plan of Care Patient           Patient will benefit from skilled therapeutic intervention in order to improve the following deficits and impairments:  Pain, Decreased mobility,  Increased muscle spasms, Postural dysfunction, Decreased activity tolerance, Decreased endurance, Decreased range of motion, Decreased strength, Hypomobility, Impaired perceived functional ability  Visit Diagnosis: Left-sided low back pain with bilateral sciatica, unspecified chronicity     Problem List Patient Active Problem List   Diagnosis Date Noted  . Gluteal pain 10/13/2020  . Post-viral cough syndrome 03/10/2019  . Mild hyperlipidemia 05/04/2018  . Estrogen deficiency 04/30/2018  . Pre-operative examination 02/21/2017  . Low back pain 06/11/2016  . Osteopenia 09/30/2013  . Encounter for routine gynecological examination 01/18/2012  . Routine general medical examination at a health care facility 01/09/2012  . [place], [place] 02/06/2010  . DEGENERATIVE JOINT DISEASE, MILD 07/19/2009   Everlean Alstrom. Graylon Good, PT, DPT 11/02/20, 7:46 PM  [facility] [address], [place], [address] Phone: [phone]   Fax:  [phone]  Name: Riley Papin MRN: [id number] Date of Birth: 11-Apr-1956

## 2020-11-08 ENCOUNTER — Encounter: Payer: 59 | Admitting: Physical Therapy

## 2020-11-10 ENCOUNTER — Other Ambulatory Visit: Payer: Self-pay

## 2020-11-10 ENCOUNTER — Ambulatory Visit: Payer: 59 | Admitting: Physical Therapy

## 2020-11-10 ENCOUNTER — Encounter: Payer: Self-pay | Admitting: Physical Therapy

## 2020-11-10 DIAGNOSIS — M5442 Lumbago with sciatica, left side: Secondary | ICD-10-CM

## 2020-11-10 DIAGNOSIS — M5441 Lumbago with sciatica, right side: Secondary | ICD-10-CM

## 2020-11-10 NOTE — Therapy (Signed)
[facility] [address] [place], [address] Phone: [phone]   Fax:  [phone]  Physical Therapy Treatment / Progress Note Reporting period: 10/20/2020 - 11/10/2020  Patient Details  Name: Bridget [name] MRN: [id number] Date of Birth: [birth year] Referring Provider (PT): Roque Lias A. Tower, MD    Encounter Date: 11/10/2020   PT End of Session - 11/10/20 1923    Visit Number 6    Number of Visits 24    Date for PT Re-Evaluation 01/12/21    Authorization Type [facility] [employer] reporting period from 10/20/2020    Progress Note Due on Visit 10    PT Start Time 1730    PT Stop Time 1815    PT Time Calculation (min) 45 min    Activity Tolerance Patient tolerated treatment well    Behavior During Therapy [facility] for tasks assessed/performed           Past Medical History:  Diagnosis Date  . 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 Cancer (HCC)    BASAL CELL  . Chronic back pain   . GERD (gastroesophageal reflux disease)    OCC    Past Surgical History:  Procedure Laterality Date  . ANKLE SURGERY Right   . BASAL CELL CARCINOMA EXCISION  12/06/2017   forehead  . BREAST SURGERY  01/25/2018   Breast biopsy  . CESAREAN SECTION  [phone]   hodp pre term labor CS  . OVARIAN CYST SURGERY  1973  . SHOULDER ARTHROSCOPY Right 10/31/2017   Procedure: ARTHROSCOPY SHOULDER WITH LYSIS OF ADHESION, SUBACROMINAL DECOMPRESSION,DISTAL CLAVICLE EXCISION.;  Surgeon: Thornton Park, MD;  Location: [facility] ORS;  Service: Orthopedics;  Laterality: Right;  SHOULDER ARTHROSCOPY WITH LYSIS AND RESECTION OF ADHESIONS  . SHOULDER ARTHROSCOPY WITH OPEN ROTATOR CUFF REPAIR AND DISTAL CLAVICLE ACROMINECTOMY Right 03/19/2017   Procedure: SHOULDER ARTHROSCOPY WITH OPEN ROTATOR CUFF REPAIR AND SUBACROMINAL DECOMPRESSION;  Surgeon: Thornton Park, MD;  Location: [facility] ORS;  Service: Orthopedics;  Laterality: Right;  . TUBAL  LIGATION  1999  . WRIST GANGLION EXCISION      There were no vitals filed for this visit.   Subjective Assessment - 11/10/20 1733    Subjective Pateint reports her left leg pain has gotten better but her right side that started last week is pretty bothersome and doesn't seem to be getting better. She feels it it when she performs standing lumbar extension with right lean but not straight back. Reports pain on left is ~ 2/10 upon arrival. Pain on right is up to 7-8/10 when she moves into it but less than that just standing. She has not played tennis or pickle ball this week. She hasn't done a whole lot this week except travel which has not made it worse. She is having a bit of trouble recalling how she felt following last treatment session but was able to sleep. she is thinking about discontinuing PT for a while after today because she feels like it is not helping much currently and she has hit a plateu.    Pertinent History Patient is a 64 y.o. female who presents to outpatient physical therapy with a referral for medical diagnosis gluteal pain. This patient's chief complaints consist of acute episode of low back pain radiating the the left glute and lateral leg to the knee in the setting of chronic low back paind R glute pain leading to the following functional deficits: difficulty with usual activities including  bending, lifting, amount of activities, sleeping, prolonged sitting, watching TV/relaxing, standing up from sitting, gardening, cleaning, dressing..Relevant past medical history and comorbidities include basal cell skin cancer (4 spots removed last 2 years ago - followed by Dr. Dasher), plantar fasciitis, L wrist pain, RTC repair, chronic low back pain.  Patient denies hx of stroke, seizures, lung problem, major cardiac events, diabetes, unexplained weight loss, changes in bowel or bladder problems, new onset stumbling.    Limitations Sitting;Lifting;House hold activities;Other (comment)    Functional Limitations: bending, lifting, amount of activities, sleeping, prolonged sitting, watching TV/relaxing, standing up from sitting, gardening, cleaning, dressing.   Diagnostic tests none recent    Patient Stated Goals to not have the pain    Currently in Pain? Yes    Pain Score 7     Pain Location Back          OBJECTIVE FOTO = 72 (11/10/2020);    TREATMENT: Patient has osteopenia  Therapeutic exercise:to centralize symptoms and improve ROM, strength, muscular endurance, and activity tolerance required for successful completion of functional activities. - hooklying low trunk rotation, x10 (more painful with legs to the left, but painful both ways, discontinued due to increasing pain).  Prone hip extension with knee flexed to preferentially bias gluteus maximus.2x10 each side (one set before and after dry needling).  - discussion of progress, POC, goals, education on options for continuing or pausing PT care.   Manual therapy: to reduce pain and tissue tension, improve range of motion, neuromodulation, in order to promote improved ability to complete functional activities. - Supine long axis distraction through each hip 3x 20 seconds each side using mobilization belt.  - Prone STM to R lumbar paraspinals, QL, and superior fibers of glute max.   Modality: (unbilled) Dry needling performed to R lumbar paraspinals at approximately L5 to decrease pain and spasms along patient's low back and glute region with patient in prone utilizing (1) dry needle(s) .3mm x 75mm. Patient educated about the risks and benefits from therapy and verbally consents to treatment. Dry needling performed by Sara R. Snyder, PT, DPT who is certified in this technique.  Pt required multimodal cuing for proper technique and to facilitate improved neuromuscular control, strength, range of motion, and functional ability resulting in improved performance and form.  HOME EXERCISE PROGRAM Access Code:  [email or web address] URL: [email or web address] Date: 10/26/2020 Prepared by: Sara Snyder  Exercises Seated Correct Posture Prone Press Up - 6 x daily - 10-15 reps - 1 second hold Standing Lumbar Extension - 6 x daily - 1 sets - 10-15 reps - 1 second hold Standing Lumbar Extension with Counter - 6 x daily - 1 sets - 10-15 reps - 1 second hold Prone Hip Extension with Bent Knee - 1 x daily - 2 sets - 10 reps - 1-2 seconds hold    PT Education - 11/10/20 1735    Education Details Exercise purpose/form. Self management techniques    Person(s) Educated Patient    Methods Explanation;Demonstration;Tactile cues;Verbal cues    Comprehension Verbalized understanding;Returned demonstration;Verbal cues required;Tactile cues required;Need further instruction            PT Short Term Goals - 10/26/20 1459      PT SHORT TERM GOAL #1   Title Be independent with initial home exercise program for self-management of symptoms.    Baseline initial HEP provided at IE (10/20/2020);    Time 2    Period Weeks    Status Achieved    Target Date 11/03/20               PT Long Term Goals - 11/10/20 1929      PT LONG TERM GOAL #1   Title Be independent with a long-term home exercise program for self-management of symptoms.    Baseline Initial HEP provided at IE (1028/2021);    Time 12    Period Weeks    Status Partially Met   TARGET DATE FOR ALL LONG TERM GOALS: 01/12/2021     PT LONG TERM GOAL #2   Title Have full lumbar spine AROM with no compensations or increase in pain in all planes except intermittent end range discomfort to allow patient to complete valued activities with less difficulty.    Baseline painful - see objective exam (10/20/2020);    Time 12    Period Weeks    Status On-going      PT LONG TERM GOAL #3   Title Demonstrate improved FOTO score by 10 units to demonstrate improvement in overall condition and self-reported functional ability.    Baseline to be obtained by  visit 2 as appropriate (10/20/2020); 65 (10/24/2020); 72 (11/10/2020);    Time 12    Period Weeks    Status Partially Met      PT LONG TERM GOAL #4   Title Patient will demonstrate sit <> stand and rolling without increase in pain to improve ability to complete basic mobility (10/20/2020);    Baseline painful and cautions (10/20/2020);    Time 12    Period Weeks    Status On-going      PT LONG TERM GOAL #5   Title Complete community, work and/or recreational activities without limitation due to current condition.    Baseline Functional Limitations: bending, lifting, amount of activities, sleeping, prolonged sitting, watching TV/relaxing, standing up from sitting, gardening, cleaning, dressing (10/20/2020);    Time 12    Period Weeks    Status On-going                 Plan - 11/10/20 1933    Clinical Impression Statement Patient tolerated treatment well overall with no lasting increase in pain but did not get good relief from any interventions today. She has attended 6 physical therapy sessions and has had improved left sided pain and is able to sleep through the night but right sided pain has returned and is now bothering her without improving. After discussion of her options for continuing, pausing, or discontinuing PT, patient elected to put PT on hold for the next few weeks and work on self-management. Will contact PT for more visits if she would like to continue in the future. Patient would benefit from continued management of limiting condition by skilled physical therapist to address remaining impairments and functional limitations to work towards stated goals and return to PLOF or maximal functional independence.    Personal Factors and Comorbidities Age;Comorbidity 3+;Past/Current Experience;Time since onset of injury/illness/exacerbation    Comorbidities Relevant past medical history and comorbidities include basal cell skin cancer (4 spots removed last 2 years ago - followed  by Dr. Evorn Gong), plantar fasciitis, L wrist pain, RTC repair, chronic low back pain.    Examination-Activity Limitations Bed Mobility;Bend;Lift;Hygiene/Grooming;Squat;Caring for Others;Sit;Sleep;Transfers;Carry    Examination-Participation Restrictions Cleaning;Yard Work   bending, lifting, amount of activities, sleeping, prolonged sitting, watching TV/relaxing, standing up from sitting, gardening, cleaning, dressing.   Stability/Clinical Decision Making Stable/Uncomplicated    Rehab Potential Good    PT Frequency 2x / week    PT Duration 12 weeks    PT Treatment/Interventions Patient/family education;Electrical Stimulation;Cryotherapy;Moist Heat;Neuromuscular  re-education;Therapeutic exercise;Manual techniques;ADLs/Self Care Home Management;Therapeutic activities;Dry needling;Passive range of motion;Joint Manipulations;Spinal Manipulations    PT Next Visit Plan hold PT at this time per patient preference. If patient returns consider initiating lumbar stabilzation program    PT Home Exercise Plan Medbridge Access Code: [email or web address]    Consulted and Agree with Plan of Care Patient           Patient will benefit from skilled therapeutic intervention in order to improve the following deficits and impairments:  Pain, Decreased mobility, Increased muscle spasms, Postural dysfunction, Decreased activity tolerance, Decreased endurance, Decreased range of motion, Decreased strength, Hypomobility, Impaired perceived functional ability  Visit Diagnosis: Left-sided low back pain with bilateral sciatica, unspecified chronicity     Problem List Patient Active Problem List   Diagnosis Date Noted  . Gluteal pain 10/13/2020  . Post-viral cough syndrome 03/10/2019  . Mild hyperlipidemia 05/04/2018  . Estrogen deficiency 04/30/2018  . Pre-operative examination 02/21/2017  . Low back pain 06/11/2016  . Osteopenia 09/30/2013  . Encounter for routine gynecological examination 01/18/2012  . Routine general  medical examination at a health care facility 01/09/2012  . [place], [place] 02/06/2010  . DEGENERATIVE JOINT DISEASE, MILD 07/19/2009    Everlean Alstrom. Graylon Good, PT, DPT 11/10/20, 7:34 PM  [facility] [address] [place], [address] Phone: [phone]   Fax:  [phone]  Name: Bridget [name] MRN: [id number] Date of Birth: 10-21-1956

## 2020-11-14 ENCOUNTER — Ambulatory Visit: Payer: 59 | Admitting: Physical Therapy

## 2020-11-15 ENCOUNTER — Encounter: Payer: 59 | Admitting: Physical Therapy

## 2020-11-22 ENCOUNTER — Encounter: Payer: 59 | Admitting: Physical Therapy

## 2020-11-23 ENCOUNTER — Encounter: Payer: 59 | Admitting: Physical Therapy

## 2020-11-24 ENCOUNTER — Encounter: Payer: 59 | Admitting: Physical Therapy

## 2020-11-28 ENCOUNTER — Encounter: Payer: 59 | Admitting: Physical Therapy

## 2020-11-30 ENCOUNTER — Encounter: Payer: 59 | Admitting: Physical Therapy

## 2020-12-01 ENCOUNTER — Ambulatory Visit: Payer: 59

## 2020-12-01 ENCOUNTER — Other Ambulatory Visit: Payer: Self-pay

## 2020-12-01 ENCOUNTER — Ambulatory Visit
Admission: RE | Admit: 2020-12-01 | Discharge: 2020-12-01 | Disposition: A | Discharge status: Home / Self Care | Payer: 59 | Source: Ambulatory Visit | Attending: Family Medicine | Admitting: Family Medicine

## 2020-12-01 DIAGNOSIS — Z1231 Encounter for screening mammogram for malignant neoplasm of breast: Secondary | ICD-10-CM | POA: Diagnosis not present

## 2020-12-05 ENCOUNTER — Encounter: Payer: 59 | Admitting: Physical Therapy

## 2020-12-07 ENCOUNTER — Encounter: Payer: 59 | Admitting: Physical Therapy

## 2020-12-12 ENCOUNTER — Encounter: Payer: 59 | Admitting: Physical Therapy

## 2020-12-14 ENCOUNTER — Encounter: Payer: 59 | Admitting: Physical Therapy

## 2020-12-20 ENCOUNTER — Encounter: Payer: 59 | Admitting: Physical Therapy

## 2020-12-22 ENCOUNTER — Encounter: Payer: 59 | Admitting: Physical Therapy

## 2021-04-27 ENCOUNTER — Telehealth: Payer: Self-pay | Admitting: Family Medicine

## 2021-04-27 DIAGNOSIS — Z1211 Encounter for screening for malignant neoplasm of colon: Secondary | ICD-10-CM

## 2021-04-27 NOTE — Telephone Encounter (Signed)
Pt called in she stated that she is overdue to a colonoscopy, and wanted to know if she can get it done @[facility] .

## 2021-04-28 DIAGNOSIS — Z1211 Encounter for screening for malignant neoplasm of colon: Secondary | ICD-10-CM | POA: Insufficient documentation

## 2021-04-28 NOTE — Addendum Note (Signed)
Addended by: Loura Pardon A on: 04/28/2021 04:36 PM   Modules accepted: Orders

## 2021-05-19 DIAGNOSIS — H5213 Myopia, bilateral: Secondary | ICD-10-CM | POA: Diagnosis not present

## 2021-05-22 ENCOUNTER — Telehealth: Payer: Self-pay | Admitting: Family Medicine

## 2021-05-22 DIAGNOSIS — E785 Hyperlipidemia, unspecified: Secondary | ICD-10-CM

## 2021-05-22 DIAGNOSIS — Z0000 Encounter for general adult medical examination without abnormal findings: Secondary | ICD-10-CM

## 2021-05-22 NOTE — Telephone Encounter (Signed)
-----   Message from Cloyd Stagers, RT sent at 05/11/2021 10:16 AM EDT ----- Regarding: Lab Orders for Tuesday 5.31.2022 Please place lab orders for Tuesday 5.31.2022, office visit for physical on Wednesday 6.8.2022 Thank you, Dyke Maes RT(R)

## 2021-05-23 ENCOUNTER — Other Ambulatory Visit: Payer: Self-pay

## 2021-05-23 ENCOUNTER — Other Ambulatory Visit (INDEPENDENT_AMBULATORY_CARE_PROVIDER_SITE_OTHER): Payer: 59

## 2021-05-23 DIAGNOSIS — Z0000 Encounter for general adult medical examination without abnormal findings: Secondary | ICD-10-CM

## 2021-05-23 DIAGNOSIS — E785 Hyperlipidemia, unspecified: Secondary | ICD-10-CM | POA: Diagnosis not present

## 2021-05-23 LAB — LIPID PANEL
Cholesterol: 207 mg/dL — ABNORMAL HIGH (ref 0–200)
HDL: 54.5 mg/dL (ref >39.00)
LDL Cholesterol: 136 mg/dL — ABNORMAL HIGH (ref 0–99)
NonHDL: 152.56
Total CHOL/HDL Ratio: 4
Triglycerides: 85 mg/dL (ref 0.0–149.0)
VLDL: 17 mg/dL (ref 0.0–40.0)

## 2021-05-23 LAB — CBC WITH DIFFERENTIAL/PLATELET
Basophils Absolute: 0 10*3/uL (ref 0.0–0.1)
Basophils Relative: 0.8 % (ref 0.0–3.0)
Eosinophils Absolute: 0.2 10*3/uL (ref 0.0–0.7)
Eosinophils Relative: 4 % (ref 0.0–5.0)
HCT: 40.6 % (ref 36.0–46.0)
Hemoglobin: 13.4 g/dL (ref 12.0–15.0)
Lymphocytes Relative: 26.1 % (ref 12.0–46.0)
Lymphs Abs: 1.4 10*3/uL (ref 0.7–4.0)
MCHC: 33.1 g/dL (ref 30.0–36.0)
MCV: 87.3 fl (ref 78.0–100.0)
Monocytes Absolute: 0.4 10*3/uL (ref 0.1–1.0)
Monocytes Relative: 7.7 % (ref 3.0–12.0)
Neutro Abs: 3.3 10*3/uL (ref 1.4–7.7)
Neutrophils Relative %: 61.4 % (ref 43.0–77.0)
Platelets: 280 10*3/uL (ref 150.0–400.0)
RBC: 4.66 Mil/uL (ref 3.87–5.11)
RDW: 14.5 % (ref 11.5–15.5)
WBC: 5.3 10*3/uL (ref 4.0–10.5)

## 2021-05-23 LAB — COMPREHENSIVE METABOLIC PANEL
ALT: 11 U/L (ref 0–35)
AST: 15 U/L (ref 0–37)
Albumin: 4.1 g/dL (ref 3.5–5.2)
Alkaline Phosphatase: 64 U/L (ref 39–117)
BUN: 20 mg/dL (ref 6–23)
CO2: 28 mEq/L (ref 19–32)
Calcium: 9.1 mg/dL (ref 8.4–10.5)
Chloride: 105 mEq/L (ref 96–112)
Creatinine, Ser: 0.63 mg/dL (ref 0.40–1.20)
GFR: 93.51 mL/min (ref >60.00)
Glucose, Bld: 88 mg/dL (ref 70–99)
Potassium: 3.9 mEq/L (ref 3.5–5.1)
Sodium: 141 mEq/L (ref 135–145)
Total Bilirubin: 0.5 mg/dL (ref 0.2–1.2)
Total Protein: 6.3 g/dL (ref 6.0–8.3)

## 2021-05-23 LAB — TSH: TSH: 1.55 u[IU]/mL (ref 0.35–4.50)

## 2021-05-24 ENCOUNTER — Telehealth: Payer: Self-pay | Admitting: Family Medicine

## 2021-05-24 DIAGNOSIS — Z0000 Encounter for general adult medical examination without abnormal findings: Secondary | ICD-10-CM

## 2021-05-24 DIAGNOSIS — E785 Hyperlipidemia, unspecified: Secondary | ICD-10-CM

## 2021-05-24 NOTE — Telephone Encounter (Signed)
-----   Message from Cloyd Stagers, RT sent at 05/08/2021  2:19 PM EDT ----- Regarding: Lab Orders for Thursday 6.2.2022 Please place lab orders for Thursday 6.2.2022, office visit for physical on Wednesday 6.8.2022 Thank you, Dyke Maes RT(R)

## 2021-05-25 ENCOUNTER — Other Ambulatory Visit: Payer: 59

## 2021-05-31 ENCOUNTER — Encounter: Payer: Self-pay | Admitting: Family Medicine

## 2021-05-31 ENCOUNTER — Telehealth: Payer: Self-pay | Admitting: Family Medicine

## 2021-05-31 ENCOUNTER — Other Ambulatory Visit: Payer: Self-pay

## 2021-05-31 ENCOUNTER — Telehealth (INDEPENDENT_AMBULATORY_CARE_PROVIDER_SITE_OTHER): Payer: 59 | Admitting: Family Medicine

## 2021-05-31 DIAGNOSIS — J069 Acute upper respiratory infection, unspecified: Secondary | ICD-10-CM

## 2021-05-31 MED ORDER — CARESTART COVID-19 HOME TEST VI KIT
PACK | 0 refills | Status: DC
  Filled 2021-05-31: qty 4, 4d supply, fill #0

## 2021-05-31 NOTE — Telephone Encounter (Signed)
Patient call in stated she has a cough and sore throat . Stated she tested neg. For Covid . Please advise

## 2021-05-31 NOTE — Assessment & Plan Note (Signed)
Pt was exp to grandchild with cough  Neg home covid test (pcr pending from Monday)  Disc symptom control and f/u parameters Zyrtec, tylenol, delsym prn  Salt water gargle Fluids/rest Will call with result  Update if new or worsening symptoms

## 2021-05-31 NOTE — Progress Notes (Signed)
Virtual Visit via Video Note  I connected with Bridget [name] on 05/31/21 at 10:00 AM EDT by a video enabled telemedicine application and verified that I am speaking with the correct person using two identifiers.  Location: Patient: home Provider: office   I discussed the limitations of evaluation and management by telemedicine and the availability of in person appointments. The patient expressed understanding and agreed to proceed.  Parties involved in encounter  Patient: Bridget [name]  Provider:  Loura Pardon MD   History of Present Illness: Pt presents with cough and ST  Saturday afternoon (keeping grandkids and one had pink eye the week before and then resp symptoms- negative for covid)  Cough sat Is dry cough (worse at night and on awakening) No wheeze or tight chest  No runny or stuffy nose  Eyes are fine  Then ST  No fever that she knows of    No loss of taste or smell  No GI symptoms    Neg home covid test  Had test through work on [place] pending    Otc: tylenol if headache  Tessalon pearles-took one last night (helped a little bit)   covid immunized   Patient Active Problem List   Diagnosis Date Noted  . Colon cancer screening 04/28/2021  . Gluteal pain 10/13/2020  . Mild hyperlipidemia 05/04/2018  . Estrogen deficiency 04/30/2018  . Pre-operative examination 02/21/2017  . Low back pain 06/11/2016  . Viral URI with cough 05/20/2015  . Osteopenia 09/30/2013  . Encounter for routine gynecological examination 01/18/2012  . Routine general medical examination at a health care facility 01/09/2012  . [place], [place] 02/06/2010  . DEGENERATIVE JOINT DISEASE, MILD 07/19/2009   Past Medical History:  Diagnosis Date  . 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 Cancer (HCC)    BASAL CELL  . Chronic back pain   . GERD (gastroesophageal reflux disease)    OCC   Past Surgical History:  Procedure Laterality Date  . ANKLE SURGERY Right    . BASAL CELL CARCINOMA EXCISION  12/06/2017   forehead  . BREAST BIOPSY Right 02/05/2018  . BREAST SURGERY  01/25/2018   Breast biopsy  . CESAREAN SECTION  [phone]   hodp pre term labor CS  . OVARIAN CYST SURGERY  1973  . SHOULDER ARTHROSCOPY Right 10/31/2017   Procedure: ARTHROSCOPY SHOULDER WITH LYSIS OF ADHESION, SUBACROMINAL DECOMPRESSION,DISTAL CLAVICLE EXCISION.;  Surgeon: Thornton Park, MD;  Location: [facility] ORS;  Service: Orthopedics;  Laterality: Right;  SHOULDER ARTHROSCOPY WITH LYSIS AND RESECTION OF ADHESIONS  . SHOULDER ARTHROSCOPY WITH OPEN ROTATOR CUFF REPAIR AND DISTAL CLAVICLE ACROMINECTOMY Right 03/19/2017   Procedure: SHOULDER ARTHROSCOPY WITH OPEN ROTATOR CUFF REPAIR AND SUBACROMINAL DECOMPRESSION;  Surgeon: Thornton Park, MD;  Location: [facility] ORS;  Service: Orthopedics;  Laterality: Right;  . TUBAL LIGATION  1999  . WRIST GANGLION EXCISION     Social History   Tobacco Use  . Smoking status: Never Smoker  . Smokeless tobacco: Never Used  Vaping Use  . Vaping Use: Never used  Substance Use Topics  . Alcohol use: Yes    Alcohol/week: 4.0 standard drinks    Types: 4 Glasses of wine per week    Comment: 2-3 nights per week  . Drug use: No   Family History  Problem Relation Age of Onset  . Arthritis Mother        OA with verterabral fx  . Hypertension Mother   . Fibrocystic breast disease Mother   .  Cancer Maternal Grandfather        colon CA  . Diabetes Paternal Grandmother    Allergies  Allergen Reactions  . Lorcet [Hydrocodone-Acetaminophen] Rash   Current Outpatient Medications on File Prior to Visit  Medication Sig Dispense Refill  . Cholecalciferol 25 MCG (1000 UT) capsule Take 2,000 Units by mouth daily.     Mariane Baumgarten Calcium (STOOL SOFTENER PO) Take 2 tablets by mouth daily.    Marland Kitchen ibuprofen (ADVIL,MOTRIN) 200 MG tablet Take 400 mg by mouth 2 (two) times daily as needed for mild pain.     . meloxicam (MOBIC) 7.5 MG tablet Take 1 tablet  by mouth daily as needed.  1  . Polyethyl Glycol-Propyl Glycol (SYSTANE OP) Apply 2 drops to eye as needed (dry eyes).    . Probiotic Product (PROBIOTIC ADVANCED PO) Take by mouth as needed.     No current facility-administered medications on file prior to visit.   Review of Systems  Constitutional: Negative for chills, fever and malaise/fatigue.  HENT: Positive for sore throat. Negative for congestion, ear pain and sinus pain.   Eyes: Negative for blurred vision, discharge and redness.  Respiratory: Positive for cough. Negative for sputum production, shortness of breath, wheezing and stridor.   Cardiovascular: Negative for chest pain, palpitations and leg swelling.  Gastrointestinal: Negative for abdominal pain, diarrhea, nausea and vomiting.  Musculoskeletal: Negative for myalgias.  Skin: Negative for rash.  Neurological: Positive for headaches. Negative for dizziness.    Observations/Objective: Patient appears well, in no distress Weight is baseline  No facial swelling or asymmetry Normal voice-not hoarse and no slurred speech No obvious tremor or mobility impairment Moving neck and UEs normally Able to hear the call well  occ cough and throat clearing noted No audible sob or wheeze Talkative and mentally sharp with no cognitive changes No skin changes on face or neck , no rash or pallor Affect is normal    Assessment and Plan: Problem List Items Addressed This Visit      Respiratory   Viral URI with cough    Pt was exp to grandchild with cough  Neg home covid test (pcr pending from Monday)  Disc symptom control and f/u parameters Zyrtec, tylenol, delsym prn  Salt water gargle Fluids/rest Will call with result  Update if new or worsening symptoms          Follow Up Instructions: Continue to isolate until you get test results (let us know results also)  Try zyrtec for cough or runny nose if you develop that  Tylenol for sore throat or headache Salt water gargle  for throat  Delsym over the counter (generic is ok) for cough   Update if not starting to improve in a week or if worsening    Call and re schedule your physical when you feel better   I discussed the assessment and treatment plan with the patient. The patient was provided an opportunity to ask questions and all were answered. The patient agreed with the plan and demonstrated an understanding of the instructions.   The patient was advised to call back or seek an in-person evaluation if the symptoms worsen or if the condition fails to improve as anticipated.     Loura Pardon, MD

## 2021-05-31 NOTE — Patient Instructions (Signed)
Continue to isolate until you get test results (let us know results also)  Try zyrtec for cough or runny nose if you develop that  Tylenol for sore throat or headache Salt water gargle for throat  Delsym over the counter (generic is ok) for cough   Update if not starting to improve in a week or if worsening    Call and re schedule your physical when you feel better

## 2021-05-31 NOTE — Telephone Encounter (Signed)
appt changed to virtual to discuss sxs

## 2021-06-06 ENCOUNTER — Other Ambulatory Visit: Payer: Self-pay

## 2021-06-16 ENCOUNTER — Other Ambulatory Visit: Payer: Self-pay

## 2021-06-16 ENCOUNTER — Telehealth (INDEPENDENT_AMBULATORY_CARE_PROVIDER_SITE_OTHER): Payer: 59 | Admitting: Gastroenterology

## 2021-06-16 DIAGNOSIS — Z1211 Encounter for screening for malignant neoplasm of colon: Secondary | ICD-10-CM

## 2021-06-16 MED ORDER — NA SULFATE-K SULFATE-MG SULF 17.5-3.13-1.6 GM/177ML PO SOLN
1.0000 | Freq: Once | ORAL | 0 refills | Status: AC
  Filled 2021-06-16: qty 354, 1d supply, fill #0
  Filled 2021-06-28: qty 354, 2d supply, fill #0

## 2021-06-16 NOTE — Progress Notes (Signed)
Patient requested to switch her procedure date. Endo unit has been notified.

## 2021-06-16 NOTE — Progress Notes (Signed)
Gastroenterology Pre-Procedure Review  Request Date: 07/06/21 Requesting Physician: Dr. Vicente Males  PATIENT REVIEW QUESTIONS: The patient responded to the following health history questions as indicated:    1. Are you having any GI issues? no 2. Do you have a personal history of Polyps?  10/08/210 No Polyps removed. 3. Do you have a family history of Colon Cancer or Polyps? yes (M. Grandfather colon cancer) 4. Diabetes Mellitus? no 5. Joint replacements in the past 12 months?no 6. Major health problems in the past 3 months?no 7. Any artificial heart valves, MVP, or defibrillator?no    MEDICATIONS & ALLERGIES:    Patient reports the following regarding taking any anticoagulation/antiplatelet therapy:   Plavix, Coumadin, Eliquis, Xarelto, Lovenox, Pradaxa, Brilinta, or Effient? no Aspirin? no  Patient confirms/reports the following medications:  Current Outpatient Medications  Medication Sig Dispense Refill   Cholecalciferol 25 MCG (1000 UT) capsule Take 2,000 Units by mouth daily.      COVID-19 At Home Antigen Test [name] COVID-19 HOME TEST) KIT Use as directed 4 each 0   Docusate Calcium (STOOL SOFTENER PO) Take 2 tablets by mouth daily.     ibuprofen (ADVIL,MOTRIN) 200 MG tablet Take 400 mg by mouth 2 (two) times daily as needed for mild pain.      meloxicam (MOBIC) 7.5 MG tablet Take 1 tablet by mouth daily as needed.  1   Polyethyl Glycol-Propyl Glycol (SYSTANE OP) Apply 2 drops to eye as needed (dry eyes).     Probiotic Product (PROBIOTIC ADVANCED PO) Take by mouth as needed.     No current facility-administered medications for this visit.    Patient confirms/reports the following allergies:  Allergies  Allergen Reactions   Lorcet [Hydrocodone-Acetaminophen] Rash    No orders of the defined types were placed in this encounter.   AUTHORIZATION INFORMATION Primary Insurance: 1D#: Group #:  Secondary Insurance: 1D#: Group #:  SCHEDULE INFORMATION: Date:  07/06/21 Time: Location: [place]

## 2021-06-28 ENCOUNTER — Other Ambulatory Visit: Payer: Self-pay

## 2021-07-04 ENCOUNTER — Ambulatory Visit: Payer: 59 | Admitting: Registered Nurse

## 2021-07-04 ENCOUNTER — Encounter: Payer: Self-pay | Admitting: Gastroenterology

## 2021-07-04 ENCOUNTER — Ambulatory Visit
Admission: RE | Admit: 2021-07-04 | Discharge: 2021-07-04 | Disposition: A | Discharge status: Home / Self Care | Payer: 59 | Attending: Gastroenterology | Admitting: Gastroenterology

## 2021-07-04 ENCOUNTER — Encounter
Admission: RE | Disposition: A | Discharge status: Home / Self Care | Payer: Self-pay | Source: Home / Self Care | Attending: Gastroenterology

## 2021-07-04 DIAGNOSIS — Z1211 Encounter for screening for malignant neoplasm of colon: Secondary | ICD-10-CM

## 2021-07-04 SURGERY — COLONOSCOPY WITH PROPOFOL
Anesthesia: General

## 2021-07-04 MED ORDER — PROPOFOL 500 MG/50ML IV EMUL
INTRAVENOUS | Status: AC
  Filled 2021-07-04: qty 50

## 2021-07-04 MED ORDER — SODIUM CHLORIDE 0.9 % IV SOLN: INTRAVENOUS | Status: DC

## 2021-07-04 NOTE — Anesthesia Preprocedure Evaluation (Signed)
Anesthesia Evaluation  Patient identified by MRN, date of birth, ID band Patient awake    Reviewed: Allergy & Precautions, NPO status , Patient's Chart, lab work & pertinent test results  History of Anesthesia Complications Negative for: history of anesthetic complications  Airway Mallampati: III  TM Distance: <3 FB Neck ROM: full    Dental  (+) Chipped   Pulmonary neg pulmonary ROS, neg shortness of breath,    Pulmonary exam normal        Cardiovascular Exercise Tolerance: Good (-) angina(-) Past MI and (-) DOE negative cardio ROS Normal cardiovascular exam     Neuro/Psych negative neurological ROS  negative psych ROS   GI/Hepatic Neg liver ROS, GERD  Medicated and Controlled,  Endo/Other  negative endocrine ROS  Renal/GU negative Renal ROS  negative genitourinary   Musculoskeletal  (+) Arthritis ,   Abdominal   Peds  Hematology negative hematology ROS (+)   Anesthesia Other Findings Past Medical History: No date: Arthritis     Comment:  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 No date: Cancer (HCC)     Comment:  BASAL CELL No date: Chronic back pain No date: GERD (gastroesophageal reflux disease)     Comment:  OCC  Past Surgical History: No date: ANKLE SURGERY; Right 12/06/2017: BASAL CELL CARCINOMA EXCISION     Comment:  forehead 02/05/2018: BREAST BIOPSY; Right 01/25/2018: BREAST SURGERY     Comment:  Breast biopsy [phone]: CESAREAN SECTION     Comment:  hodp pre term labor CS 1973: OVARIAN CYST SURGERY 10/31/2017: SHOULDER ARTHROSCOPY; Right     Comment:  Procedure: ARTHROSCOPY SHOULDER WITH LYSIS OF ADHESION,               SUBACROMINAL DECOMPRESSION,DISTAL CLAVICLE EXCISION.;                Surgeon: Thornton Park, MD;  Location: [facility] ORS;                Service: Orthopedics;  Laterality: Right;  SHOULDER               ARTHROSCOPY WITH LYSIS AND RESECTION OF ADHESIONS 03/19/2017: SHOULDER ARTHROSCOPY WITH  OPEN ROTATOR CUFF REPAIR AND  DISTAL CLAVICLE ACROMINECTOMY; Right     Comment:  Procedure: SHOULDER ARTHROSCOPY WITH OPEN ROTATOR CUFF               REPAIR AND SUBACROMINAL DECOMPRESSION;  Surgeon: Thornton Park, MD;  Location: [facility];  Service:               Orthopedics;  Laterality: Right; 1999: TUBAL LIGATION No date: WRIST GANGLION EXCISION  BMI    Body Mass Index: 27.46 kg/m      Reproductive/Obstetrics negative OB ROS                             Anesthesia Physical Anesthesia Plan  ASA: 2  Anesthesia Plan: General   Post-op Pain Management:    Induction: Intravenous  PONV Risk Score and Plan: Propofol infusion and TIVA  Airway Management Planned: Natural Airway and Nasal Cannula  Additional Equipment:   Intra-op Plan:   Post-operative Plan:   Informed Consent: I have reviewed the patients History and Physical, chart, labs and discussed the procedure including the risks, benefits and alternatives for the proposed anesthesia with the patient or authorized representative who has indicated his/her understanding and acceptance.  Dental Advisory Given  Plan Discussed with: Anesthesiologist, CRNA and Surgeon  Anesthesia Plan Comments: (Patient consented for risks of anesthesia including but not limited to:  - adverse reactions to medications - risk of airway placement if required - damage to eyes, teeth, lips or other oral mucosa - nerve damage due to positioning  - sore throat or hoarseness - Damage to heart, brain, nerves, lungs, other parts of body or loss of life  Patient voiced understanding.)        Anesthesia Quick Evaluation

## 2021-07-05 ENCOUNTER — Ambulatory Visit
Admission: RE | Admit: 2021-07-05 | Discharge: 2021-07-05 | Disposition: A | Discharge status: Home / Self Care | Payer: 59 | Attending: Gastroenterology | Admitting: Gastroenterology

## 2021-07-05 ENCOUNTER — Ambulatory Visit: Payer: 59 | Admitting: Anesthesiology

## 2021-07-05 ENCOUNTER — Encounter
Admission: RE | Disposition: A | Discharge status: Home / Self Care | Payer: Self-pay | Source: Home / Self Care | Attending: Gastroenterology

## 2021-07-05 ENCOUNTER — Encounter: Payer: Self-pay | Admitting: Gastroenterology

## 2021-07-05 DIAGNOSIS — Z1211 Encounter for screening for malignant neoplasm of colon: Secondary | ICD-10-CM | POA: Insufficient documentation

## 2021-07-05 DIAGNOSIS — Z885 Allergy status to narcotic agent status: Secondary | ICD-10-CM | POA: Insufficient documentation

## 2021-07-05 DIAGNOSIS — Z79899 Other long term (current) drug therapy: Secondary | ICD-10-CM | POA: Diagnosis not present

## 2021-07-05 DIAGNOSIS — Z85828 Personal history of other malignant neoplasm of skin: Secondary | ICD-10-CM | POA: Diagnosis not present

## 2021-07-05 DIAGNOSIS — Q438 Other specified congenital malformations of intestine: Secondary | ICD-10-CM | POA: Insufficient documentation

## 2021-07-05 HISTORY — PX: COLONOSCOPY WITH PROPOFOL: SHX5780

## 2021-07-05 SURGERY — COLONOSCOPY WITH PROPOFOL
Anesthesia: General

## 2021-07-05 MED ORDER — SODIUM CHLORIDE 0.9 % IV SOLN: INTRAVENOUS | Status: DC

## 2021-07-05 MED ORDER — PROPOFOL 500 MG/50ML IV EMUL
INTRAVENOUS | Status: AC
  Filled 2021-07-05: qty 50

## 2021-07-05 MED ORDER — PROPOFOL 10 MG/ML IV BOLUS
INTRAVENOUS | Status: DC | PRN
  Administered 2021-07-05: 60 mg via INTRAVENOUS

## 2021-07-05 MED ORDER — GLYCOPYRROLATE 0.2 MG/ML IJ SOLN
INTRAMUSCULAR | Status: AC
  Filled 2021-07-05: qty 1

## 2021-07-05 MED ORDER — LIDOCAINE HCL (PF) 2 % IJ SOLN
INTRAMUSCULAR | Status: AC
  Filled 2021-07-05: qty 25

## 2021-07-05 MED ORDER — LIDOCAINE HCL (CARDIAC) PF 100 MG/5ML IV SOSY
PREFILLED_SYRINGE | INTRAVENOUS | Status: DC | PRN
  Administered 2021-07-05: 100 mg via INTRAVENOUS

## 2021-07-05 MED ORDER — PROPOFOL 500 MG/50ML IV EMUL
INTRAVENOUS | Status: DC | PRN
  Administered 2021-07-05: 140 ug/kg/min via INTRAVENOUS

## 2021-07-05 MED ORDER — PHENYLEPHRINE HCL (PRESSORS) 10 MG/ML IV SOLN
INTRAVENOUS | Status: DC | PRN
  Administered 2021-07-05: 50 ug via INTRAVENOUS
  Administered 2021-07-05 (×3): 100 ug via INTRAVENOUS

## 2021-07-05 MED ORDER — PROPOFOL 500 MG/50ML IV EMUL
INTRAVENOUS | Status: AC
  Filled 2021-07-05: qty 150

## 2021-07-05 NOTE — H&P (Signed)
Bridget Bellows, MD [address] [place], [place], [place], [address] [address], [place], [place], [place], [address] Phone: [phone]  Fax: [phone]  Primary Care Physician:  Tower, Wynelle Fanny, MD   Pre-Procedure History & Physical: HPI:  Bridget [name] is a 65 y.o. female is here for an colonoscopy.   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   Cancer ([place])    BASAL CELL   Chronic back pain    GERD (gastroesophageal reflux disease)    OCC    Past Surgical History:  Procedure Laterality Date   ANKLE SURGERY Right    BASAL CELL CARCINOMA EXCISION  12/06/2017   forehead   BREAST BIOPSY Right 02/05/2018   BREAST SURGERY  01/25/2018   Breast biopsy   CESAREAN SECTION  [phone]   hodp pre term labor CS   OVARIAN CYST SURGERY  1973   SHOULDER ARTHROSCOPY Right 10/31/2017   Procedure: ARTHROSCOPY SHOULDER WITH LYSIS OF ADHESION, SUBACROMINAL DECOMPRESSION,DISTAL CLAVICLE EXCISION.;  Surgeon: Thornton Park, MD;  Location: [facility];  Service: Orthopedics;  Laterality: Right;  SHOULDER ARTHROSCOPY WITH LYSIS AND RESECTION OF ADHESIONS   SHOULDER ARTHROSCOPY WITH OPEN ROTATOR CUFF REPAIR AND DISTAL CLAVICLE ACROMINECTOMY Right 03/19/2017   Procedure: SHOULDER ARTHROSCOPY WITH OPEN ROTATOR CUFF REPAIR AND SUBACROMINAL DECOMPRESSION;  Surgeon: Thornton Park, MD;  Location: [facility];  Service: Orthopedics;  Laterality: Right;   TUBAL LIGATION  1999   WRIST GANGLION EXCISION      Prior to Admission medications   Medication Sig Start Date End Date Taking? Authorizing Provider  Cholecalciferol 25 MCG (1000 UT) capsule Take 2,000 Units by mouth daily.     [provider]  COVID-19 At Home Antigen Test [facility] COVID-19 HOME TEST) KIT Use as directed 05/31/21   Letta Median, RPH  Docusate Calcium (STOOL SOFTENER PO) Take 2 tablets by mouth daily.    [provider]  ibuprofen (ADVIL,MOTRIN) 200 MG  tablet Take 400 mg by mouth 2 (two) times daily as needed for mild pain.     [provider]  meloxicam (MOBIC) 7.5 MG tablet Take 1 tablet by mouth daily as needed. 03/07/18   [provider]  Polyethyl Glycol-Propyl Glycol (SYSTANE OP) Apply 2 drops to eye as needed (dry eyes).    [provider]  Probiotic Product (PROBIOTIC ADVANCED PO) Take by mouth as needed.    [provider]    Allergies as of 07/04/2021 - Review Complete 07/04/2021  Allergen Reaction Noted   Lorcet [hydrocodone-acetaminophen] Rash 01/18/2012    Family History  Problem Relation Age of Onset   Arthritis Mother        OA with verterabral fx   Hypertension Mother    Fibrocystic breast disease Mother    Cancer Maternal Grandfather        colon CA   Diabetes Paternal Grandmother     Social History   Socioeconomic History   Marital status: Married    Spouse name: Not on file   Number of children: Not on file   Years of education: Not on file   Highest education level: Not on file  Occupational History   Not on file  Tobacco Use   Smoking status: Never   Smokeless tobacco: Never  Vaping Use   Vaping Use: Never used  Substance and Sexual Activity   Alcohol use: Yes    Alcohol/week: 4.0 standard drinks  Types: 4 Glasses of wine per week    Comment: 2-3 nights per week   Drug use: No   Sexual activity: Not on file  Other Topics Concern   Not on file  Social History Narrative   Not on file   Social Determinants of Health   Financial Resource Strain: Not on file  Food Insecurity: Not on file  Transportation Needs: Not on file  Physical Activity: Not on file  Stress: Not on file  Social Connections: Not on file  Intimate Partner Violence: Not on file    Review of Systems: See HPI, otherwise negative ROS  Physical Exam: BP 121/76   Pulse 76   Temp 98.4 F (36.9 C) (Temporal)   Resp 17   Ht _0  (1.6 m)   Wt 70.3 kg   SpO2 98%   BMI 27.46 kg/m   General:   Alert,  pleasant and cooperative in NAD Head:  Normocephalic and atraumatic. Neck:  Supple; no masses or thyromegaly. Lungs:  Clear throughout to auscultation, normal respiratory effort.    Heart:  +S1, +S2, Regular rate and rhythm, No edema. Abdomen:  Soft, nontender and nondistended. Normal bowel sounds, without guarding, and without rebound.   Neurologic:  Alert and  oriented x4;  grossly normal neurologically.  Impression/Plan: Bridget [name] is here for an colonoscopy to be performed for Screening colonoscopy average risk   Risks, benefits, limitations, and alternatives regarding  colonoscopy have been reviewed with the patient.  Questions have been answered.  All parties agreeable.   Bridget Bellows, MD  07/05/2021, 12:01 PM

## 2021-07-05 NOTE — Anesthesia Postprocedure Evaluation (Signed)
Anesthesia Post Note  Patient: Bridget [name]  Procedure(s) Performed: COLONOSCOPY WITH PROPOFOL  Patient location during evaluation: Phase II Anesthesia Type: General Level of consciousness: awake and alert, awake and oriented Pain management: pain level controlled Vital Signs Assessment: post-procedure vital signs reviewed and stable Respiratory status: spontaneous breathing, nonlabored ventilation and respiratory function stable Cardiovascular status: blood pressure returned to baseline and stable Postop Assessment: no apparent nausea or vomiting Anesthetic complications: no   No notable events documented.   Last Vitals:  Vitals:   07/05/21 1301 07/05/21 1311  BP: (!) 92/58 108/69  Pulse: 65 66  Resp: 14 15  Temp:    SpO2: 97% 99%    Last Pain:  Vitals:   07/05/21 1311  TempSrc:   PainSc: 0-No pain                 Phill Mutter

## 2021-07-05 NOTE — Op Note (Signed)
[facility] Gastroenterology Patient Name: Bridget [name] Procedure Date: 07/05/2021 10:23 AM MRN: [id number] Account #: [id number] Date of Birth: Sep 14, 1956 Admit Type: Outpatient Age: 65 Room: [facility] ENDO ROOM 1 Gender: Female Note Status: Finalized Procedure:             Colonoscopy Indications:           Screening for colorectal malignant neoplasm Providers:             Jonathon Bellows MD, MD Referring MD:          Wynelle Fanny. Tower (Referring MD) Medicines:             Monitored Anesthesia Care Complications:         No immediate complications. Procedure:             Pre-Anesthesia Assessment:                        - Prior to the procedure, a History and Physical was                         performed, and patient medications, allergies and                         sensitivities were reviewed. The patient's tolerance                         of previous anesthesia was reviewed.                        - The risks and benefits of the procedure and the                         sedation options and risks were discussed with the                         patient. All questions were answered and informed                         consent was obtained.                        - ASA Grade Assessment: II - A patient with mild                         systemic disease.                        After obtaining informed consent, the colonoscope was                         passed under direct vision. Throughout the procedure,                         the patient's blood pressure, pulse, and oxygen                         saturations were monitored continuously. The                         Colonoscope was introduced through the anus with  the                         intention of advancing to the cecum. The scope was                         advanced to the ascending colon before the procedure                         was aborted. Medications were given. The colonoscopy                          was technically difficult and complex due to a                         redundant colon. Successful completion of the                         procedure was aided by using manual pressure. The                         patient tolerated the procedure well. The quality of                         the bowel preparation was excellent. Findings:      The perianal and digital rectal examinations were normal.      The exam was otherwise without abnormality on direct and retroflexion       views. Impression:            - The examination was otherwise normal on direct and                         retroflexion views.                        - No specimens collected. Recommendation:        - Discharge patient to home (with escort).                        - Resume previous diet.                        - Continue present medications.                        - Perform a virtual colonoscopy in 4 weeks. Procedure Code(s):     --- Professional ---                        [phone], 40, Colonoscopy, flexible; diagnostic,                         including collection of specimen(s) by brushing or                         washing, when performed (separate procedure) Diagnosis Code(s):     --- Professional ---                        Z12.11, Encounter for screening for malignant neoplasm  of colon CPT copyright 2019 American Medical Association. All rights reserved. The codes documented in this report are preliminary and upon coder review may  be revised to meet current compliance requirements. Jonathon Bellows, MD Jonathon Bellows MD, MD 07/05/2021 12:42:26 PM This report has been signed electronically. Number of Addenda: 0 Note Initiated On: 07/05/2021 10:23 AM Scope Withdrawal Time: 0 hours 0 minutes 1 second  Total Procedure Duration: 0 hours 31 minutes 58 seconds  Estimated Blood Loss:  Estimated blood loss: none.      [facility]

## 2021-07-05 NOTE — Anesthesia Preprocedure Evaluation (Signed)
Anesthesia Evaluation  Patient identified by MRN, date of birth, ID band Patient awake    Reviewed: Allergy & Precautions, NPO status , Patient's Chart, lab work & pertinent test results  History of Anesthesia Complications Negative for: history of anesthetic complications  Airway Mallampati: III  TM Distance: <3 FB Neck ROM: full    Dental  (+) Chipped   Pulmonary neg pulmonary ROS, neg shortness of breath,    Pulmonary exam normal        Cardiovascular Exercise Tolerance: Good (-) angina(-) Past MI and (-) DOE negative cardio ROS Normal cardiovascular exam     Neuro/Psych negative neurological ROS  negative psych ROS   GI/Hepatic Neg liver ROS, GERD  Medicated and Controlled,  Endo/Other  negative endocrine ROS  Renal/GU negative Renal ROS  negative genitourinary   Musculoskeletal  (+) Arthritis ,   Abdominal   Peds  Hematology negative hematology ROS (+)   Anesthesia Other Findings Past Medical History: No date: Arthritis     Comment:  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 No date: Cancer (HCC)     Comment:  BASAL CELL No date: Chronic back pain No date: GERD (gastroesophageal reflux disease)     Comment:  OCC  Past Surgical History: No date: ANKLE SURGERY; Right 12/06/2017: BASAL CELL CARCINOMA EXCISION     Comment:  forehead 02/05/2018: BREAST BIOPSY; Right 01/25/2018: BREAST SURGERY     Comment:  Breast biopsy [phone]: CESAREAN SECTION     Comment:  hodp pre term labor CS 1973: OVARIAN CYST SURGERY 10/31/2017: SHOULDER ARTHROSCOPY; Right     Comment:  Procedure: ARTHROSCOPY SHOULDER WITH LYSIS OF ADHESION,               SUBACROMINAL DECOMPRESSION,DISTAL CLAVICLE EXCISION.;                Surgeon: Thornton Park, MD;  Location: [facility] ORS;                Service: Orthopedics;  Laterality: Right;  SHOULDER               ARTHROSCOPY WITH LYSIS AND RESECTION OF ADHESIONS 03/19/2017: SHOULDER ARTHROSCOPY WITH  OPEN ROTATOR CUFF REPAIR AND  DISTAL CLAVICLE ACROMINECTOMY; Right     Comment:  Procedure: SHOULDER ARTHROSCOPY WITH OPEN ROTATOR CUFF               REPAIR AND SUBACROMINAL DECOMPRESSION;  Surgeon: Thornton Park, MD;  Location: [facility] ORS;  Service:               Orthopedics;  Laterality: Right; 1999: TUBAL LIGATION No date: WRIST GANGLION EXCISION  BMI    Body Mass Index: 27.46 kg/m      Reproductive/Obstetrics negative OB ROS                             Anesthesia Physical  Anesthesia Plan  ASA: 2  Anesthesia Plan: General   Post-op Pain Management:    Induction: Intravenous  PONV Risk Score and Plan: Propofol infusion and TIVA  Airway Management Planned: Natural Airway and Nasal Cannula  Additional Equipment:   Intra-op Plan:   Post-operative Plan:   Informed Consent: I have reviewed the patients History and Physical, chart, labs and discussed the procedure including the risks, benefits and alternatives for the proposed anesthesia with the patient or authorized representative who has indicated his/her understanding and  acceptance.     Dental Advisory Given  Plan Discussed with: Anesthesiologist, CRNA and Surgeon  Anesthesia Plan Comments: (Patient consented for risks of anesthesia including but not limited to:  - adverse reactions to medications - risk of airway placement if required - damage to eyes, teeth, lips or other oral mucosa - nerve damage due to positioning  - sore throat or hoarseness - Damage to heart, brain, nerves, lungs, other parts of body or loss of life  Patient voiced understanding.)        Anesthesia Quick Evaluation

## 2021-07-05 NOTE — Anesthesia Procedure Notes (Signed)
Date/Time: 07/05/2021 12:07 PM Performed by: Lily Peer, Mariacristina Aday, CRNA Pre-anesthesia Checklist: Patient identified, Emergency Drugs available, Suction available, Patient being monitored and Timeout performed Patient Re-evaluated:Patient Re-evaluated prior to induction Oxygen Delivery Method: Nasal cannula Induction Type: IV induction

## 2021-07-05 NOTE — Transfer of Care (Signed)
Immediate Anesthesia Transfer of Care Note  Patient: Bridget [name]  Procedure(s) Performed: COLONOSCOPY WITH PROPOFOL  Patient Location: Endoscopy Unit  Anesthesia Type:General  Level of Consciousness: drowsy  Airway & Oxygen Therapy: Patient Spontanous Breathing  Post-op Assessment: Report given to RN and Post -op Vital signs reviewed and stable  Post vital signs: Reviewed and stable  Last Vitals:  Vitals Value Taken Time  BP 85/63 07/05/21 1243  Temp    Pulse 61 07/05/21 1243  Resp 14 07/05/21 1243  SpO2 98 % 07/05/21 1243  Vitals shown include unvalidated device data.  Last Pain:  Vitals:   07/05/21 1124  TempSrc: Temporal  PainSc: 0-No pain         Complications: No notable events documented.

## 2021-07-05 NOTE — H&P (Signed)
Jonathon Bellows, MD [address], [place], [place], [place], [address] [address], [place], [place], [place], [address] Phone: [phone]  Fax: [phone]  Primary Care Physician:  Tower, Wynelle Fanny, MD   Pre-Procedure History & Physical: HPI:  Bridget [name] is a 65 y.o. female is here for an colonoscopy.   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   Cancer ([place])    BASAL CELL   Chronic back pain    GERD (gastroesophageal reflux disease)    OCC    Past Surgical History:  Procedure Laterality Date   ANKLE SURGERY Right    BASAL CELL CARCINOMA EXCISION  12/06/2017   forehead   BREAST BIOPSY Right 02/05/2018   BREAST SURGERY  01/25/2018   Breast biopsy   CESAREAN SECTION  [phone]   hodp pre term labor CS   OVARIAN CYST SURGERY  1973   SHOULDER ARTHROSCOPY Right 10/31/2017   Procedure: ARTHROSCOPY SHOULDER WITH LYSIS OF ADHESION, SUBACROMINAL DECOMPRESSION,DISTAL CLAVICLE EXCISION.;  Surgeon: Thornton Park, MD;  Location: [facility] ORS;  Service: Orthopedics;  Laterality: Right;  SHOULDER ARTHROSCOPY WITH LYSIS AND RESECTION OF ADHESIONS   SHOULDER ARTHROSCOPY WITH OPEN ROTATOR CUFF REPAIR AND DISTAL CLAVICLE ACROMINECTOMY Right 03/19/2017   Procedure: SHOULDER ARTHROSCOPY WITH OPEN ROTATOR CUFF REPAIR AND SUBACROMINAL DECOMPRESSION;  Surgeon: Thornton Park, MD;  Location: [facility] ORS;  Service: Orthopedics;  Laterality: Right;   TUBAL LIGATION  1999   WRIST GANGLION EXCISION      Prior to Admission medications   Medication Sig Start Date End Date Taking? Authorizing Provider  Cholecalciferol 25 MCG (1000 UT) capsule Take 2,000 Units by mouth daily.    Yes [provider]  ibuprofen (ADVIL,MOTRIN) 200 MG tablet Take 400 mg by mouth 2 (two) times daily as needed for mild pain.    Yes [provider]  meloxicam (MOBIC) 7.5 MG tablet Take 1 tablet by mouth daily as needed. 03/07/18  Yes [provider]  Probiotic Product (PROBIOTIC ADVANCED PO) Take by mouth as needed.   Yes [provider]  COVID-19 At Home Antigen Test [facility] COVID-19 HOME TEST) KIT Use as directed 05/31/21   Letta Median, RPH  Docusate Calcium (STOOL SOFTENER PO) Take 2 tablets by mouth daily.    [provider]  Polyethyl Glycol-Propyl Glycol (SYSTANE OP) Apply 2 drops to eye as needed (dry eyes).    [provider]    Allergies as of 06/16/2021 - Review Complete 06/16/2021  Allergen Reaction Noted   Lorcet [hydrocodone-acetaminophen] Rash 01/18/2012    Family History  Problem Relation Age of Onset   Arthritis Mother        OA with verterabral fx   Hypertension Mother    Fibrocystic breast disease Mother    Cancer Maternal Grandfather        colon CA   Diabetes Paternal Grandmother     Social History   Socioeconomic History   Marital status: Married    Spouse name: Not on file   Number of children: Not on file   Years of education: Not on file   Highest education level: Not on file  Occupational History   Not on file  Tobacco Use   Smoking status: Never   Smokeless tobacco: Never  Vaping Use   Vaping Use: Never used  Substance and Sexual Activity   Alcohol use: Yes    Alcohol/week: 4.0 standard drinks  Types: 4 Glasses of wine per week    Comment: 2-3 nights per week   Drug use: No   Sexual activity: Not on file  Other Topics Concern   Not on file  Social History Narrative   Not on file   Social Determinants of Health   Financial Resource Strain: Not on file  Food Insecurity: Not on file  Transportation Needs: Not on file  Physical Activity: Not on file  Stress: Not on file  Social Connections: Not on file  Intimate Partner Violence: Not on file    Review of Systems: See HPI, otherwise negative ROS  Physical Exam: BP 131/61   Pulse 84   Temp (!) 97.5 F (36.4 C) (Temporal)   Resp 18   Ht _0  (1.6 m)   Wt 70.3 kg   SpO2  99%   BMI 27.46 kg/m  General:   Alert,  pleasant and cooperative in NAD Head:  Normocephalic and atraumatic. Neck:  Supple; no masses or thyromegaly. Lungs:  Clear throughout to auscultation, normal respiratory effort.    Heart:  +S1, +S2, Regular rate and rhythm, No edema. Abdomen:  Soft, nontender and nondistended. Normal bowel sounds, without guarding, and without rebound.   Neurologic:  Alert and  oriented x4;  grossly normal neurologically.  Impression/Plan: Bridget [name] is here for an colonoscopy to be performed for Screening colonoscopy average risk   Risks, benefits, limitations, and alternatives regarding  colonoscopy have been reviewed with the patient.  Questions have been answered.  All parties agreeable.   Jonathon Bellows, MD  07/05/2021, 2:10 PM

## 2021-07-06 ENCOUNTER — Encounter: Payer: Self-pay | Admitting: Gastroenterology

## 2021-07-20 ENCOUNTER — Ambulatory Visit (INDEPENDENT_AMBULATORY_CARE_PROVIDER_SITE_OTHER): Payer: 59

## 2021-07-20 ENCOUNTER — Other Ambulatory Visit: Payer: Self-pay

## 2021-07-20 ENCOUNTER — Telehealth: Payer: Self-pay

## 2021-07-20 ENCOUNTER — Ambulatory Visit
Admission: EM | Admit: 2021-07-20 | Discharge: 2021-07-20 | Disposition: A | Discharge status: Home / Self Care | Payer: 59 | Attending: Sports Medicine | Admitting: Sports Medicine

## 2021-07-20 DIAGNOSIS — R509 Fever, unspecified: Secondary | ICD-10-CM | POA: Insufficient documentation

## 2021-07-20 DIAGNOSIS — J069 Acute upper respiratory infection, unspecified: Secondary | ICD-10-CM | POA: Insufficient documentation

## 2021-07-20 DIAGNOSIS — R059 Cough, unspecified: Secondary | ICD-10-CM | POA: Diagnosis not present

## 2021-07-20 DIAGNOSIS — Z20822 Contact with and (suspected) exposure to covid-19: Secondary | ICD-10-CM | POA: Insufficient documentation

## 2021-07-20 DIAGNOSIS — Z885 Allergy status to narcotic agent status: Secondary | ICD-10-CM | POA: Diagnosis not present

## 2021-07-20 DIAGNOSIS — Z79899 Other long term (current) drug therapy: Secondary | ICD-10-CM | POA: Diagnosis not present

## 2021-07-20 LAB — RESP PANEL BY RT-PCR (FLU A&B, COVID) ARPGX2
Influenza A by PCR: NEGATIVE
Influenza B by PCR: NEGATIVE
SARS Coronavirus 2 by RT PCR: NEGATIVE

## 2021-07-20 MED ORDER — GUAIFENESIN-CODEINE 100-10 MG/5ML PO SYRP
ORAL_SOLUTION | ORAL | 0 refills | Status: DC
  Filled 2021-07-20: qty 210, 11d supply, fill #0

## 2021-07-20 MED ORDER — AZITHROMYCIN 250 MG PO TABS
ORAL_TABLET | ORAL | 0 refills | Status: DC
  Filled 2021-07-20: qty 6, 5d supply, fill #0

## 2021-07-20 NOTE — ED Triage Notes (Signed)
Patient states that she has been having fever, cough, nasal congestion, and body aches x Sunday. Patient states that she works for [employer] and did have a covid test with health at work on Monday and was negative.

## 2021-07-20 NOTE — Telephone Encounter (Signed)
I spoke with pt; pt said starting 07/16/21 pt had fever,dry cough and body aches. PCR on 07/17/21 neg. Now pt has prod cough with white phlegm; no SOB noted. Pt said she is sore in her stomach and upper back from coughing. Pt cannot tell if soreness in back is both sides of lungs or just one side.pt has chills with fever. Pt having nausea but has not vomited and starting on 07/19/21 having loose stools but not really diarrhea. Pt said this morning at 4 Am fever was 101.4; pt has been taking tylenol 500 mg taking 2 tabs. Pt is not sure if has fever now or not. Scratchy S/T and runny nose. Home covid test on 07/19/21 was neg. Pt does not have wheezing in her chest but does have "crackling sounds' in her nose., pt offered virtual appt today at [facility] but pt said due to how long symptoms have been going on and pt feels bad she will go to [facility] in [place] today. Sending note to DR Glori Bickers and [place] CMA as Juluis Rainier.

## 2021-07-20 NOTE — ED Provider Notes (Signed)
[facility]    CSN: [id number] Arrival date & time: 07/20/21  1037      History   Chief Complaint Chief Complaint  Patient presents with   Fever    HPI Bridget [name] is a 65 y.o. female who presents with onset of cough, nose congestion, body aches and fever of  101 4 days ago. Had negative Covid test through health at work 24 h later after onset of symptoms and was neg. Has never had Covid infection and has had 3 Covid injections. Has been taking Tylenol for fever which helps it bring it down and then brakes down in sweats. Has been taken some old rx of Tessalon perless and Robitusin Dm and has not helped her cough. Has bouts of cough attacks. Has been around her infant grand son who had URI and does not go to daycare. She came back from the [place] when she started her symptoms. 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   Cancer ([place])    BASAL CELL   Chronic back pain    GERD (gastroesophageal reflux disease)    OCC    Patient Active Problem List   Diagnosis Date Noted   Colon cancer screening 04/28/2021   Gluteal pain 10/13/2020   Mild hyperlipidemia 05/04/2018   Estrogen deficiency 04/30/2018   Pre-operative examination 02/21/2017   Low back pain 06/11/2016   Viral URI with cough 05/20/2015   Osteopenia 09/30/2013   Encounter for routine gynecological examination 01/18/2012   Routine general medical examination at a health care facility 01/09/2012   FASCIITIS, PLANTAR 02/06/2010   DEGENERATIVE JOINT DISEASE, MILD 07/19/2009    Past Surgical History:  Procedure Laterality Date   ANKLE SURGERY Right    BASAL CELL CARCINOMA EXCISION  12/06/2017   forehead   BREAST BIOPSY Right 02/05/2018   BREAST SURGERY  01/25/2018   Breast biopsy   CESAREAN SECTION  [id number]   hodp pre term labor CS   COLONOSCOPY WITH PROPOFOL N/A 07/05/2021    Procedure: COLONOSCOPY WITH PROPOFOL;  Surgeon: Jonathon Bellows, MD;  Location: [facility] END[facility]COPY;  Service: Gastroenterology;  Laterality: N/A;   OVARIAN CYST SURGERY  1973   SHOULDER ARTHROSCOPY Right 10/31/2017   Procedure: ARTHROSCOPY SHOULDER WITH LYSIS OF ADHESION, SUBACROMINAL DECOMPRESSION,DISTAL CLAVICLE EXCISION.;  Surgeon: Thornton Park, MD;  Location: [facility] ORS;  Service: Orthopedics;  Laterality: Right;  SHOULDER ARTHROSCOPY WITH LYSIS AND RESECTION OF ADHESIONS   SHOULDER ARTHROSCOPY WITH OPEN ROTATOR CUFF REPAIR AND DISTAL CLAVICLE ACROMINECTOMY Right 03/19/2017   Procedure: SHOULDER ARTHROSCOPY WITH OPEN ROTATOR CUFF REPAIR AND SUBACROMINAL DECOMPRESSION;  Surgeon: Thornton Park, MD;  Location: [facility] ORS;  Service: Orthopedics;  Laterality: Right;   TUBAL LIGATION  1999   WRIST GANGLION EXCISION      OB History   No obstetric history on file.      Home Medications    Prior to Admission medications   Medication Sig Start Date End Date Taking? Authorizing Provider  azithromycin (ZITHROMAX Z-PAK) 250 MG tablet Take 2 now, then one qd x 4 days 07/20/21  Yes Rodriguez-Southworth, Sandrea Matte  Cholecalciferol 25 MCG (1000 UT) capsule Take 2,000 Units by mouth daily.    Yes [provider]  COVID-19 At Home Antigen Test [facility] COVID-19 HOME TEST) KIT Use as directed 05/31/21  Yes Nicks, Ewing Schlein, RPH  Docusate Calcium (  STOOL SOFTENER PO) Take 2 tablets by mouth daily.   Yes [provider]  guaiFENesin-Codeine 200-10 MG/5ML LIQD 1 tsp q 6h prn cough 07/20/21  Yes Rodriguez-Southworth, Sunday Spillers, PA-C  ibuprofen (ADVIL,MOTRIN) 200 MG tablet Take 400 mg by mouth 2 (two) times daily as needed for mild pain.    Yes [provider]  meloxicam (MOBIC) 7.5 MG tablet Take 1 tablet by mouth daily as needed. 03/07/18  Yes [provider]  Polyethyl Glycol-Propyl Glycol (SYSTANE OP) Apply 2 drops to eye as needed (dry eyes).   Yes [provider]   Probiotic Product (PROBIOTIC ADVANCED PO) Take by mouth as needed.   Yes [provider]    Family History Family History  Problem Relation Age of Onset   Arthritis Mother        OA with verterabral fx   Hypertension Mother    Fibrocystic breast disease Mother    Cancer Maternal Grandfather        colon CA   Diabetes Paternal Grandmother     Social History Social History   Tobacco Use   Smoking status: Never   Smokeless tobacco: Never  Vaping Use   Vaping Use: Never used  Substance Use Topics   Alcohol use: Yes    Alcohol/week: 4.0 standard drinks    Types: 4 Glasses of wine per week    Comment: 2-3 nights per week   Drug use: No     Allergies   Lorcet [hydrocodone-acetaminophen]   Review of Systems  + rhinitis, cough, body aches, HA, loose stools after eating when her normal is being constipated, + fever. Denies urinary symptoms.   Physical Exam Triage Vital Signs ED Triage Vitals  Enc Vitals Group     BP 07/20/21 1047 (!) 100/52     Pulse Rate 07/20/21 1047 93     Resp 07/20/21 1047 18     Temp 07/20/21 1047 98.6 F (37 C)     Temp Source 07/20/21 1047 Oral     SpO2 07/20/21 1047 95 %     Weight 07/20/21 1046 153 lb (69.4 kg)     Height 07/20/21 1046 '5\' 3"'  (1.6 m)     Head Circumference --      Peak Flow --      Pain Score 07/20/21 1046 5     Pain Loc --      Pain Edu? --      Excl. in [place]? --    No data found.  Updated Vital Signs BP (!) 100/52 (BP Location: Right Arm)   Pulse 93   Temp 99.8 F (37.7 C) (Oral)   Resp 18   Ht '5\' 3"'  (1.6 m)   Wt 153 lb (69.4 kg)   SpO2 95%   BMI 27.10 kg/m   Visual Acuity Right Eye Distance:   Left Eye Distance:   Bilateral Distance:    Right Eye Near:   Left Eye Near:    Bilateral Near:       Physical Exam Vitals signs and nursing note reviewed.  Constitutional:      General: She is not in acute distress.    Appearance: Normal appearance. She is not ill-appearing, toxic-appearing or  diaphoretic.  HENT:     Head: Normocephalic.     Right Ear: Tympanic membrane, ear canal and external ear normal.     Left Ear: Tympanic membrane, ear canal and external ear normal.     Nose: Nose normal.  Mouth/Throat:     Mouth: Mucous membranes are moist.  Eyes:     General: No scleral icterus.       Right eye: No discharge.        Left eye: No discharge.     Conjunctiva/sclera: Conjunctivae normal.  Neck:     Musculoskeletal: Neck supple. No neck rigidity.  Cardiovascular:     Rate and Rhythm: Normal rate and regular rhythm.     Heart sounds: No murmur.  Pulmonary:     Effort: Pulmonary effort is normal.     Breath sounds: Normal breath sounds.  Abdominal:     General: Bowel sounds are normal. There is no distension.     Palpations: Abdomen is soft. There is no mass.     Tenderness: There is no abdominal tenderness. There is no guarding or rebound.     Hernia: No hernia is present.  Musculoskeletal: Normal range of motion.  Lymphadenopathy:     Cervical: No cervical adenopathy.  Skin:    General: Skin is warm and dry.     Coloration: Skin is not jaundiced.     Findings: No rash.  Neurological:     Mental Status: She is alert and oriented to person, place, and time.     Gait: Gait normal.  Psychiatric:        Mood and Affect: Mood normal.        Behavior: Behavior normal.        Thought Content: Thought content normal.        Judgment: Judgment normal.   UC Treatments / Results  Labs (all labs ordered are listed, but only abnormal results are displayed) Labs Reviewed  RESP PANEL BY RT-PCR (FLU A&B, COVID) [id number]  Flu tests and covid test are negative.   EKG   Radiology DG Chest 2 View  Result Date: 07/20/2021 CLINICAL DATA:  Cough, fever EXAM: CHEST - 2 VIEW COMPARISON:  None. FINDINGS: Left basilar scarring. Right lung clear. Heart is normal size. No effusions or acute bony abnormality. Gaseous distention of the colon at the splenic flexure, stable since  prior study. IMPRESSION: No active cardiopulmonary disease. Electronically Signed   By: Rolm Baptise M.D.   On: 07/20/2021 12:17    Procedures Procedures (including critical care time)  Medications Ordered in UC Medications - No data to display  Initial Impression / Assessment and Plan / UC Course  I have reviewed the triage vital signs and the nursing notes. Pertinent labs results that were available during my care of the patient were reviewed by me and considered in my medical decision making (see chart for details). I'm concerned she is getting bacterial bronchitis since she is running fevers and not getting better. I placed her on Zpack and Robitusin AC as noted.     Final Clinical Impressions(s) / UC Diagnoses   Final diagnoses:  Upper respiratory tract infection, unspecified type     Discharge Instructions      If you like you may take the following supplements to help your immune system be stronger to fight this viral infection  Zinc 50 mg ones a day x 7 days.  Zinc helps decrease the virus load in your body. Take Melatonin 6-10 mg at bed time which also helps support your immune system.  Also make sure to take Vit D 5,000 IU per day with a fatty meal and Vit C 5000 mg a day until you are completely better. To prevent viral illnesses your vitamin D  should be between 60-80. Stay on Vitamin D 2,000  and C  1000 mg the rest of the season.  Don't lay around, keep active and walk as much as you are able to to prevent worsening of your symptoms.  Follow up with your family Dr next week.  If you get short of breath and you are able to check  your oxygen with a pulse oxygen meter, if it gets to 92% or less, you need to go to the hospital to be admitted. If you dont have one, come back here and we will assess you.      ED Prescriptions     Medication Sig Dispense Auth. Provider   azithromycin (ZITHROMAX Z-PAK) 250 MG tablet Take 2 now, then one qd x 4 days 6 each  Rodriguez-Southworth, Melina Mosteller, PA-C   guaiFENesin-Codeine 200-10 MG/5ML LIQD 1 tsp q 6h prn cough 210 mL Rodriguez-Southworth, Sunday Spillers, PA-C      I have reviewed the PDMP during this encounter.   Shelby Mattocks, PA-C 07/20/21 1239

## 2021-07-20 NOTE — Discharge Instructions (Addendum)
If you like you may take the following supplements to help your immune system be stronger to fight this viral infection  Zinc 50 mg ones a day x 7 days.  Zinc helps decrease the virus load in your body. Take Melatonin 6-10 mg at bed time which also helps support your immune system.  Also make sure to take Vit D 5,000 IU per day with a fatty meal and Vit C 5000 mg a day until you are completely better. To prevent viral illnesses your vitamin D should be between 60-80. Stay on Vitamin D 2,000  and C  1000 mg the rest of the season.  Don't lay around, keep active and walk as much as you are able to to prevent worsening of your symptoms.  Follow up with your family Dr next week.  If you get short of breath and you are able to check  your oxygen with a pulse oxygen meter, if it gets to 92% or less, you need to go to the hospital to be admitted. If you dont have one, come back here and we will assess you.

## 2021-07-20 NOTE — Telephone Encounter (Signed)
Agree with that  Feel she needs eval in person  Will watch for correspondence thanks

## 2021-07-22 ENCOUNTER — Ambulatory Visit
Admission: EM | Admit: 2021-07-22 | Discharge: 2021-07-22 | Disposition: A | Discharge status: Home / Self Care | Payer: 59 | Attending: Emergency Medicine | Admitting: Emergency Medicine

## 2021-07-22 ENCOUNTER — Other Ambulatory Visit: Payer: Self-pay

## 2021-07-22 DIAGNOSIS — J069 Acute upper respiratory infection, unspecified: Secondary | ICD-10-CM

## 2021-07-22 MED ORDER — ALBUTEROL SULFATE HFA 108 (90 BASE) MCG/ACT IN AERS: 1.0000 | INHALATION_SPRAY | Freq: Four times a day (QID) | RESPIRATORY_TRACT | 0 refills | Status: DC | PRN

## 2021-07-22 NOTE — ED Triage Notes (Addendum)
Pt is here with a cough that started Sunday, pt states her fever is gone. Pt has taken the prescribed meds to relieve discomfort. Pt came on 07/20/2021 for the same issue, all lab test were NEGATIVE.

## 2021-07-22 NOTE — Discharge Instructions (Addendum)
Use the albuterol inhaler as directed.  Continue previously prescribed medications as directed.  Follow up with your primary care provider next week.

## 2021-07-22 NOTE — ED Provider Notes (Signed)
[facility]    CSN: [id number] Arrival date & time: 07/22/21  1006      History   Chief Complaint Chief Complaint  Patient presents with   Cough    HPI Bridget [name] is a 65 y.o. female.  Patient presents with 5-day history of nonproductive cough.  She had a fever earlier this week but states her fever resolved; no fever x 24 hours.  She denies shortness of breath, sore throat, rash, or other symptoms.  Patient was seen at [facility] urgent care on 07/20/2021; diagnosed with upper respiratory tract infection; treated with Zithromax and Robitussin-AC.  Her COVID and flu tests were negative.  The history is provided by the patient and medical records.   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   Cancer ([place])    BASAL CELL   Chronic back pain    GERD (gastroesophageal reflux disease)    OCC    Patient Active Problem List   Diagnosis Date Noted   Colon cancer screening 04/28/2021   Gluteal pain 10/13/2020   Mild hyperlipidemia 05/04/2018   Estrogen deficiency 04/30/2018   Pre-operative examination 02/21/2017   Low back pain 06/11/2016   Viral URI with cough 05/20/2015   Osteopenia 09/30/2013   Encounter for routine gynecological examination 01/18/2012   Routine general medical examination at a health care facility 01/09/2012   FASCIITIS, PLANTAR 02/06/2010   DEGENERATIVE JOINT DISEASE, MILD 07/19/2009    Past Surgical History:  Procedure Laterality Date   ANKLE SURGERY Right    BASAL CELL CARCINOMA EXCISION  12/06/2017   forehead   BREAST BIOPSY Right 02/05/2018   BREAST SURGERY  01/25/2018   Breast biopsy   CESAREAN SECTION  [phone]   hodp pre term labor CS   COLONOSCOPY WITH PROPOFOL N/A 07/05/2021   Procedure: COLONOSCOPY WITH PROPOFOL;  Surgeon: Jonathon Bellows, MD;  Location: [facility] ENDOSCOPY;  Service: Gastroenterology;  Laterality: N/A;   OVARIAN CYST SURGERY  1973   SHOULDER ARTHROSCOPY Right  10/31/2017   Procedure: ARTHROSCOPY SHOULDER WITH LYSIS OF ADHESION, SUBACROMINAL DECOMPRESSION,DISTAL CLAVICLE EXCISION.;  Surgeon: Thornton Park, MD;  Location: [facility] ORS;  Service: Orthopedics;  Laterality: Right;  SHOULDER ARTHROSCOPY WITH LYSIS AND RESECTION OF ADHESIONS   SHOULDER ARTHROSCOPY WITH OPEN ROTATOR CUFF REPAIR AND DISTAL CLAVICLE ACROMINECTOMY Right 03/19/2017   Procedure: SHOULDER ARTHROSCOPY WITH OPEN ROTATOR CUFF REPAIR AND SUBACROMINAL DECOMPRESSION;  Surgeon: Thornton Park, MD;  Location: [facility] ORS;  Service: Orthopedics;  Laterality: Right;   TUBAL LIGATION  1999   WRIST GANGLION EXCISION      OB History   No obstetric history on file.      Home Medications    Prior to Admission medications   Medication Sig Start Date End Date Taking? Authorizing Provider  albuterol (VENTOLIN HFA) 108 (90 Base) MCG/ACT inhaler Inhale 1-2 puffs into the lungs every 6 (six) hours as needed for wheezing or shortness of breath. 07/22/21  Yes Sharion Balloon, NP  azithromycin (ZITHROMAX Z-PAK) 250 MG tablet Take 2 now, then one qd x 4 days 07/20/21   Rodriguez-Southworth, Sunday Spillers, PA-C  Cholecalciferol 25 MCG (1000 UT) capsule Take 2,000 Units by mouth daily.     [provider]  COVID-19 At Home Antigen Test [facility] COVID-19 HOME TEST) KIT Use as directed 05/31/21   Letta Median, RPH  Docusate Calcium (STOOL SOFTENER PO) Take 2 tablets by mouth daily.    [provider]  guaiFENesin-codeine (  ROBITUSSIN AC) 100-10 MG/5ML syrup TAKE 5 ML BY MOUTH EVERY 6 HOURS AS NEEDED FOR COUGH 07/20/21   Rodriguez-Southworth, Sunday Spillers, PA-C  ibuprofen (ADVIL,MOTRIN) 200 MG tablet Take 400 mg by mouth 2 (two) times daily as needed for mild pain.     [provider]  meloxicam (MOBIC) 7.5 MG tablet Take 1 tablet by mouth daily as needed. 03/07/18   [provider]  Polyethyl Glycol-Propyl Glycol (SYSTANE OP) Apply 2 drops to eye as needed (dry eyes).    [provider]  Probiotic Product (PROBIOTIC ADVANCED PO) Take by mouth as needed.    [provider]    Family History Family History  Problem Relation Age of Onset   Arthritis Mother        OA with verterabral fx   Hypertension Mother    Fibrocystic breast disease Mother    Cancer Maternal Grandfather        colon CA   Diabetes Paternal Grandmother     Social History Social History   Tobacco Use   Smoking status: Never   Smokeless tobacco: Never  Vaping Use   Vaping Use: Never used  Substance Use Topics   Alcohol use: Yes    Alcohol/week: 4.0 standard drinks    Types: 4 Glasses of wine per week    Comment: 2-3 nights per week   Drug use: No     Allergies   Lorcet [hydrocodone-acetaminophen]   Review of Systems Review of Systems  Constitutional:  Negative for chills and fever.  HENT:  Negative for ear pain and sore throat.   Respiratory:  Positive for cough. Negative for shortness of breath.   Cardiovascular:  Negative for chest pain and palpitations.  Gastrointestinal:  Negative for abdominal pain, diarrhea and vomiting.  Skin:  Negative for color change and rash.  All other systems reviewed and are negative.   Physical Exam Triage Vital Signs ED Triage Vitals  Enc Vitals Group     BP 07/22/21 1027 114/60     Pulse Rate 07/22/21 1027 83     Resp 07/22/21 1027 17     Temp 07/22/21 1027 98.2 F (36.8 C)     Temp Source 07/22/21 1027 Oral     SpO2 07/22/21 1027 95 %     Weight --      Height --      Head Circumference --      Peak Flow --      Pain Score 07/22/21 1026 0     Pain Loc --      Pain Edu? --      Excl. in [place]? --    No data found.  Updated Vital Signs BP 114/60 (BP Location: Left Arm)   Pulse 83   Temp 98.2 F (36.8 C) (Oral)   Resp 17   SpO2 95%   Visual Acuity Right Eye Distance:   Left Eye Distance:   Bilateral Distance:    Right Eye Near:   Left Eye Near:    Bilateral Near:     Physical Exam Vitals and  nursing note reviewed.  Constitutional:      General: She is not in acute distress.    Appearance: She is well-developed. She is not ill-appearing.  HENT:     Head: Normocephalic and atraumatic.     Right Ear: Tympanic membrane normal.     Left Ear: Tympanic membrane normal.     Nose: Nose normal.     Mouth/Throat:  Mouth: Mucous membranes are moist.     Pharynx: Oropharynx is clear.  Eyes:     Conjunctiva/sclera: Conjunctivae normal.  Cardiovascular:     Rate and Rhythm: Normal rate and regular rhythm.     Heart sounds: Normal heart sounds.  Pulmonary:     Effort: Pulmonary effort is normal. No respiratory distress.     Breath sounds: Normal breath sounds. No wheezing, rhonchi or rales.  Abdominal:     Palpations: Abdomen is soft.     Tenderness: There is no abdominal tenderness.  Musculoskeletal:     Cervical back: Neck supple.  Skin:    General: Skin is warm and dry.  Neurological:     General: No focal deficit present.     Mental Status: She is alert and oriented to person, place, and time.     Gait: Gait normal.  Psychiatric:        Mood and Affect: Mood normal.        Behavior: Behavior normal.     UC Treatments / Results  Labs (all labs ordered are listed, but only abnormal results are displayed) Labs Reviewed - No data to display  EKG   Radiology DG Chest 2 View  Result Date: 07/20/2021 CLINICAL DATA:  Cough, fever EXAM: CHEST - 2 VIEW COMPARISON:  None. FINDINGS: Left basilar scarring. Right lung clear. Heart is normal size. No effusions or acute bony abnormality. Gaseous distention of the colon at the splenic flexure, stable since prior study. IMPRESSION: No active cardiopulmonary disease. Electronically Signed   By: Rolm Baptise M.D.   On: 07/20/2021 12:17    Procedures Procedures (including critical care time)  Medications Ordered in UC Medications - No data to display  Initial Impression / Assessment and Plan / UC Course  I have reviewed the  triage vital signs and the nursing notes.  Pertinent labs & imaging results that were available during my care of the patient were reviewed by me and considered in my medical decision making (see chart for details).   Viral URI with cough.  Patient is currently on Zithromax that was prescribed on 07/20/2021.  Her COVID and flu tests were negative and her chest x-ray was negative at that time.  Her lungs are clear and her O2 sat is 95% on room air.  No history of lung illness.  She does not smoke or vape.  At patient's request, adding albuterol inhaler.  Instructed her to continue other prescribed medications as previously directed.  Instructed her to follow-up with her PCP if her symptoms are not improving.  She agrees to plan of care.   Final Clinical Impressions(s) / UC Diagnoses   Final diagnoses:  Viral URI with cough     Discharge Instructions      Use the albuterol inhaler as directed.  Continue previously prescribed medications as directed.  Follow up with your primary care provider next week.          ED Prescriptions     Medication Sig Dispense Auth. Provider   albuterol (VENTOLIN HFA) 108 (90 Base) MCG/ACT inhaler Inhale 1-2 puffs into the lungs every 6 (six) hours as needed for wheezing or shortness of breath. 18 g Sharion Balloon, NP      PDMP not reviewed this encounter.   Sharion Balloon, NP 07/22/21 1100

## 2021-08-24 DIAGNOSIS — D2262 Melanocytic nevi of left upper limb, including shoulder: Secondary | ICD-10-CM | POA: Diagnosis not present

## 2021-08-24 DIAGNOSIS — U071 COVID-19: Secondary | ICD-10-CM

## 2021-08-24 DIAGNOSIS — L821 Other seborrheic keratosis: Secondary | ICD-10-CM | POA: Diagnosis not present

## 2021-08-24 DIAGNOSIS — D2272 Melanocytic nevi of left lower limb, including hip: Secondary | ICD-10-CM | POA: Diagnosis not present

## 2021-08-24 DIAGNOSIS — D485 Neoplasm of uncertain behavior of skin: Secondary | ICD-10-CM | POA: Diagnosis not present

## 2021-08-24 DIAGNOSIS — Z85828 Personal history of other malignant neoplasm of skin: Secondary | ICD-10-CM | POA: Diagnosis not present

## 2021-08-24 DIAGNOSIS — D2271 Melanocytic nevi of right lower limb, including hip: Secondary | ICD-10-CM | POA: Diagnosis not present

## 2021-08-24 HISTORY — DX: COVID-19: U07.1

## 2021-09-04 ENCOUNTER — Ambulatory Visit (INDEPENDENT_AMBULATORY_CARE_PROVIDER_SITE_OTHER): Payer: 59 | Admitting: Family Medicine

## 2021-09-04 ENCOUNTER — Encounter: Payer: Self-pay | Admitting: Family Medicine

## 2021-09-04 ENCOUNTER — Other Ambulatory Visit: Payer: Self-pay

## 2021-09-04 VITALS — BP 110/64 | HR 76 | Temp 97.5°F | Ht 63.0 in | Wt 155.0 lb

## 2021-09-04 DIAGNOSIS — R0683 Snoring: Secondary | ICD-10-CM | POA: Insufficient documentation

## 2021-09-04 DIAGNOSIS — Z0000 Encounter for general adult medical examination without abnormal findings: Secondary | ICD-10-CM | POA: Diagnosis not present

## 2021-09-04 DIAGNOSIS — M8589 Other specified disorders of bone density and structure, multiple sites: Secondary | ICD-10-CM

## 2021-09-04 DIAGNOSIS — E2839 Other primary ovarian failure: Secondary | ICD-10-CM

## 2021-09-04 DIAGNOSIS — Z23 Encounter for immunization: Secondary | ICD-10-CM | POA: Diagnosis not present

## 2021-09-04 DIAGNOSIS — Z1211 Encounter for screening for malignant neoplasm of colon: Secondary | ICD-10-CM

## 2021-09-04 DIAGNOSIS — E785 Hyperlipidemia, unspecified: Secondary | ICD-10-CM

## 2021-09-04 NOTE — Assessment & Plan Note (Signed)
Disc goals for lipids and reasons to control them Rev last labs with pt Rev low sat fat diet in detail LDL up slightly  Plans to continue working on diet

## 2021-09-04 NOTE — Assessment & Plan Note (Signed)
Colonoscopy 7/22-redundant colon Recommended virtual colonoscopy  Pt would like to hold off and discuss cologuard in a year

## 2021-09-04 NOTE — Assessment & Plan Note (Signed)
dexa ordered No falls or fractures  Taking vit D Good exercise   Disc need for calcium/ vitamin D/ wt bearing exercise and bone density test every 2 y to monitor Disc safety/ fracture risk in detail

## 2021-09-04 NOTE — Progress Notes (Signed)
Subjective:    Patient ID: Bridget [name], female    DOB: 1956-06-24, 65 y.o.   MRN: [id number] This visit occurred during the SARS-CoV-2 public health emergency.  Safety protocols were in place, including screening questions prior to the visit, additional usage of staff PPE, and extensive cleaning of exam room while observing appropriate contact time as indicated for disinfecting solutions.    HPI Here for health maintenance exam and to review chronic medical problems    Wt Readings from Last 3 Encounters:  09/04/21 155 lb (70.3 kg)  07/20/21 153 lb (69.4 kg)  07/05/21 155 lb (70.3 kg)   27.46 kg/m  Doing pretty good  Feeling her age    Covid immunized Tdap 5/17 Flu shot- will get at work  Pna vaccine- wants to do today Had shingrix series  Mammogram 12/21 Self breast exam- no lumps   Pap 8/20 nl with neg HPV  Colonoscopy 7/22  MGF had colon cancer  Had a redundant colon - did not entirely visualize  Wants to hold off on re imaging    Dexa 6/19 - osteopenia, goes to the breast center  Falls- none Fx-none Supplements -vit D  Exercise - tennis and pickle bal  Recent derm visit  H/o basal cell carcinoma in the past Uses sun protection   Depression screen [facility] 2/9 09/04/2021 08/05/2019 04/30/2018 04/27/2016  Decreased Interest 0 0 0 0  Down, Depressed, Hopeless 0 0 0 0  PHQ - 2 Score 0 0 0 0  Altered sleeping 0 1 - -  Tired, decreased energy 0 0 - -  Change in appetite 0 0 - -  Feeling bad or failure about yourself  0 0 - -  Trouble concentrating 0 0 - -  Moving slowly or fidgety/restless 0 0 - -  Suicidal thoughts 0 0 - -  PHQ-9 Score 0 1 - -  Difficult doing work/chores Not difficult at all - - -     BP Readings from Last 3 Encounters:  09/04/21 110/64  07/22/21 114/60  07/20/21 (!) 100/52   Pulse Readings from Last 3 Encounters:  09/04/21 76  07/22/21 83  07/20/21 93    Hyperlipidemia Lab Results  Component Value Date   CHOL 207 (H)  05/23/2021   CHOL 198 07/28/2019   CHOL 199 04/24/2018   Lab Results  Component Value Date   HDL 54.50 05/23/2021   HDL 53.80 07/28/2019   HDL 56.90 04/24/2018   Lab Results  Component Value Date   LDLCALC 136 (H) 05/23/2021   LDLCALC 126 (H) 07/28/2019   LDLCALC 129 (H) 04/24/2018   Lab Results  Component Value Date   TRIG 85.0 05/23/2021   TRIG 91.0 07/28/2019   TRIG 66.0 04/24/2018   Lab Results  Component Value Date   CHOLHDL 4 05/23/2021   CHOLHDL 4 07/28/2019   CHOLHDL 3 04/24/2018   No results found for: LDLDIRECT LDL is  Generally avoids fatty foods     Other labs from May Lab Results  Component Value Date   CREATININE 0.63 05/23/2021   BUN 20 05/23/2021   NA 141 05/23/2021   K 3.9 05/23/2021   CL 105 05/23/2021   CO2 28 05/23/2021    Lab Results  Component Value Date   ALT 11 05/23/2021   AST 15 05/23/2021   ALKPHOS 64 05/23/2021   BILITOT 0.5 05/23/2021   Lab Results  Component Value Date   TSH 1.55 05/23/2021    Lab Results  Component Value Date   WBC 5.3 05/23/2021   HGB 13.4 05/23/2021   HCT 40.6 05/23/2021   MCV 87.3 05/23/2021   PLT 280.0 05/23/2021   Going for an eye exam -Dr George Ina   She may have sleep apnea  Husband says she may have it  Very loud snoring (gasps at time)  Not tired   57 yo mother had a stroke  Also macular degeneration   Patient Active Problem List   Diagnosis Date Noted   Loud snoring 09/04/2021   Colon cancer screening 04/28/2021   Gluteal pain 10/13/2020   Mild hyperlipidemia 05/04/2018   Estrogen deficiency 04/30/2018   Pre-operative examination 02/21/2017   Low back pain 06/11/2016   Osteopenia 09/30/2013   Encounter for routine gynecological examination 01/18/2012   Routine general medical examination at a health care facility 01/09/2012   FASCIITIS, PLANTAR 02/06/2010   DEGENERATIVE JOINT DISEASE, MILD 07/19/2009   Past Medical History:  Diagnosis Date   Arthritis    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   Cancer ([place])    BASAL CELL   Chronic back pain    GERD (gastroesophageal reflux disease)    OCC   Past Surgical History:  Procedure Laterality Date   ANKLE SURGERY Right    BASAL CELL CARCINOMA EXCISION  12/06/2017   forehead   BREAST BIOPSY Right 02/05/2018   BREAST SURGERY  01/25/2018   Breast biopsy   CESAREAN SECTION  [id number]   hodp pre term labor CS   COLONOSCOPY WITH PROPOFOL N/A 07/05/2021   Procedure: COLONOSCOPY WITH PROPOFOL;  Surgeon: Jonathon Bellows, MD;  Location: [facility] ENDOSCOPY;  Service: Gastroenterology;  Laterality: N/A;   OVARIAN CYST SURGERY  1973   SHOULDER ARTHROSCOPY Right 10/31/2017   Procedure: ARTHROSCOPY SHOULDER WITH LYSIS OF ADHESION, SUBACROMINAL DECOMPRESSION,DISTAL CLAVICLE EXCISION.;  Surgeon: Thornton Park, MD;  Location: [facility] ORS;  Service: Orthopedics;  Laterality: Right;  SHOULDER ARTHROSCOPY WITH LYSIS AND RESECTION OF ADHESIONS   SHOULDER ARTHROSCOPY WITH OPEN ROTATOR CUFF REPAIR AND DISTAL CLAVICLE ACROMINECTOMY Right 03/19/2017   Procedure: SHOULDER ARTHROSCOPY WITH OPEN ROTATOR CUFF REPAIR AND SUBACROMINAL DECOMPRESSION;  Surgeon: Thornton Park, MD;  Location: [facility] ORS;  Service: Orthopedics;  Laterality: Right;   TUBAL LIGATION  1999   WRIST GANGLION EXCISION     Social History   Tobacco Use   Smoking status: Never   Smokeless tobacco: Never  Vaping Use   Vaping Use: Never used  Substance Use Topics   Alcohol use: Yes    Alcohol/week: 4.0 standard drinks    Types: 4 Glasses of wine per week    Comment: 2-3 nights per week   Drug use: No   Family History  Problem Relation Age of Onset   Arthritis Mother        OA with verterabral fx   Hypertension Mother    Fibrocystic breast disease Mother    Cancer Maternal Grandfather        colon CA   Diabetes Paternal Grandmother    Allergies  Allergen Reactions   Lorcet [Hydrocodone-Acetaminophen] Rash   Current Outpatient Medications on File Prior to Visit   Medication Sig Dispense Refill   Acetaminophen (TYLENOL 8 HOUR PO) Take by mouth.     Cholecalciferol 25 MCG (1000 UT) capsule Take 2,000 Units by mouth daily.      Docusate Calcium (STOOL SOFTENER PO) Take 2 tablets by mouth daily.     ibuprofen (ADVIL,MOTRIN) 200 MG tablet Take 400 mg by mouth 2 (two) times daily  as needed for mild pain.      meloxicam (MOBIC) 7.5 MG tablet Take 1 tablet by mouth daily as needed.  1   Multiple Vitamin (MULTIVITAMIN) capsule Take 1 capsule by mouth daily.     Polyethyl Glycol-Propyl Glycol (SYSTANE OP) Apply 2 drops to eye as needed (dry eyes).     Probiotic Product (PROBIOTIC ADVANCED PO) Take by mouth as needed.     No current facility-administered medications on file prior to visit.     Review of Systems  Constitutional:  Negative for activity change, appetite change, fatigue, fever and unexpected weight change.  HENT:  Negative for congestion, ear pain, rhinorrhea, sinus pressure and sore throat.   Eyes:  Negative for pain, redness and visual disturbance.  Respiratory:  Negative for cough, shortness of breath and wheezing.        Loud snoring Witnessed gasping No fatigue  Cardiovascular:  Negative for chest pain and palpitations.  Gastrointestinal:  Negative for abdominal pain, blood in stool, constipation and diarrhea.  Endocrine: Negative for polydipsia and polyuria.  Genitourinary:  Negative for dysuria, frequency and urgency.  Musculoskeletal:  Negative for arthralgias, back pain and myalgias.  Skin:  Negative for pallor and rash.  Allergic/Immunologic: Negative for environmental allergies.  Neurological:  Negative for dizziness, syncope and headaches.  Hematological:  Negative for adenopathy. Does not bruise/bleed easily.  Psychiatric/Behavioral:  Negative for decreased concentration and dysphoric mood. The patient is not nervous/anxious.       Objective:   Physical Exam Constitutional:      General: She is not in acute distress.     Appearance: Normal appearance. She is well-developed and normal weight. She is not ill-appearing or diaphoretic.  HENT:     Head: Normocephalic and atraumatic.     Right Ear: Tympanic membrane, ear canal and external ear normal.     Left Ear: Tympanic membrane, ear canal and external ear normal.     Nose: Nose normal. No congestion.     Mouth/Throat:     Mouth: Mucous membranes are moist.     Pharynx: Oropharynx is clear. No posterior oropharyngeal erythema.  Eyes:     General: No scleral icterus.    Extraocular Movements: Extraocular movements intact.     Conjunctiva/sclera: Conjunctivae normal.     Pupils: Pupils are equal, round, and reactive to light.  Neck:     Thyroid: No thyromegaly.     Vascular: No carotid bruit or JVD.  Cardiovascular:     Rate and Rhythm: Normal rate and regular rhythm.     Pulses: Normal pulses.     Heart sounds: Normal heart sounds.    No gallop.  Pulmonary:     Effort: Pulmonary effort is normal. No respiratory distress.     Breath sounds: Normal breath sounds. No wheezing.     Comments: Good air exch Chest:     Chest wall: No tenderness.  Abdominal:     General: Bowel sounds are normal. There is no distension or abdominal bruit.     Palpations: Abdomen is soft. There is no mass.     Tenderness: There is no abdominal tenderness.     Hernia: No hernia is present.  Genitourinary:    Comments: Breast exam: No mass, nodules, thickening, tenderness, bulging, retraction, inflamation, nipple discharge or skin changes noted.  No axillary or clavicular LA.     Musculoskeletal:        General: No tenderness. Normal range of motion.     Cervical  back: Normal range of motion and neck supple. No rigidity. No muscular tenderness.     Right lower leg: No edema.     Left lower leg: No edema.     Comments: No kyphosis   Lymphadenopathy:     Cervical: No cervical adenopathy.  Skin:    General: Skin is warm and dry.     Coloration: Skin is not pale.      Findings: No erythema or rash.     Comments: Solar lentigines diffusely   Neurological:     Mental Status: She is alert. Mental status is at baseline.     Cranial Nerves: No cranial nerve deficit.     Motor: No abnormal muscle tone.     Coordination: Coordination normal.     Gait: Gait normal.     Deep Tendon Reflexes: Reflexes are normal and symmetric. Reflexes normal.  Psychiatric:        Mood and Affect: Mood normal.        Cognition and Memory: Cognition and memory normal.          Assessment & Plan:   Problem List Items Addressed This Visit       Musculoskeletal and Integument   Osteopenia    dexa ordered No falls or fractures  Taking vit D Good exercise   Disc need for calcium/ vitamin D/ wt bearing exercise and bone density test every 2 y to monitor Disc safety/ fracture risk in detail          Other   Routine general medical examination at a health care facility - Primary    Reviewed health habits including diet and exercise and skin cancer prevention Reviewed appropriate screening tests for age  Also reviewed health mt list, fam hx and immunization status , as well as social and family history   See HPI Labs reviewed  covid immunized Plans flu shot at work prevnar 20 given today Mammogram utd dexa ordered, no falls  Colonoscopy utd- but not complete, pt choose to discuss cologuard in a year  Eye exam upcoming /her mother has macular degeneration      Estrogen deficiency   Relevant Orders   DG Bone Density   Mild hyperlipidemia    Disc goals for lipids and reasons to control them Rev last labs with pt Rev low sat fat diet in detail LDL up slightly  Plans to continue working on diet        Colon cancer screening    Colonoscopy 7/22-redundant colon Recommended virtual colonoscopy  Pt would like to hold off and discuss cologuard in a year       Other Visit Diagnoses     Need for pneumococcal vaccination       Relevant Orders    Pneumococcal conjugate vaccine 20-valent (Prevnar 20) (Completed)

## 2021-09-04 NOTE — Assessment & Plan Note (Signed)
Reviewed health habits including diet and exercise and skin cancer prevention Reviewed appropriate screening tests for age  Also reviewed health mt list, fam hx and immunization status , as well as social and family history   See HPI Labs reviewed  covid immunized Plans flu shot at work prevnar 20 given today Mammogram utd dexa ordered, no falls  Colonoscopy utd- but not complete, pt choose to discuss cologuard in a year  Eye exam upcoming /her mother has macular degeneration

## 2021-09-04 NOTE — Patient Instructions (Addendum)
Schedule a bone density test when convenient   Prevnar 20 vaccine today   We may consider a pulmonary referral for snoring   There is a great book called Keep Sharp by Coral Spikes   Talk to your eye doctor about Preservision

## 2021-09-04 NOTE — Assessment & Plan Note (Signed)
Consider pulm ref to discuss sleep study

## 2021-09-05 ENCOUNTER — Telehealth: Payer: Self-pay | Admitting: Family Medicine

## 2021-09-05 DIAGNOSIS — R0683 Snoring: Secondary | ICD-10-CM

## 2021-09-05 NOTE — Telephone Encounter (Signed)
Pt would like to see pulmonary for possible sleep apnea/loud snoring  Referral done

## 2021-09-11 DIAGNOSIS — H179 Unspecified corneal scar and opacity: Secondary | ICD-10-CM | POA: Diagnosis not present

## 2021-09-11 DIAGNOSIS — H2513 Age-related nuclear cataract, bilateral: Secondary | ICD-10-CM | POA: Diagnosis not present

## 2021-09-21 ENCOUNTER — Other Ambulatory Visit: Payer: Self-pay

## 2021-09-21 ENCOUNTER — Encounter: Payer: Self-pay | Admitting: Family Medicine

## 2021-09-21 NOTE — Telephone Encounter (Signed)
Called pt and offered her a virtual visit with Catalina Antigua, NP. Pt declined she said she "wasn't a fan of paxlovid" and only was going to take it if PCP recommended. Pt just wants to see what OTC meds Dr. Glori Bickers recommends to help with cough congestion and fever. She said PCP can send her a mychart message and let her know.

## 2021-09-26 ENCOUNTER — Other Ambulatory Visit: Payer: Self-pay

## 2021-09-26 ENCOUNTER — Encounter: Payer: Self-pay | Admitting: Emergency Medicine

## 2021-09-26 ENCOUNTER — Ambulatory Visit
Admission: EM | Admit: 2021-09-26 | Discharge: 2021-09-26 | Disposition: A | Discharge status: Home / Self Care | Payer: 59 | Attending: Emergency Medicine | Admitting: Emergency Medicine

## 2021-09-26 DIAGNOSIS — H60501 Unspecified acute noninfective otitis externa, right ear: Secondary | ICD-10-CM | POA: Diagnosis not present

## 2021-09-26 MED ORDER — NEOMYCIN-POLYMYXIN-HC 3.5-10000-1 OT SUSP
4.0000 [drp] | Freq: Three times a day (TID) | OTIC | 0 refills | Status: DC
  Filled 2021-09-26: qty 10, 12d supply, fill #0

## 2021-09-26 NOTE — Discharge Instructions (Addendum)
Use the ear drops as directed.  Follow up with your primary care provider if your symptoms are not improving.    

## 2021-09-26 NOTE — ED Provider Notes (Signed)
Roderic Palau    CSN: [id number] Arrival date & time: 09/26/21  1125      History   Chief Complaint Chief Complaint  Patient presents with   Otalgia    HPI Bridget [name] is a 65 y.o. female.  Patient presents with right ear pain and drainage x3 days.  She denies fever, chills, sore throat, cough, shortness of breath, or other symptoms.  She had viral symptoms last week but these have resolved.  She tested positive for COVID on 09/20/2021.  Her medical history includes degenerative joint disease, osteopenia, chronic back pain, hyperlipidemia, GERD.  The history is provided by the patient and medical records.   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   Cancer ([place])    BASAL CELL   Chronic back pain    GERD (gastroesophageal reflux disease)    OCC    Patient Active Problem List   Diagnosis Date Noted   Loud snoring 09/04/2021   Colon cancer screening 04/28/2021   Gluteal pain 10/13/2020   Mild hyperlipidemia 05/04/2018   Estrogen deficiency 04/30/2018   Pre-operative examination 02/21/2017   Low back pain 06/11/2016   Osteopenia 09/30/2013   Encounter for routine gynecological examination 01/18/2012   Routine general medical examination at a health care facility 01/09/2012   FASCIITIS, PLANTAR 02/06/2010   DEGENERATIVE JOINT DISEASE, MILD 07/19/2009    Past Surgical History:  Procedure Laterality Date   ANKLE SURGERY Right    BASAL CELL CARCINOMA EXCISION  12/06/2017   forehead   BREAST BIOPSY Right 02/05/2018   BREAST SURGERY  01/25/2018   Breast biopsy   CESAREAN SECTION  [phone]   hodp pre term labor CS   COLONOSCOPY WITH PROPOFOL N/A 07/05/2021   Procedure: COLONOSCOPY WITH PROPOFOL;  Surgeon: Jonathon Bellows, MD;  Location: [facility] ENDOSCOPY;  Service: Gastroenterology;  Laterality: N/A;   OVARIAN CYST SURGERY  1973   SHOULDER ARTHROSCOPY Right 10/31/2017   Procedure: ARTHROSCOPY SHOULDER WITH LYSIS OF  ADHESION, SUBACROMINAL DECOMPRESSION,DISTAL CLAVICLE EXCISION.;  Surgeon: Thornton Park, MD;  Location: [facility] ORS;  Service: Orthopedics;  Laterality: Right;  SHOULDER ARTHROSCOPY WITH LYSIS AND RESECTION OF ADHESIONS   SHOULDER ARTHROSCOPY WITH OPEN ROTATOR CUFF REPAIR AND DISTAL CLAVICLE ACROMINECTOMY Right 03/19/2017   Procedure: SHOULDER ARTHROSCOPY WITH OPEN ROTATOR CUFF REPAIR AND SUBACROMINAL DECOMPRESSION;  Surgeon: Thornton Park, MD;  Location: [facility] ORS;  Service: Orthopedics;  Laterality: Right;   TUBAL LIGATION  1999   WRIST GANGLION EXCISION      OB History   No obstetric history on file.      Home Medications    Prior to Admission medications   Medication Sig Start Date End Date Taking? Authorizing Provider  neomycin-polymyxin-hydrocortisone (CORTISPORIN) 3.5-10000-1 OTIC suspension Place 4 drops into the right ear 3 (three) times daily. 09/26/21  Yes Sharion Balloon, NP  Acetaminophen (TYLENOL 8 HOUR PO) Take by mouth.    [provider]  Cholecalciferol 25 MCG (1000 UT) capsule Take 2,000 Units by mouth daily.     [provider]  Docusate Calcium (STOOL SOFTENER PO) Take 2 tablets by mouth daily.    [provider]  ibuprofen (ADVIL,MOTRIN) 200 MG tablet Take 400 mg by mouth 2 (two) times daily as needed for mild pain.     [provider]  meloxicam (MOBIC) 7.5 MG tablet Take 1 tablet by mouth daily as needed. 03/07/18   [provider]  Multiple Vitamin (MULTIVITAMIN) capsule  Take 1 capsule by mouth daily.    [provider]  Polyethyl Glycol-Propyl Glycol (SYSTANE OP) Apply 2 drops to eye as needed (dry eyes).    [provider]  Probiotic Product (PROBIOTIC ADVANCED PO) Take by mouth as needed.    [provider]    Family History Family History  Problem Relation Age of Onset   Arthritis Mother        OA with verterabral fx   Hypertension Mother    Fibrocystic breast disease Mother    Cancer  Maternal Grandfather        colon CA   Diabetes Paternal Grandmother     Social History Social History   Tobacco Use   Smoking status: Never   Smokeless tobacco: Never  Vaping Use   Vaping Use: Never used  Substance Use Topics   Alcohol use: Yes    Alcohol/week: 4.0 standard drinks    Types: 4 Glasses of wine per week    Comment: 2-3 nights per week   Drug use: No     Allergies   Lorcet [hydrocodone-acetaminophen]   Review of Systems Review of Systems  Constitutional:  Negative for chills and fever.  HENT:  Positive for ear discharge and ear pain. Negative for sore throat.   Respiratory:  Negative for cough and shortness of breath.   Cardiovascular:  Negative for chest pain and palpitations.  Gastrointestinal:  Negative for abdominal pain and vomiting.  Skin:  Negative for color change and rash.  All other systems reviewed and are negative.   Physical Exam Triage Vital Signs ED Triage Vitals  Enc Vitals Group     BP      Pulse      Resp      Temp      Temp src      SpO2      Weight      Height      Head Circumference      Peak Flow      Pain Score      Pain Loc      Pain Edu?      Excl. in [place]?    No data found.  Updated Vital Signs BP 128/82 (BP Location: Left Arm)   Pulse 74   Temp 98.1 F (36.7 C) (Oral)   Resp 18   SpO2 94%   Visual Acuity Right Eye Distance:   Left Eye Distance:   Bilateral Distance:    Right Eye Near:   Left Eye Near:    Bilateral Near:     Physical Exam Vitals and nursing note reviewed.  Constitutional:      General: She is not in acute distress.    Appearance: She is well-developed.  HENT:     Head: Normocephalic and atraumatic.     Right Ear: Tympanic membrane normal. Drainage and swelling present.     Left Ear: Tympanic membrane and ear canal normal.     Ears:     Comments: Green-white drainage in right ear canal.    Nose: Nose normal.     Mouth/Throat:     Mouth: Mucous membranes are moist.      Pharynx: Oropharynx is clear.  Eyes:     Conjunctiva/sclera: Conjunctivae normal.  Cardiovascular:     Rate and Rhythm: Normal rate and regular rhythm.     Heart sounds: Normal heart sounds.  Pulmonary:     Effort: Pulmonary effort is normal. No respiratory distress.  Breath sounds: Normal breath sounds.  Abdominal:     Palpations: Abdomen is soft.     Tenderness: There is no abdominal tenderness.  Musculoskeletal:     Cervical back: Neck supple.  Skin:    General: Skin is warm and dry.  Neurological:     General: No focal deficit present.     Mental Status: She is alert and oriented to person, place, and time.     Gait: Gait normal.  Psychiatric:        Mood and Affect: Mood normal.        Behavior: Behavior normal.     UC Treatments / Results  Labs (all labs ordered are listed, but only abnormal results are displayed) Labs Reviewed - No data to display  EKG   Radiology No results found.  Procedures Procedures (including critical care time)  Medications Ordered in UC Medications - No data to display  Initial Impression / Assessment and Plan / UC Course  I have reviewed the triage vital signs and the nursing notes.  Pertinent labs & imaging results that were available during my care of the patient were reviewed by me and considered in my medical decision making (see chart for details).  Right otitis externa.  Treating with Cortisporin eardrops.  Education provided on otitis externa.  Instructed patient to follow-up with her PCP if her symptoms are not improving.  She agrees to plan of care.   Final Clinical Impressions(s) / UC Diagnoses   Final diagnoses:  Acute otitis externa of right ear, unspecified type     Discharge Instructions      Use the ear drops as directed.    Follow up with your primary care provider if your symptoms are not improving.         ED Prescriptions     Medication Sig Dispense Auth. Provider    neomycin-polymyxin-hydrocortisone (CORTISPORIN) 3.5-10000-1 OTIC suspension Place 4 drops into the right ear 3 (three) times daily. 10 mL Sharion Balloon, NP      PDMP not reviewed this encounter.   Sharion Balloon, NP 09/26/21 [phone]

## 2021-09-26 NOTE — ED Triage Notes (Signed)
Pt here with right ear pain x 3 days. Covid positive over one week.

## 2021-10-05 ENCOUNTER — Encounter: Payer: Self-pay | Admitting: Pulmonary Disease

## 2021-10-05 ENCOUNTER — Ambulatory Visit (INDEPENDENT_AMBULATORY_CARE_PROVIDER_SITE_OTHER): Payer: 59 | Admitting: Pulmonary Disease

## 2021-10-05 ENCOUNTER — Other Ambulatory Visit: Payer: Self-pay

## 2021-10-05 VITALS — BP 110/62 | HR 83 | Temp 97.3°F | Ht 63.0 in | Wt 156.2 lb

## 2021-10-05 DIAGNOSIS — R0683 Snoring: Secondary | ICD-10-CM | POA: Diagnosis not present

## 2021-10-05 NOTE — Progress Notes (Addendum)
[facility]  Chief Complaint  Patient presents with   Consult    Assess for OSA    Past Surgical History:  She  has a past surgical history that includes Ovarian cyst surgery (1973); Cesarean section [phone]); Tubal ligation (1999); Ankle surgery (Right); Wrist ganglion excision; Shoulder arthroscopy with open rotator cuff repair and distal clavicle acrominectomy (Right, 03/19/2017); Shoulder arthroscopy (Right, 10/31/2017); Excision basal cell carcinoma (12/06/2017); Breast surgery (01/25/2018); Breast biopsy (Right, 02/05/2018); and Colonoscopy with propofol (N/A, 07/05/2021).  Past Medical History:  OA, Back pain, GERD  Constitutional:  BP 110/62 (BP Location: Left Arm, Patient Position: Sitting, Cuff Size: Normal)   Pulse 83   Temp (!) 97.3 F (36.3 C) (Temporal)   Ht 5\' 3"  (1.6 m)   Wt 156 lb 3.2 oz (70.9 kg)   SpO2 99%   BMI 27.67 kg/m   Brief Summary:  Bridget [name] is a 65 y.o. female with snoring.      Subjective:   Her husband has been concerned about her snoring.  Says she makes funny noises with her breathing and gets shallow breathing.  She doesn't dream much.  Used to need a mouth guard to help with teeth grinding.  Can fall asleep when watching a boring movie.  She goes to sleep at midnight.  She falls asleep 10 minutes usually, but sometimes can take up to an hour.  She wakes up 1 time during the night to look at the clock, and then usually falls back to sleep.  She gets out of bed at 7 am.  She feels okay in the morning.  She denies morning headache.  She does not use anything to help her fall sleep or stay awake.  She denies sleep walking, sleep talking, bruxism, or nightmares.  There is no history of restless legs.  She denies sleep hallucinations, sleep paralysis, or cataplexy.  The Epworth score is 2 out of 24.  She had COVID in September.  Didn't require any specific therapy.  No respiratory  symptoms at present.  Physical Exam:   Appearance - well kempt   ENMT - no sinus tenderness, no oral exudate, no LAN, Mallampati 3 airway, no stridor  Respiratory - equal breath sounds bilaterally, no wheezing or rales  CV - s1s2 regular rate and rhythm, no murmurs  Ext - no clubbing, no edema  Skin - no rashes  Psych - normal mood and affect   Sleep Tests:    Social History:  She  reports that she has never smoked. She has never used smokeless tobacco. She reports current alcohol use of about 4.0 standard drinks per week. She reports that she does not use drugs.  Family History:  Her family history includes Arthritis in her mother; Cancer in her maternal grandfather; Diabetes in her paternal grandmother; Fibrocystic breast disease in her mother; Hypertension in her mother.    Discussion:  She has snoring, sleep disruption, apnea, and daytime sleepiness.  It is possible she could have obstructive sleep apnea.  Assessment/Plan:   Snoring with excessive daytime sleepiness. - will need to arrange for a home sleep study  Obesity. - discussed how weight can impact sleep and risk for sleep disordered breathing - discussed options to assist with weight loss: combination of diet modification, cardiovascular and strength training exercises  Cardiovascular risk. - had an extensive discussion regarding the adverse health consequences related to untreated sleep disordered breathing - specifically discussed the risks for hypertension, coronary  artery disease, cardiac dysrhythmias, cerebrovascular disease, and diabetes - lifestyle modification discussed  Safe driving practices. - discussed how sleep disruption can increase risk of accidents, particularly when driving - safe driving practices were discussed  Therapies for obstructive sleep apnea. - if the sleep study shows significant sleep apnea, then various therapies for treatment were reviewed: CPAP, oral appliance, and  surgical interventions   Time Spent Involved in Patient Care on Day of Examination:  31 minutes  Follow up:   Patient Instructions  Will arrange for home sleep study Will call to arrange for follow up after sleep study reviewed  Medication List:   Allergies as of 10/05/2021       Reactions   Lorcet [hydrocodone-acetaminophen] Rash        Medication List        Accurate as of October 05, 2021 11:40 AM. If you have any questions, ask your nurse or doctor.          Cholecalciferol 25 MCG (1000 UT) capsule Take 2,000 Units by mouth daily.   ibuprofen 200 MG tablet Commonly known as: ADVIL Take 400 mg by mouth 2 (two) times daily as needed for mild pain.   meloxicam 7.5 MG tablet Commonly known as: MOBIC Take 1 tablet by mouth daily as needed.   multivitamin capsule Take 1 capsule by mouth daily.   neomycin-polymyxin-hydrocortisone 3.5-10000-1 OTIC suspension Commonly known as: CORTISPORIN Place 4 drops into the right ear 3 (three) times daily.   PROBIOTIC ADVANCED PO Take by mouth as needed.   STOOL SOFTENER PO Take 2 tablets by mouth daily.   SYSTANE OP Apply 2 drops to eye as needed (dry eyes).   TYLENOL 8 HOUR PO Take by mouth.        Signature:  Chesley Mires, MD [place] Pager - [phone] 10/05/2021, 11:40 AM

## 2021-10-05 NOTE — Patient Instructions (Signed)
Will arrange for home sleep study Will call to arrange for follow up after sleep study reviewed  

## 2021-10-16 DIAGNOSIS — L905 Scar conditions and fibrosis of skin: Secondary | ICD-10-CM | POA: Diagnosis not present

## 2021-10-16 DIAGNOSIS — D485 Neoplasm of uncertain behavior of skin: Secondary | ICD-10-CM | POA: Diagnosis not present

## 2021-11-23 ENCOUNTER — Telehealth: Payer: Self-pay

## 2021-11-23 NOTE — Telephone Encounter (Signed)
Noted.  Will await UC notes.  Thanks.

## 2021-11-23 NOTE — Telephone Encounter (Signed)
Aware, will watch for correspondence  

## 2021-11-23 NOTE — Telephone Encounter (Signed)
I spoke with pt; pt said symptoms started with S/T on 11/18/21; today the throat is not as severe pain when swallows. Pt had strep test on 11/21/21 which were neg. Pt had two neg covid test recently (the last covid test was done on 11/21/21). Today pts fever is going up and presently 100.4 and pt is now coughing up green phlegm. No wheezing and no pain in chest. After coughing with hard episode of coughing pt has some SOB. Pt had pneumonia as a child. Pt wonders if should start on abx or not. No available appts at [facility].Pt is going to [place] to be eval now. Pt said she would feel better if someone listened to her chest since she has been sick for 6 days and now worsening. Sending note to DR Glori Bickers who is out of office, Dr Damita Dunnings who is in office and [place] CMA.

## 2021-11-23 NOTE — Telephone Encounter (Signed)
[place] Night - Client TELEPHONE ADVICE RECORD [facility] Patient Name: Bridget [name] [place] Gender: Female DOB: Oct 19, 1956 Age: 65 Y 3 M 12 D Return Phone Number: [phone] (Primary), [phone] (Secondary) Address: [place]/ [place] [address] Client [place] Night - Client Client Site [place] Provider Glori Bickers, Roque Lias - MD Contact Type Call Who Is Calling Patient / Member / Family / Caregiver Call Type Triage / Clinical Relationship To Patient Self Return Phone Number [phone] (Primary) Chief Complaint Cough Reason for Call Symptomatic / Request for [place] states she is coughing up phlegm, fever of 100.3, congestion and body aches. Caller states she goes by Bridget [name].Caller stats she was exposed to the flu over the holiday. Caller states she wanted to know if she can get some medication. Translation No Nurse Assessment Nurse: Markus Daft, RN, Sherre Poot Date/Time (Eastern Time): 11/23/2021 10:52:43 AM Confirm and document reason for call. If symptomatic, describe symptoms. ---Caller c/o productive cough with green mucus, fever of 100.3 by mouth, nasal congestion, and body aches. S/S started Saturday with severe sore throat. Family visiting had the flu. Strep test Tuesday was negative. Tested at home with COVID Sunday and Tuesday and negative. Does the patient have any new or worsening symptoms? ---Yes Will a triage be completed? ---Yes Related visit to physician within the last 2 weeks? ---No Does the PT have any chronic conditions? (i.e. diabetes, asthma, this includes High risk factors for pregnancy, etc.) ---No Is this a behavioral health or substance abuse call? ---No Guidelines Guideline Title Affirmed Question Affirmed Notes Nurse Date/Time (Eastern Time) Influenza - Seasonal [1] Probable influenza (fever) with no complications AND [9]  NOT HIGH RISK Markus Daft, RN, [place] 11/23/2021 10:55:23 AM PLEASE NOTE: All timestamps contained within this report are represented as [place] Standard Time. CONFIDENTIALTY NOTICE: This fax transmission is intended only for the addressee. It contains information that is legally privileged, confidential or otherwise protected from use or disclosure. If you are not the intended recipient, you are strictly prohibited from reviewing, disclosing, copying using or disseminating any of this information or taking any action in reliance on or regarding this information. If you have received this fax in error, please notify us immediately by telephone so that we can arrange for its return to [place]. Phone: [phone], Toll-Free: [phone], Fax: [phone] Page: 2 of 2 Call Id: [id number] [place]. Time Eilene Ghazi Time) Disposition Final User 11/23/2021 10:18:47 AM Send To Nurse Lennox Laity, RN, Olin Hauser 11/23/2021 11:02:15 AM [place], RN, Kenton Kingfisher Disagree/Comply Comply Caller Understands Yes PreDisposition Call Doctor Care Advice Given Per Guideline HOME CARE: * You should be able to treat this at home. REASSURANCE AND EDUCATION - INFLUENZA: * Influenza is commonly known as the 'flu'. * Influenza is a respiratory illness that is easily spread from person-to-person. The most common symptoms are the sudden onset of fever, muscle aches, cough, runny nose, sore throat, fatigue and headache. For healthy people, the symptoms of influenza are similar to those of the common cold. However, with influenza, the onset is more abrupt and fever is higher. Feeling very sick for the first 3 days is common. * The treatment of influenza depends on your main symptoms. It is usually no different from that used for other viral respiratory infections. Most people who get sick with influenza get better at home without special treatment. INFLUENZA - GENERAL CARE ADVICE: * Cough: Use cough drops. * Feeling dehydrated:  Drink extra liquids. If the air in your home is dry, use a humidifier. * Fever: For fever over 101 F (38.3 C), take acetaminophen every 4 to 6 hours (Adults 650 mg) OR ibuprofen every 6 to 8 hours (Adults 400 mg). * Muscle aches, headache, and other pains: Often this comes and goes with the fever. Take acetaminophen every 4 to 6 hours (Adults 650 mg) OR ibuprofen every 6 to 8 hours (Adults 400 mg). * Sore throat: Try throat lozenges, hard candy or warm chicken broth. NASAL WASHES FOR A STUFFY NOSE: * Introduction: Saline (salt water) nasal irrigation (nasal wash) is an effective and simple home remedy for treating stuffy nose and sinus congestion. The nose can be irrigated by pouring, spraying, or squirting salt water into the nose and then letting it run back out. * How it Helps: The salt water rinses out excess mucus and washes out any irritants (dust, allergens) that might be present. It also moistens the nasal cavity. * Methods: There are several ways to irrigate the nose. You can use a saline nasal spray bottle (available over-the-counter), a rubber ear syringe, a medical syringe without the needle, or a NETI POT. EXPECTED COURSE: * Fever 2 to 3 days * Nasal discharge 7 to 14 days * Cough 2 to 3 weeks CALL BACK IF: * Fever lasts over 3 days * Runny nose lasts over 10 days * Cough lasts over 3 weeks * Difficulty breathing occurs * You become worse CARE ADVICE given per Influenza - Seasonal (Adult) guideline

## 2021-11-24 ENCOUNTER — Ambulatory Visit
Admission: RE | Admit: 2021-11-24 | Discharge: 2021-11-24 | Disposition: A | Discharge status: Home / Self Care | Payer: 59 | Source: Ambulatory Visit | Attending: Emergency Medicine | Admitting: Emergency Medicine

## 2021-11-24 ENCOUNTER — Other Ambulatory Visit: Payer: Self-pay

## 2021-11-24 VITALS — BP 123/80 | HR 81 | Temp 98.9°F | Resp 18

## 2021-11-24 DIAGNOSIS — J0100 Acute maxillary sinusitis, unspecified: Secondary | ICD-10-CM

## 2021-11-24 MED ORDER — AMOXICILLIN 875 MG PO TABS
875.0000 mg | ORAL_TABLET | Freq: Two times a day (BID) | ORAL | 0 refills | Status: AC
  Filled 2021-11-24: qty 20, 10d supply, fill #0

## 2021-11-24 NOTE — ED Triage Notes (Signed)
Pt here with direct exposure to the flu from grandchildren. Pt presents with flu-like sx and cough x 1 week.

## 2021-11-24 NOTE — ED Provider Notes (Signed)
[facility]    CSN: [id number] Arrival date & time: 11/24/21  1158      History   Chief Complaint Chief Complaint  Patient presents with   Cough   Nasal Congestion    HPI Lekesha Claw is a 65 y.o. female.  Patient presents with 7-day history of cough productive of green phlegm, congestion, sore throat, fever.  T-max 100.  She has also reports mild shortness of breath.  No rash, vomiting, diarrhea, or other symptoms.  Negative strep test on 11/21/2021.  Negative COVID test on 11/21/2021.  Patient reports her grandsons have influenza.  The history is provided by the patient and medical records.   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   Cancer ([place])    BASAL CELL   Chronic back pain    COVID-19 08/2021   GERD (gastroesophageal reflux disease)    OCC    Patient Active Problem List   Diagnosis Date Noted   Loud snoring 09/04/2021   Colon cancer screening 04/28/2021   Gluteal pain 10/13/2020   Mild hyperlipidemia 05/04/2018   Estrogen deficiency 04/30/2018   Pre-operative examination 02/21/2017   Low back pain 06/11/2016   Osteopenia 09/30/2013   Encounter for routine gynecological examination 01/18/2012   Routine general medical examination at a health care facility 01/09/2012   FASCIITIS, PLANTAR 02/06/2010   DEGENERATIVE JOINT DISEASE, MILD 07/19/2009    Past Surgical History:  Procedure Laterality Date   ANKLE SURGERY Right    BASAL CELL CARCINOMA EXCISION  12/06/2017   forehead   BREAST BIOPSY Right 02/05/2018   BREAST SURGERY  01/25/2018   Breast biopsy   CESAREAN SECTION  [phone]   hodp pre term labor CS   COLONOSCOPY WITH PROPOFOL N/A 07/05/2021   Procedure: COLONOSCOPY WITH PROPOFOL;  Surgeon: Jonathon Bellows, MD;  Location[facility] ENDOSCOPY;  Service: Gastroenterology;  Laterality: N/A;   OVARIAN CYST SURGERY  1973   SHOULDER ARTHROSCOPY Right 10/31/2017   Procedure: ARTHROSCOPY SHOULDER WITH  LYSIS OF ADHESION, SUBACROMINAL DECOMPRESSION,DISTAL CLAVICLE EXCISION.;  Surgeon: Thornton Park, MD;  Location: [facility] ORS;  Service: Orthopedics;  Laterality: Right;  SHOULDER ARTHROSCOPY WITH LYSIS AND RESECTION OF ADHESIONS   SHOULDER ARTHROSCOPY WITH OPEN ROTATOR CUFF REPAIR AND DISTAL CLAVICLE ACROMINECTOMY Right 03/19/2017   Procedure: SHOULDER ARTHROSCOPY WITH OPEN ROTATOR CUFF REPAIR AND SUBACROMINAL DECOMPRESSION;  Surgeon: Thornton Park, MD;  Location: [facility] ORS;  Service: Orthopedics;  Laterality: Right;   TUBAL LIGATION  1999   WRIST GANGLION EXCISION      OB History   No obstetric history on file.      Home Medications    Prior to Admission medications   Medication Sig Start Date End Date Taking? Authorizing Provider  amoxicillin (AMOXIL) 875 MG tablet Take 1 tablet (875 mg total) by mouth 2 (two) times daily for 10 days. 11/24/21 12/04/21 Yes Sharion Balloon, NP  Acetaminophen (TYLENOL 8 HOUR PO) Take by mouth.    [provider]  Cholecalciferol 25 MCG (1000 UT) capsule Take 2,000 Units by mouth daily.     [provider]  Docusate Calcium (STOOL SOFTENER PO) Take 2 tablets by mouth daily.    [provider]  ibuprofen (ADVIL,MOTRIN) 200 MG tablet Take 400 mg by mouth 2 (two) times daily as needed for mild pain.     [provider]  meloxicam (MOBIC) 7.5 MG tablet Take 1 tablet by mouth daily as needed. 03/07/18  [provider]  Multiple Vitamin (MULTIVITAMIN) capsule Take 1 capsule by mouth daily.    [provider]  neomycin-polymyxin-hydrocortisone (CORTISPORIN) 3.5-10000-1 OTIC suspension Place 4 drops into the right ear 3 (three) times daily. 09/26/21   Sharion Balloon, NP  Polyethyl Glycol-Propyl Glycol (SYSTANE OP) Apply 2 drops to eye as needed (dry eyes).    [provider]  Probiotic Product (PROBIOTIC ADVANCED PO) Take by mouth as needed.    [provider]    Family History Family History   Problem Relation Age of Onset   Arthritis Mother        OA with verterabral fx   Hypertension Mother    Fibrocystic breast disease Mother    Cancer Maternal Grandfather        colon CA   Diabetes Paternal Grandmother     Social History Social History   Tobacco Use   Smoking status: Never   Smokeless tobacco: Never  Vaping Use   Vaping Use: Never used  Substance Use Topics   Alcohol use: Yes    Alcohol/week: 4.0 standard drinks    Types: 4 Glasses of wine per week    Comment: 2-3 nights per week   Drug use: No     Allergies   Lorcet [hydrocodone-acetaminophen]   Review of Systems Review of Systems  Constitutional:  Positive for fever. Negative for chills.  HENT:  Positive for congestion and sore throat. Negative for ear pain.   Respiratory:  Positive for cough and shortness of breath.   Cardiovascular:  Negative for chest pain and palpitations.  Gastrointestinal:  Negative for diarrhea and vomiting.  Skin:  Negative for color change and rash.  All other systems reviewed and are negative.   Physical Exam Triage Vital Signs ED Triage Vitals  Enc Vitals Group     BP      Pulse      Resp      Temp      Temp src      SpO2      Weight      Height      Head Circumference      Peak Flow      Pain Score      Pain Loc      Pain Edu?      Excl. in [place]?    No data found.  Updated Vital Signs BP 123/80   Pulse 81   Temp 98.9 F (37.2 C)   Resp 18   SpO2 98%   Visual Acuity Right Eye Distance:   Left Eye Distance:   Bilateral Distance:    Right Eye Near:   Left Eye Near:    Bilateral Near:     Physical Exam Vitals and nursing note reviewed.  Constitutional:      General: She is not in acute distress.    Appearance: She is well-developed.  HENT:     Head: Normocephalic and atraumatic.     Right Ear: Tympanic membrane normal.     Left Ear: Tympanic membrane normal.     Nose: Congestion present.     Mouth/Throat:     Mouth: Mucous membranes  are moist.     Pharynx: Posterior oropharyngeal erythema present.  Cardiovascular:     Rate and Rhythm: Normal rate and regular rhythm.     Heart sounds: Normal heart sounds.  Pulmonary:     Effort: Pulmonary effort is normal. No respiratory distress.     Breath sounds: Normal  breath sounds.  Musculoskeletal:     Cervical back: Neck supple.  Skin:    General: Skin is warm and dry.  Neurological:     Mental Status: She is alert.  Psychiatric:        Mood and Affect: Mood normal.        Behavior: Behavior normal.     UC Treatments / Results  Labs (all labs ordered are listed, but only abnormal results are displayed) Labs Reviewed - No data to display  EKG   Radiology No results found.  Procedures Procedures (including critical care time)  Medications Ordered in UC Medications - No data to display  Initial Impression / Assessment and Plan / UC Course  I have reviewed the triage vital signs and the nursing notes.  Pertinent labs & imaging results that were available during my care of the patient were reviewed by me and considered in my medical decision making (see chart for details).  Acute sinusitis.  Patient has been symptomatic for 7 days.  Treating with amoxicillin.  Discussed continued symptomatic treatment also.  Instructed to follow-up with PCP if her symptoms are not improving.  She agrees to plan of care.   Final Clinical Impressions(s) / UC Diagnoses   Final diagnoses:  Acute non-recurrent maxillary sinusitis     Discharge Instructions      Take the amoxicillin as directed.  Follow up with your primary care provider if your symptoms are not improving.         ED Prescriptions     Medication Sig Dispense Auth. Provider   amoxicillin (AMOXIL) 875 MG tablet Take 1 tablet (875 mg total) by mouth 2 (two) times daily for 10 days. 20 tablet Sharion Balloon, NP      PDMP not reviewed this encounter.   Sharion Balloon, NP 11/24/21 1242

## 2021-11-24 NOTE — Discharge Instructions (Addendum)
Take the amoxicillin as directed.  Follow up with your primary care provider if your symptoms are not improving.   ° ° °

## 2021-12-12 ENCOUNTER — Ambulatory Visit: Payer: 59

## 2021-12-12 ENCOUNTER — Other Ambulatory Visit: Payer: Self-pay

## 2021-12-12 DIAGNOSIS — G4733 Obstructive sleep apnea (adult) (pediatric): Secondary | ICD-10-CM

## 2021-12-12 DIAGNOSIS — R0683 Snoring: Secondary | ICD-10-CM

## 2021-12-13 ENCOUNTER — Telehealth: Payer: Self-pay | Admitting: Pulmonary Disease

## 2021-12-13 DIAGNOSIS — G4733 Obstructive sleep apnea (adult) (pediatric): Secondary | ICD-10-CM | POA: Diagnosis not present

## 2021-12-13 NOTE — Telephone Encounter (Signed)
HST 12/13/21 >> AHI 11, SpO2 low 80%   Please inform her that her sleep study shows mild obstructive sleep apnea.  Please arrange for ROV with me or NP to discuss treatment options.

## 2021-12-15 NOTE — Telephone Encounter (Signed)
Called and spoke to patient. Gave her HST results. She voiced understanding. States she will call back next week when she has her calendar available and schedule the appt. Requested by Dr. Halford Chessman.

## 2022-01-01 ENCOUNTER — Telehealth (INDEPENDENT_AMBULATORY_CARE_PROVIDER_SITE_OTHER): Payer: PPO | Admitting: Primary Care

## 2022-01-01 ENCOUNTER — Encounter: Payer: Self-pay | Admitting: Primary Care

## 2022-01-01 ENCOUNTER — Other Ambulatory Visit: Payer: Self-pay | Admitting: Family Medicine

## 2022-01-01 ENCOUNTER — Other Ambulatory Visit: Payer: Self-pay

## 2022-01-01 DIAGNOSIS — Z1231 Encounter for screening mammogram for malignant neoplasm of breast: Secondary | ICD-10-CM

## 2022-01-01 DIAGNOSIS — G4730 Sleep apnea, unspecified: Secondary | ICD-10-CM

## 2022-01-01 DIAGNOSIS — R0683 Snoring: Secondary | ICD-10-CM | POA: Diagnosis not present

## 2022-01-01 NOTE — Progress Notes (Signed)
Virtual Visit via Video Note  I connected with Bridget [name] on 01/01/22 at  9:30 AM EST by a video enabled telemedicine application and verified that I am speaking with the correct person using two identifiers.  Location: Patient: Home Provider: Office   I discussed the limitations of evaluation and management by telemedicine and the availability of in person appointments. The patient expressed understanding and agreed to proceed.  History of Present Illness: 66 year old female. PMH significant for snoring. Patient of Dr. Halford Chessman, seen for initial consul on 10/05/21 for sleep consult.   Previous [facility] pulmonary encounter: 10/05/21- Sleep consult- Dr. Halford Chessman  Her husband has been concerned about her snoring.  Says she makes funny noises with her breathing and gets shallow breathing.  She doesn't dream much.  Used to need a mouth guard to help with teeth grinding.  Can fall asleep when watching a boring movie.  She goes to sleep at midnight.  She falls asleep 10 minutes usually, but sometimes can take up to an hour.  She wakes up 1 time during the night to look at the clock, and then usually falls back to sleep.  She gets out of bed at 7 am.  She feels okay in the morning.  She denies morning headache.  She does not use anything to help her fall sleep or stay awake.  She denies sleep walking, sleep talking, bruxism, or nightmares.  There is no history of restless legs.  She denies sleep hallucinations, sleep paralysis, or cataplexy.  The Epworth score is 2 out of 24.  She had COVID in September.  Didn't require any specific therapy.  No respiratory symptoms at present.  01/01/2022- Interim hx  Patient contacted today to review sleep study results. Patient has symptoms of snoring and teeth grinding. HST on 12/13/21 showed mild OSA, AHI 11/hr with SpO2 80% (average 92%). We reviewed risk of untreated sleep apnea and treatment options including weight loss, side sleeping position, CPAP  therapy or referral to ENT for possible surgical options. She is interested in being set up with oral appliance for treatment of her mild sleep apnea.    Observations/Objective:  - Appears well; No overt shortness of breath, wheezing or cough  Assessment and Plan:  Mild OSA: - HST 12/13/21 >> AHI 11/hr. Reviewed sleep study results, risk of untreated sleep apnea and treatment options. She is interested in oral appliance. Referral placed to Dr. Augustina Mood with orthodontics. Encourage patient to maintain normal BMI range. Advised against driving if experiencing excessive daytime sleepiness.   Follow Up Instructions:  - 6 month follow-up or soon if needed    I discussed the assessment and treatment plan with the patient. The patient was provided an opportunity to ask questions and all were answered. The patient agreed with the plan and demonstrated an understanding of the instructions.   The patient was advised to call back or seek an in-person evaluation if the symptoms worsen or if the condition fails to improve as anticipated.  I provided 22 minutes of non-face-to-face time during this encounter.   Martyn Ehrich, NP

## 2022-01-01 NOTE — Patient Instructions (Signed)
Home sleep study on 12/13/21 showed evidence fo mild obstructive sleep apnea, you have on average 11 events an hour SpO2 low 80%, average 92%  Referring you to Dr. Augustina Mood [phone]  Referral: Orthodontics   Follow-up: 6 months with Eustaquio Maize NP

## 2022-01-01 NOTE — Progress Notes (Signed)
Reviewed and agree with assessment/plan.   Chesley Mires, MD [facility] Pulmonary/Critical Care 01/01/2022, 5:27 PM Pager:  [phone]

## 2022-01-01 NOTE — Telephone Encounter (Signed)
Video visit completed on 01/01/22.

## 2022-03-08 ENCOUNTER — Ambulatory Visit
Admission: RE | Admit: 2022-03-08 | Discharge: 2022-03-08 | Disposition: A | Discharge status: Home / Self Care | Payer: PPO | Source: Ambulatory Visit | Attending: Family Medicine | Admitting: Family Medicine

## 2022-03-08 DIAGNOSIS — Z1231 Encounter for screening mammogram for malignant neoplasm of breast: Secondary | ICD-10-CM | POA: Diagnosis not present

## 2022-03-08 DIAGNOSIS — Z780 Asymptomatic menopausal state: Secondary | ICD-10-CM | POA: Diagnosis not present

## 2022-03-08 DIAGNOSIS — M8589 Other specified disorders of bone density and structure, multiple sites: Secondary | ICD-10-CM | POA: Diagnosis not present

## 2022-04-16 DIAGNOSIS — L814 Other melanin hyperpigmentation: Secondary | ICD-10-CM | POA: Diagnosis not present

## 2022-04-16 DIAGNOSIS — L821 Other seborrheic keratosis: Secondary | ICD-10-CM | POA: Diagnosis not present

## 2022-04-16 DIAGNOSIS — D2262 Melanocytic nevi of left upper limb, including shoulder: Secondary | ICD-10-CM | POA: Diagnosis not present

## 2022-04-16 DIAGNOSIS — D225 Melanocytic nevi of trunk: Secondary | ICD-10-CM | POA: Diagnosis not present

## 2022-04-16 DIAGNOSIS — D2272 Melanocytic nevi of left lower limb, including hip: Secondary | ICD-10-CM | POA: Diagnosis not present

## 2022-04-16 DIAGNOSIS — D2271 Melanocytic nevi of right lower limb, including hip: Secondary | ICD-10-CM | POA: Diagnosis not present

## 2022-04-16 DIAGNOSIS — X32XXXA Exposure to sunlight, initial encounter: Secondary | ICD-10-CM | POA: Diagnosis not present

## 2022-04-16 DIAGNOSIS — D2261 Melanocytic nevi of right upper limb, including shoulder: Secondary | ICD-10-CM | POA: Diagnosis not present

## 2022-08-24 ENCOUNTER — Telehealth: Payer: Self-pay | Admitting: Family Medicine

## 2022-08-24 DIAGNOSIS — M5432 Sciatica, left side: Secondary | ICD-10-CM

## 2022-08-24 NOTE — Telephone Encounter (Signed)
Patient called and said she is having severe sciatica again and that Dr Glori Bickers has referred her to Physical Therapy in the past for it and she wanted to know if she can put in another referral for her. Call back is [phone]

## 2022-08-24 NOTE — Telephone Encounter (Signed)
Any new symptoms? No new sxs Worse on right or left? Left side is worse Where does pain radiate to? Radiates down leg Any new loss of bowel or bladder function? no Any focal numbness or weakness? No   Pt said it feels the exact same as last time she has pain and spasms on the left side of her buttocks and it radiates down her leg.

## 2022-08-24 NOTE — Telephone Encounter (Signed)
Any new symptoms?  Worse on right or left? Where does pain radiate to ?  Any new loss of bowel or bladder function? Any focal numbness or weakness   Thanks

## 2022-08-25 DIAGNOSIS — M545 Low back pain, unspecified: Secondary | ICD-10-CM | POA: Diagnosis not present

## 2022-08-25 DIAGNOSIS — M5416 Radiculopathy, lumbar region: Secondary | ICD-10-CM | POA: Diagnosis not present

## 2022-08-27 DIAGNOSIS — M5430 Sciatica, unspecified side: Secondary | ICD-10-CM | POA: Insufficient documentation

## 2022-08-27 NOTE — Telephone Encounter (Signed)
The referral is in  Please alert [place] if you don't hear in the next week

## 2022-08-27 NOTE — Assessment & Plan Note (Signed)
Acute on chronic  PT has helped in the past

## 2022-08-29 DIAGNOSIS — M25552 Pain in left hip: Secondary | ICD-10-CM | POA: Diagnosis not present

## 2022-08-29 DIAGNOSIS — M5117 Intervertebral disc disorders with radiculopathy, lumbosacral region: Secondary | ICD-10-CM | POA: Diagnosis not present

## 2022-08-30 DIAGNOSIS — M5117 Intervertebral disc disorders with radiculopathy, lumbosacral region: Secondary | ICD-10-CM | POA: Diagnosis not present

## 2022-08-30 DIAGNOSIS — M25552 Pain in left hip: Secondary | ICD-10-CM | POA: Diagnosis not present

## 2022-09-03 DIAGNOSIS — M25552 Pain in left hip: Secondary | ICD-10-CM | POA: Diagnosis not present

## 2022-09-03 DIAGNOSIS — M5117 Intervertebral disc disorders with radiculopathy, lumbosacral region: Secondary | ICD-10-CM | POA: Diagnosis not present

## 2022-09-05 DIAGNOSIS — M25552 Pain in left hip: Secondary | ICD-10-CM | POA: Diagnosis not present

## 2022-09-05 DIAGNOSIS — M5117 Intervertebral disc disorders with radiculopathy, lumbosacral region: Secondary | ICD-10-CM | POA: Diagnosis not present

## 2022-09-10 IMAGING — CR DG CHEST 2V
2 series · 2 of 2 positions shown · non-contrast
Comparison: None.

CLINICAL DATA: Cough, fever

EXAM:
CHEST - 2 VIEW

[chest pa]
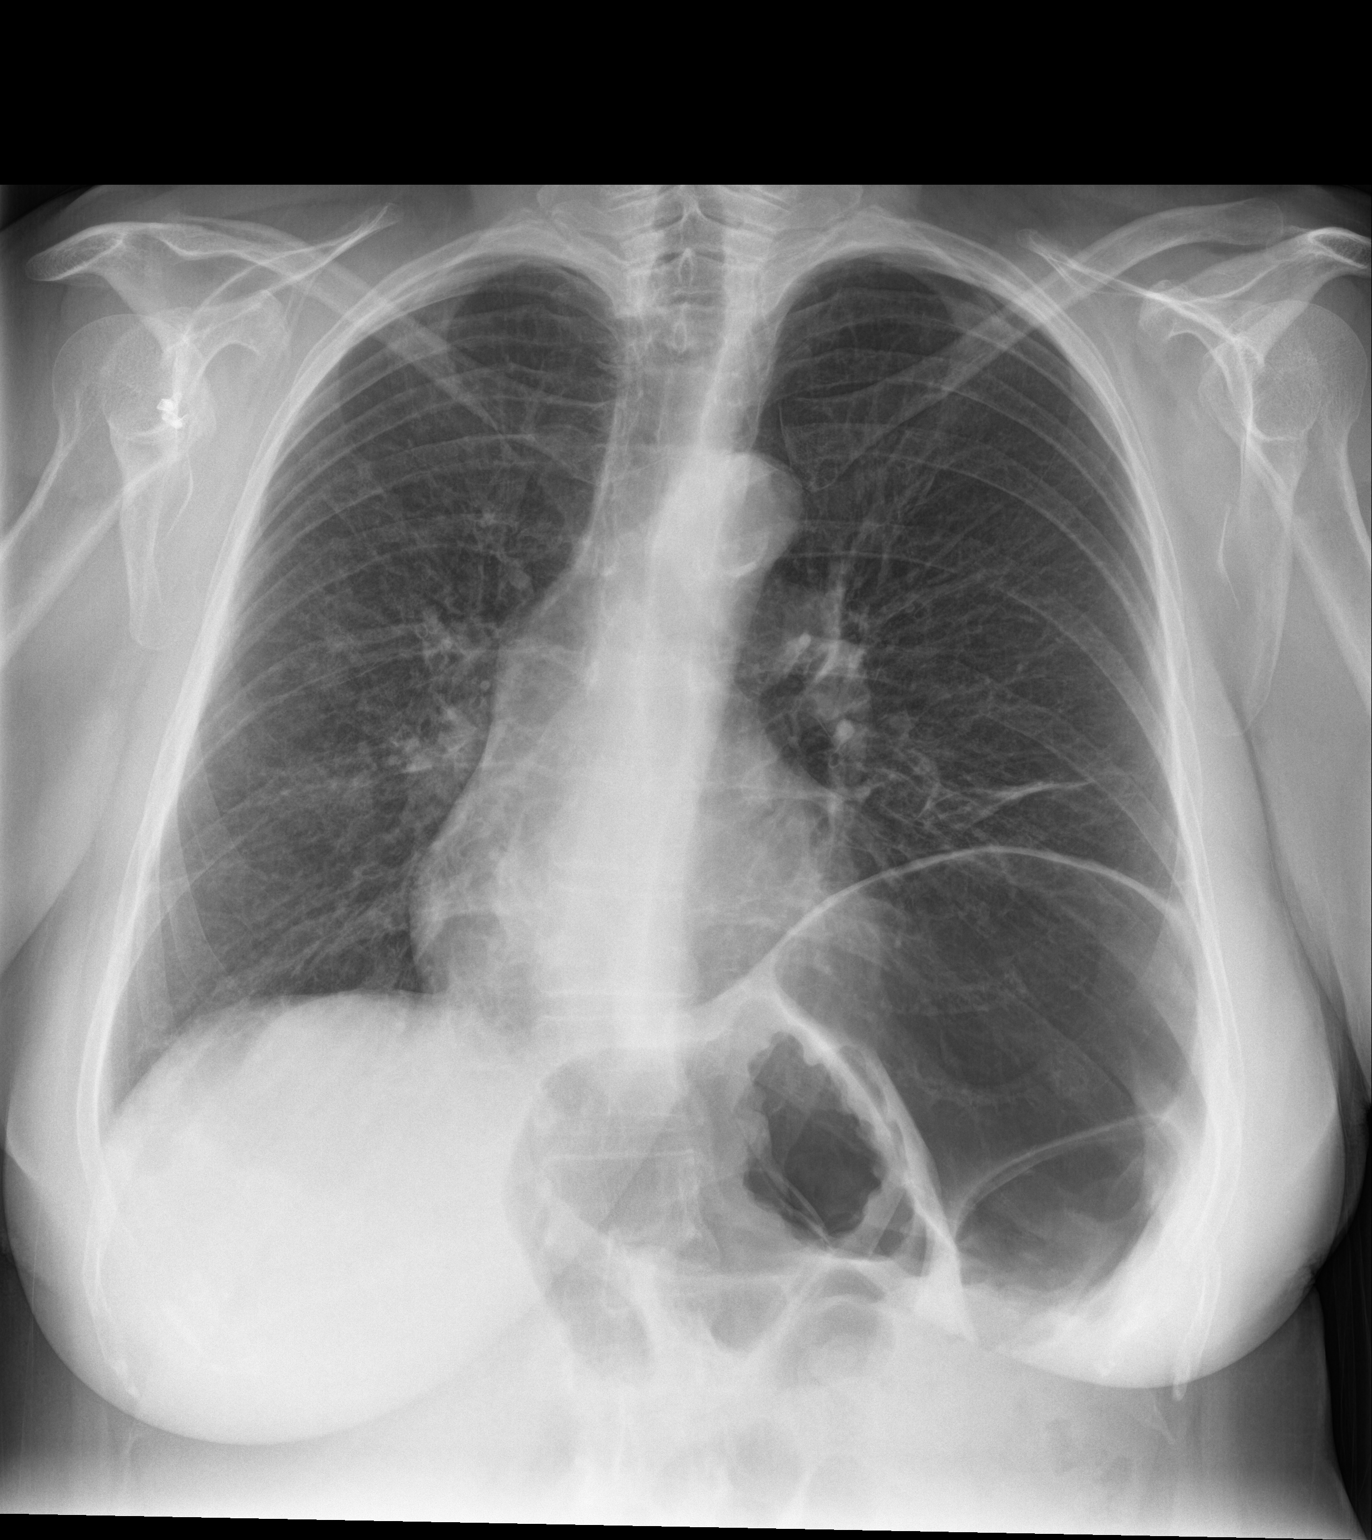

[chest lat]
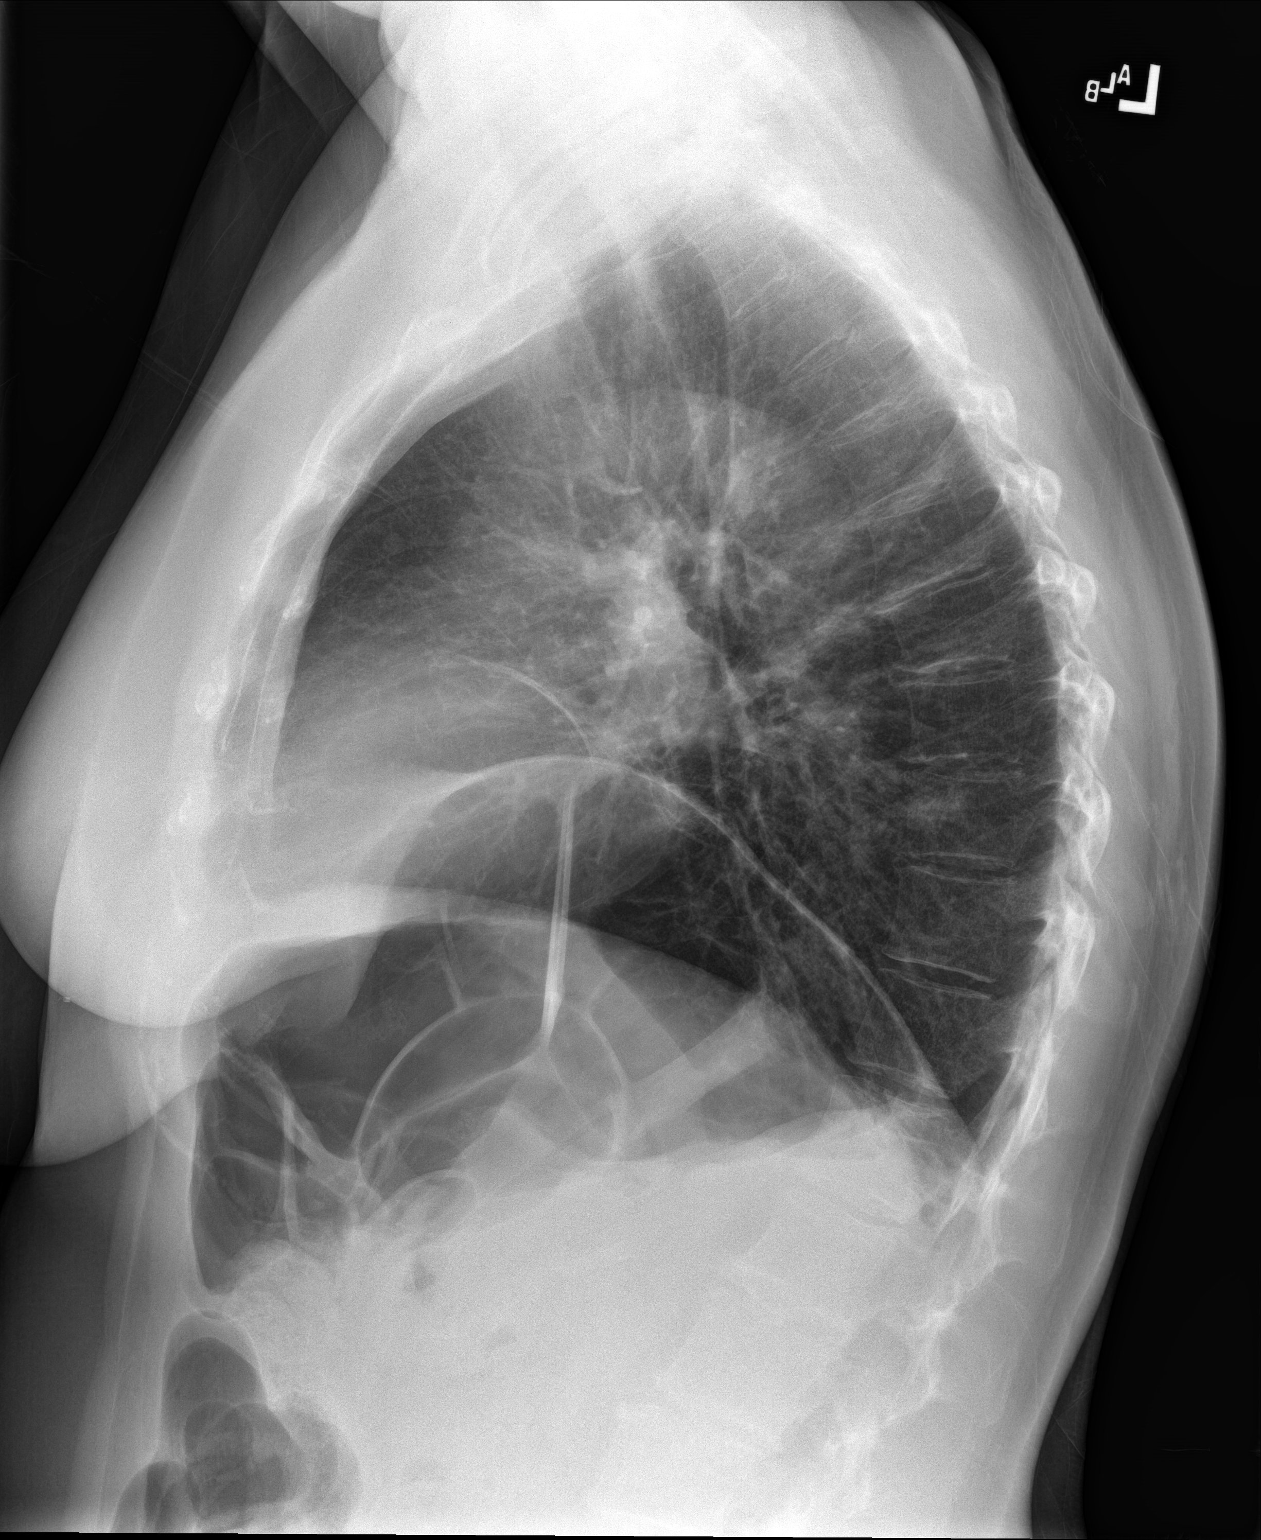

[2 of 2 positions shown; findings below may reference images not displayed]

FINDINGS: Left basilar scarring. Right lung clear. Heart is normal size. No
effusions or acute bony abnormality. Gaseous distention of the colon
at the splenic flexure, stable since prior study.
IMPRESSION: No active cardiopulmonary disease.

## 2022-09-11 DIAGNOSIS — M5117 Intervertebral disc disorders with radiculopathy, lumbosacral region: Secondary | ICD-10-CM | POA: Diagnosis not present

## 2022-09-11 DIAGNOSIS — M25552 Pain in left hip: Secondary | ICD-10-CM | POA: Diagnosis not present

## 2022-09-13 DIAGNOSIS — M25552 Pain in left hip: Secondary | ICD-10-CM | POA: Diagnosis not present

## 2022-09-13 DIAGNOSIS — M5117 Intervertebral disc disorders with radiculopathy, lumbosacral region: Secondary | ICD-10-CM | POA: Diagnosis not present

## 2022-09-14 DIAGNOSIS — M5416 Radiculopathy, lumbar region: Secondary | ICD-10-CM | POA: Diagnosis not present

## 2022-09-18 DIAGNOSIS — M5117 Intervertebral disc disorders with radiculopathy, lumbosacral region: Secondary | ICD-10-CM | POA: Diagnosis not present

## 2022-09-18 DIAGNOSIS — M25552 Pain in left hip: Secondary | ICD-10-CM | POA: Diagnosis not present

## 2022-09-18 DIAGNOSIS — M5416 Radiculopathy, lumbar region: Secondary | ICD-10-CM | POA: Diagnosis not present

## 2022-09-20 DIAGNOSIS — M25552 Pain in left hip: Secondary | ICD-10-CM | POA: Diagnosis not present

## 2022-09-20 DIAGNOSIS — M5117 Intervertebral disc disorders with radiculopathy, lumbosacral region: Secondary | ICD-10-CM | POA: Diagnosis not present

## 2022-09-28 DIAGNOSIS — M25552 Pain in left hip: Secondary | ICD-10-CM | POA: Diagnosis not present

## 2022-09-28 DIAGNOSIS — M5117 Intervertebral disc disorders with radiculopathy, lumbosacral region: Secondary | ICD-10-CM | POA: Diagnosis not present

## 2022-09-28 DIAGNOSIS — M5416 Radiculopathy, lumbar region: Secondary | ICD-10-CM | POA: Diagnosis not present

## 2022-10-02 DIAGNOSIS — M5117 Intervertebral disc disorders with radiculopathy, lumbosacral region: Secondary | ICD-10-CM | POA: Diagnosis not present

## 2022-10-02 DIAGNOSIS — M25552 Pain in left hip: Secondary | ICD-10-CM | POA: Diagnosis not present

## 2022-10-09 DIAGNOSIS — M25552 Pain in left hip: Secondary | ICD-10-CM | POA: Diagnosis not present

## 2022-10-09 DIAGNOSIS — M5117 Intervertebral disc disorders with radiculopathy, lumbosacral region: Secondary | ICD-10-CM | POA: Diagnosis not present

## 2022-10-22 DIAGNOSIS — M25552 Pain in left hip: Secondary | ICD-10-CM | POA: Diagnosis not present

## 2022-10-22 DIAGNOSIS — M5117 Intervertebral disc disorders with radiculopathy, lumbosacral region: Secondary | ICD-10-CM | POA: Diagnosis not present

## 2022-10-26 DIAGNOSIS — M25552 Pain in left hip: Secondary | ICD-10-CM | POA: Diagnosis not present

## 2022-10-26 DIAGNOSIS — M5117 Intervertebral disc disorders with radiculopathy, lumbosacral region: Secondary | ICD-10-CM | POA: Diagnosis not present

## 2022-10-31 DIAGNOSIS — M25552 Pain in left hip: Secondary | ICD-10-CM | POA: Diagnosis not present

## 2022-10-31 DIAGNOSIS — M5117 Intervertebral disc disorders with radiculopathy, lumbosacral region: Secondary | ICD-10-CM | POA: Diagnosis not present

## 2022-11-05 DIAGNOSIS — M7672 Peroneal tendinitis, left leg: Secondary | ICD-10-CM | POA: Diagnosis not present

## 2022-11-07 DIAGNOSIS — M5117 Intervertebral disc disorders with radiculopathy, lumbosacral region: Secondary | ICD-10-CM | POA: Diagnosis not present

## 2022-11-07 DIAGNOSIS — M25552 Pain in left hip: Secondary | ICD-10-CM | POA: Diagnosis not present

## 2022-11-19 DIAGNOSIS — M7672 Peroneal tendinitis, left leg: Secondary | ICD-10-CM | POA: Diagnosis not present

## 2022-11-28 DIAGNOSIS — M5459 Other low back pain: Secondary | ICD-10-CM | POA: Diagnosis not present

## 2022-11-28 DIAGNOSIS — M7672 Peroneal tendinitis, left leg: Secondary | ICD-10-CM | POA: Diagnosis not present

## 2022-12-03 DIAGNOSIS — M5459 Other low back pain: Secondary | ICD-10-CM | POA: Diagnosis not present

## 2022-12-03 DIAGNOSIS — M7672 Peroneal tendinitis, left leg: Secondary | ICD-10-CM | POA: Diagnosis not present

## 2022-12-10 DIAGNOSIS — M7672 Peroneal tendinitis, left leg: Secondary | ICD-10-CM | POA: Diagnosis not present

## 2022-12-10 DIAGNOSIS — M5459 Other low back pain: Secondary | ICD-10-CM | POA: Diagnosis not present

## 2023-01-08 ENCOUNTER — Ambulatory Visit (INDEPENDENT_AMBULATORY_CARE_PROVIDER_SITE_OTHER): Payer: Medicare HMO | Admitting: Family Medicine

## 2023-01-08 ENCOUNTER — Encounter: Payer: Self-pay | Admitting: Family Medicine

## 2023-01-08 VITALS — BP 106/70 | HR 66 | Temp 97.3°F | Ht 63.0 in | Wt 153.1 lb

## 2023-01-08 DIAGNOSIS — J029 Acute pharyngitis, unspecified: Secondary | ICD-10-CM | POA: Diagnosis not present

## 2023-01-08 DIAGNOSIS — J020 Streptococcal pharyngitis: Secondary | ICD-10-CM | POA: Diagnosis not present

## 2023-01-08 LAB — POCT RAPID STREP A (OFFICE): Rapid Strep A Screen: POSITIVE — AB

## 2023-01-08 MED ORDER — AMOXICILLIN 500 MG PO CAPS: 500.0000 mg | ORAL_CAPSULE | Freq: Two times a day (BID) | ORAL | 0 refills | Status: AC

## 2023-01-08 NOTE — Assessment & Plan Note (Signed)
Sore throat with close contacts to strep cases and pos strep test today  Reassuring exam Tx with amoxicillin  Disc symptom care ER precautions noted Update if not starting to improve in a week or if worsening   Handout given

## 2023-01-08 NOTE — Patient Instructions (Signed)
Drink lots of fluids Rest  Treat your symptoms  Take the amoxicillin as directed   Update if not starting to improve in a week or if worsening    If any severe symptoms like intense pain/unable to swallow go to the ER

## 2023-01-08 NOTE — Progress Notes (Signed)
Subjective:    Patient ID: Bridget [name], female    DOB: Apr 07, 1956, 67 y.o.   MRN: [id number]  HPI Pt presents with c/o sore throat   Wt Readings from Last 3 Encounters:  01/08/23 153 lb 2 oz (69.5 kg)  10/05/21 156 lb 3.2 oz (70.9 kg)  09/04/21 155 lb (70.3 kg)   27.12 kg/m   Vitals:   01/08/23 1604  BP: 106/70  Pulse: 66  Temp: (!) 97.3 F (36.3 C)  SpO2: 97%    Here for ST Was exp to strep   (grand kids and her daughter)    Scratchy throat yesterday  Worse ST last night  Eyes are irritated No fever / no body aches or chills   No headache  No rash   Otc Ibuprofen   Results for orders placed or performed in visit on 01/08/23  Rapid Strep A  Result Value Ref Range   Rapid Strep A Screen Positive (A) Negative     Patient Active Problem List   Diagnosis Date Noted   Sciatica 08/27/2022   Loud snoring 09/04/2021   Colon cancer screening 04/28/2021   Gluteal pain 10/13/2020   Mild hyperlipidemia 05/04/2018   Estrogen deficiency 04/30/2018   Pre-operative examination 02/21/2017   Low back pain 06/11/2016   Osteopenia 09/30/2013   Encounter for routine gynecological examination 01/18/2012   Routine general medical examination at a health care facility 01/09/2012   Strep throat 12/28/2011   FASCIITIS, PLANTAR 02/06/2010   DEGENERATIVE JOINT DISEASE, MILD 07/19/2009   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   Cancer ([place])    BASAL CELL   Chronic back pain    COVID-19 08/2021   GERD (gastroesophageal reflux disease)    OCC   Past Surgical History:  Procedure Laterality Date   ANKLE SURGERY Right    BASAL CELL CARCINOMA EXCISION  12/06/2017   forehead   BREAST BIOPSY Right 02/05/2018   BREAST SURGERY  01/25/2018   Breast biopsy   CESAREAN SECTION  [phone]   hodp pre term labor CS   COLONOSCOPY WITH PROPOFOL N/A 07/05/2021   Procedure: COLONOSCOPY WITH PROPOFOL;  Surgeon: Jonathon Bellows, MD;   Location: [facility] ENDOSCOPY;  Service: Gastroenterology;  Laterality: N/A;   OVARIAN CYST SURGERY  1973   SHOULDER ARTHROSCOPY Right 10/31/2017   Procedure: ARTHROSCOPY SHOULDER WITH LYSIS OF ADHESION, SUBACROMINAL DECOMPRESSION,DISTAL CLAVICLE EXCISION.;  Surgeon: Thornton Park, MD;  Location: [facility] ORS;  Service: Orthopedics;  Laterality: Right;  SHOULDER ARTHROSCOPY WITH LYSIS AND RESECTION OF ADHESIONS   SHOULDER ARTHROSCOPY WITH OPEN ROTATOR CUFF REPAIR AND DISTAL CLAVICLE ACROMINECTOMY Right 03/19/2017   Procedure: SHOULDER ARTHROSCOPY WITH OPEN ROTATOR CUFF REPAIR AND SUBACROMINAL DECOMPRESSION;  Surgeon: Thornton Park, MD;  Location: [facility] ORS;  Service: Orthopedics;  Laterality: Right;   TUBAL LIGATION  1999   WRIST GANGLION EXCISION     Social History   Tobacco Use   Smoking status: Never   Smokeless tobacco: Never  Vaping Use   Vaping Use: Never used  Substance Use Topics   Alcohol use: Yes    Alcohol/week: 4.0 standard drinks of alcohol    Types: 4 Glasses of wine per week    Comment: 2-3 nights per week   Drug use: No   Family History  Problem Relation Age of Onset   Arthritis Mother        OA with verterabral fx   Hypertension Mother  Fibrocystic breast disease Mother    Cancer Maternal Grandfather        colon CA   Diabetes Paternal Grandmother    Allergies  Allergen Reactions   Lorcet [Hydrocodone-Acetaminophen] Rash   Current Outpatient Medications on File Prior to Visit  Medication Sig Dispense Refill   Acetaminophen (TYLENOL 8 HOUR PO) Take by mouth.     Cholecalciferol 25 MCG (1000 UT) capsule Take 2,000 Units by mouth daily.      Docusate Calcium (STOOL SOFTENER PO) Take 2 tablets by mouth daily.     ibuprofen (ADVIL,MOTRIN) 200 MG tablet Take 400 mg by mouth 2 (two) times daily as needed for mild pain.      meloxicam (MOBIC) 7.5 MG tablet Take 1 tablet by mouth daily as needed.  1   Multiple Vitamin (MULTIVITAMIN) capsule Take 1 capsule by mouth  daily.     Polyethyl Glycol-Propyl Glycol (SYSTANE OP) Apply 2 drops to eye as needed (dry eyes).     Probiotic Product (PROBIOTIC ADVANCED PO) Take by mouth as needed.     No current facility-administered medications on file prior to visit.       Review of Systems  Constitutional:  Positive for appetite change and fatigue. Negative for activity change, fever and unexpected weight change.  HENT:  Positive for sore throat. Negative for congestion, ear pain, rhinorrhea, sinus pressure, trouble swallowing and voice change.   Eyes:  Negative for photophobia, pain, redness and visual disturbance.       Eyes are irritated  Respiratory:  Negative for cough, shortness of breath and wheezing.   Cardiovascular:  Negative for chest pain and palpitations.  Gastrointestinal:  Negative for abdominal pain, blood in stool, constipation and diarrhea.  Endocrine: Negative for polydipsia and polyuria.  Genitourinary:  Negative for dysuria, frequency and urgency.  Musculoskeletal:  Negative for arthralgias, back pain and myalgias.  Skin:  Negative for pallor and rash.  Allergic/Immunologic: Negative for environmental allergies.  Neurological:  Negative for dizziness, syncope and headaches.  Hematological:  Negative for adenopathy. Does not bruise/bleed easily.  Psychiatric/Behavioral:  Negative for decreased concentration and dysphoric mood. The patient is not nervous/anxious.        Objective:   Physical Exam Constitutional:      General: She is not in acute distress.    Appearance: She is well-developed and normal weight. She is not ill-appearing or diaphoretic.  HENT:     Head: Normocephalic and atraumatic.     Right Ear: Tympanic membrane and ear canal normal.     Left Ear: Tympanic membrane and ear canal normal.     Nose: No congestion.     Mouth/Throat:     Mouth: Mucous membranes are moist. No oral lesions.     Pharynx: Pharyngeal swelling and posterior oropharyngeal erythema present. No  oropharyngeal exudate or uvula swelling.     Tonsils: No tonsillar exudate or tonsillar abscesses. 0 on the right. 0 on the left.  Eyes:     Conjunctiva/sclera: Conjunctivae normal.  Cardiovascular:     Rate and Rhythm: Normal rate and regular rhythm.  Pulmonary:     Effort: Pulmonary effort is normal. No respiratory distress.     Breath sounds: Normal breath sounds. No wheezing or rales.  Lymphadenopathy:     Cervical: No cervical adenopathy.  Skin:    General: Skin is warm and dry.     Findings: No rash.  Neurological:     Mental Status: She is alert.  Cranial Nerves: No cranial nerve deficit.  Psychiatric:        Mood and Affect: Mood normal.           Assessment & Plan:   Problem List Items Addressed This Visit       Respiratory   Strep throat    Sore throat with close contacts to strep cases and pos strep test today  Reassuring exam Tx with amoxicillin  Disc symptom care ER precautions noted Update if not starting to improve in a week or if worsening   Handout given      Other Visit Diagnoses     Sore throat    -  Primary   Relevant Orders   Rapid Strep A (Completed)

## 2023-01-17 DIAGNOSIS — Z872 Personal history of diseases of the skin and subcutaneous tissue: Secondary | ICD-10-CM | POA: Diagnosis not present

## 2023-01-17 DIAGNOSIS — C44311 Basal cell carcinoma of skin of nose: Secondary | ICD-10-CM | POA: Diagnosis not present

## 2023-01-17 DIAGNOSIS — D2261 Melanocytic nevi of right upper limb, including shoulder: Secondary | ICD-10-CM | POA: Diagnosis not present

## 2023-01-17 DIAGNOSIS — D2271 Melanocytic nevi of right lower limb, including hip: Secondary | ICD-10-CM | POA: Diagnosis not present

## 2023-01-17 DIAGNOSIS — D485 Neoplasm of uncertain behavior of skin: Secondary | ICD-10-CM | POA: Diagnosis not present

## 2023-01-17 DIAGNOSIS — Z85828 Personal history of other malignant neoplasm of skin: Secondary | ICD-10-CM | POA: Diagnosis not present

## 2023-01-17 DIAGNOSIS — Z09 Encounter for follow-up examination after completed treatment for conditions other than malignant neoplasm: Secondary | ICD-10-CM | POA: Diagnosis not present

## 2023-01-17 DIAGNOSIS — D2262 Melanocytic nevi of left upper limb, including shoulder: Secondary | ICD-10-CM | POA: Diagnosis not present

## 2023-02-02 DIAGNOSIS — R69 Illness, unspecified: Secondary | ICD-10-CM | POA: Diagnosis not present

## 2023-03-11 DIAGNOSIS — C44311 Basal cell carcinoma of skin of nose: Secondary | ICD-10-CM | POA: Diagnosis not present

## 2023-03-11 DIAGNOSIS — L578 Other skin changes due to chronic exposure to nonionizing radiation: Secondary | ICD-10-CM | POA: Diagnosis not present

## 2023-03-11 DIAGNOSIS — Z85828 Personal history of other malignant neoplasm of skin: Secondary | ICD-10-CM | POA: Diagnosis not present

## 2023-03-11 DIAGNOSIS — L905 Scar conditions and fibrosis of skin: Secondary | ICD-10-CM | POA: Diagnosis not present

## 2023-03-14 ENCOUNTER — Encounter: Payer: Self-pay | Admitting: Family Medicine

## 2023-03-14 ENCOUNTER — Ambulatory Visit (INDEPENDENT_AMBULATORY_CARE_PROVIDER_SITE_OTHER): Payer: Medicare HMO | Admitting: Family Medicine

## 2023-03-14 VITALS — BP 128/68 | HR 82 | Temp 97.4°F | Ht 63.0 in | Wt 151.2 lb

## 2023-03-14 DIAGNOSIS — J020 Streptococcal pharyngitis: Secondary | ICD-10-CM | POA: Diagnosis not present

## 2023-03-14 DIAGNOSIS — J029 Acute pharyngitis, unspecified: Secondary | ICD-10-CM

## 2023-03-14 LAB — POCT RAPID STREP A (OFFICE): Rapid Strep A Screen: POSITIVE — AB

## 2023-03-14 MED ORDER — AMOXICILLIN 500 MG PO CAPS: 500.0000 mg | ORAL_CAPSULE | Freq: Two times a day (BID) | ORAL | 0 refills | Status: AC

## 2023-03-14 NOTE — Assessment & Plan Note (Signed)
Rapid strep test today with ST some headache and chills (? If had fever)  Taking acetamin/ ibup for recent skin cancer surgery so unsure if fever  Was exp to family who gets recurrent strep She herself had strep throat in jan- tx with amox and totally resolved  Scant cough today=  ? If related  Reassuring exam - some mild throat erythema / no exudate or swelling   Will tx with amox 500 bid for 10 d  Plan to re check a rapid strep test on her in the future when she has no ST to see if she is a carrier   She suspects daughter is a carrier   Update if not starting to improve in a week or if worsening   Disc symptom care-see AVS Disc ER precautions

## 2023-03-14 NOTE — Patient Instructions (Signed)
Drink lots of fluids   Take the amoxicillin as directed  If symptoms do not improve or if they worsen let us know    Take tylenol/ibuprofen for symptoms     Some day in the future when you have no symptoms we can check a strep test

## 2023-03-14 NOTE — Progress Notes (Signed)
Subjective:    Patient ID: Bridget [name], female    DOB: 05-06-1956, 67 y.o.   MRN: [id number]  HPI Pt presents with c/o sore throat   Wt Readings from Last 3 Encounters:  03/14/23 151 lb 4 oz (68.6 kg)  01/08/23 153 lb 2 oz (69.5 kg)  10/05/21 156 lb 3.2 oz (70.9 kg)   26.79 kg/m  Vitals:   03/14/23 1022  BP: 128/68  Pulse: 82  Temp: (!) 97.4 F (36.3 C)  SpO2: 97%     She was treated for strep throat on 01/08/23 with amoxicillin 500 mg bid for 10 d  Got this from daughter and family who had strep  Then her daughter got it 2 more times - (whole family has it and some are asympt)   Last saw family last week  Traveled with them   Exposed on Thursday - maybe  Started symptoms on Friday   Right now  Has symptoms    Friday - little sore throat  Worse on Saturday - signif uncomfortable and hurt to swallow  Few chills sat night   Then Sunday milder sore throat   Basal cell Moh's on Monday  No abx with that  It went well   Today  Mild ST Throat feels swollen   Low grade cough started 2-3 d ago  Dry cough  Hard to tell if nasal symptoms - perhaps just a little  Ears are ok   Last had chills 2 d ago  Some body aches yesterday   No rash  Has had some headaches   Unsure if temp- she takes tylenol and ibuprofen atc for her post op recovery     Results for orders placed or performed in visit on 03/14/23  Rapid Strep A  Result Value Ref Range   Rapid Strep A Screen Positive (A) Negative    Patient Active Problem List   Diagnosis Date Noted   Sciatica 08/27/2022   Loud snoring 09/04/2021   Colon cancer screening 04/28/2021   Gluteal pain 10/13/2020   Mild hyperlipidemia 05/04/2018   Estrogen deficiency 04/30/2018   Pre-operative examination 02/21/2017   Low back pain 06/11/2016   Osteopenia 09/30/2013   Encounter for routine gynecological examination 01/18/2012   Routine general medical examination at a health care facility  01/09/2012   Strep throat 12/28/2011   FASCIITIS, PLANTAR 02/06/2010   DEGENERATIVE JOINT DISEASE, MILD 07/19/2009   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   Cancer ([place])    BASAL CELL   Chronic back pain    COVID-19 08/2021   GERD (gastroesophageal reflux disease)    OCC   Past Surgical History:  Procedure Laterality Date   ANKLE SURGERY Right    BASAL CELL CARCINOMA EXCISION  12/06/2017   forehead   BREAST BIOPSY Right 02/05/2018   BREAST SURGERY  01/25/2018   Breast biopsy   CESAREAN SECTION  [id number]   hodp pre term labor CS   COLONOSCOPY WITH PROPOFOL N/A 07/05/2021   Procedure: COLONOSCOPY WITH PROPOFOL;  Surgeon: Jonathon Bellows, MD;  Location: [facility] ENDOSCOPY;  Service: Gastroenterology;  Laterality: N/A;   OVARIAN CYST SURGERY  1973   SHOULDER ARTHROSCOPY Right 10/31/2017   Procedure: ARTHROSCOPY SHOULDER WITH LYSIS OF ADHESION, SUBACROMINAL DECOMPRESSION,DISTAL CLAVICLE EXCISION.;  Surgeon: Thornton Park, MD;  Location: [facility] ORS;  Service: Orthopedics;  Laterality: Right;  SHOULDER ARTHROSCOPY WITH LYSIS AND RESECTION OF ADHESIONS   SHOULDER ARTHROSCOPY WITH  OPEN ROTATOR CUFF REPAIR AND DISTAL CLAVICLE ACROMINECTOMY Right 03/19/2017   Procedure: SHOULDER ARTHROSCOPY WITH OPEN ROTATOR CUFF REPAIR AND SUBACROMINAL DECOMPRESSION;  Surgeon: Thornton Park, MD;  Location: [facility] ORS;  Service: Orthopedics;  Laterality: Right;   TUBAL LIGATION  1999   WRIST GANGLION EXCISION     Social History   Tobacco Use   Smoking status: Never   Smokeless tobacco: Never  Vaping Use   Vaping Use: Never used  Substance Use Topics   Alcohol use: Yes    Alcohol/week: 4.0 standard drinks of alcohol    Types: 4 Glasses of wine per week    Comment: 2-3 nights per week   Drug use: No   Family History  Problem Relation Age of Onset   Arthritis Mother        OA with verterabral fx   Hypertension Mother    Fibrocystic breast disease Mother     Cancer Maternal Grandfather        colon CA   Diabetes Paternal Grandmother    Allergies  Allergen Reactions   Lorcet [Hydrocodone-Acetaminophen] Rash   Current Outpatient Medications on File Prior to Visit  Medication Sig Dispense Refill   Acetaminophen (TYLENOL 8 HOUR PO) Take by mouth.     Cholecalciferol 25 MCG (1000 UT) capsule Take 2,000 Units by mouth daily.      Docusate Calcium (STOOL SOFTENER PO) Take 2 tablets by mouth daily.     ibuprofen (ADVIL,MOTRIN) 200 MG tablet Take 400 mg by mouth 2 (two) times daily as needed for mild pain.      meloxicam (MOBIC) 7.5 MG tablet Take 1 tablet by mouth daily as needed.  1   Multiple Vitamin (MULTIVITAMIN) capsule Take 1 capsule by mouth daily.     Polyethyl Glycol-Propyl Glycol (SYSTANE OP) Apply 2 drops to eye as needed (dry eyes).     Probiotic Product (PROBIOTIC ADVANCED PO) Take by mouth as needed.     No current facility-administered medications on file prior to visit.    Review of Systems  Constitutional:  Positive for chills. Negative for activity change, appetite change, fatigue, fever and unexpected weight change.  HENT:  Positive for sore throat. Negative for congestion, ear pain, rhinorrhea and sinus pressure.   Eyes:  Negative for pain, redness and visual disturbance.  Respiratory:  Positive for cough. Negative for shortness of breath and wheezing.        Mild cough  Cardiovascular:  Negative for chest pain and palpitations.  Gastrointestinal:  Negative for abdominal pain, blood in stool, constipation and diarrhea.  Endocrine: Negative for polydipsia and polyuria.  Genitourinary:  Negative for dysuria, frequency and urgency.  Musculoskeletal:  Negative for arthralgias, back pain and myalgias.  Skin:  Negative for pallor and rash.  Allergic/Immunologic: Negative for environmental allergies.  Neurological:  Positive for headaches. Negative for dizziness and syncope.  Hematological:  Negative for adenopathy. Does not  bruise/bleed easily.  Psychiatric/Behavioral:  Negative for decreased concentration and dysphoric mood. The patient is not nervous/anxious.        Objective:   Physical Exam Constitutional:      General: She is not in acute distress.    Appearance: She is well-developed and normal weight. She is not ill-appearing or diaphoretic.  HENT:     Head: Normocephalic and atraumatic.     Nose:     Comments: Bandage on nose from Moh's surgery    Mouth/Throat:     Mouth: Mucous membranes are moist.  Pharynx: Posterior oropharyngeal erythema present. No oropharyngeal exudate.     Comments: Mild throat erythema  No swelling  No exudate Eyes:     Conjunctiva/sclera: Conjunctivae normal.     Pupils: Pupils are equal, round, and reactive to light.  Neck:     Thyroid: No thyromegaly.     Vascular: No carotid bruit or JVD.  Cardiovascular:     Rate and Rhythm: Normal rate and regular rhythm.     Heart sounds: Normal heart sounds.     No gallop.  Pulmonary:     Effort: Pulmonary effort is normal. No respiratory distress.     Breath sounds: Normal breath sounds. No wheezing or rales.  Abdominal:     General: There is no distension or abdominal bruit.     Palpations: Abdomen is soft.  Musculoskeletal:     Cervical back: Normal range of motion and neck supple.     Right lower leg: No edema.     Left lower leg: No edema.  Lymphadenopathy:     Cervical: No cervical adenopathy.  Skin:    General: Skin is warm and dry.     Coloration: Skin is not pale.     Findings: No rash.  Neurological:     Mental Status: She is alert.     Coordination: Coordination normal.     Deep Tendon Reflexes: Reflexes are normal and symmetric. Reflexes normal.  Psychiatric:        Mood and Affect: Mood normal.           Assessment & Plan:   Problem List Items Addressed This Visit       Respiratory   Strep throat    Rapid strep test today with ST some headache and chills (? If had fever)  Taking  acetamin/ ibup for recent skin cancer surgery so unsure if fever  Was exp to family who gets recurrent strep She herself had strep throat in jan- tx with amox and totally resolved  Scant cough today=  ? If related  Reassuring exam - some mild throat erythema / no exudate or swelling   Will tx with amox 500 bid for 10 d  Plan to re check a rapid strep test on her in the future when she has no ST to see if she is a carrier   She suspects daughter is a carrier   Update if not starting to improve in a week or if worsening   Disc symptom care-see AVS Disc ER precautions       Other Visit Diagnoses     Sore throat    -  Primary   Relevant Orders   Rapid Strep A (Completed)

## 2023-03-26 ENCOUNTER — Telehealth: Payer: Self-pay | Admitting: Family Medicine

## 2023-03-26 ENCOUNTER — Telehealth: Payer: Self-pay

## 2023-03-26 NOTE — Telephone Encounter (Signed)
Contacted Bridget [name] to schedule their annual wellness visit. Appointment made for 04/16/2023.  [place] Direct Dial: [phone]

## 2023-03-26 NOTE — Telephone Encounter (Signed)
Patient need to have appointment with health coach set up before CPE with provider. Not sure who to send to to have rescheduled.

## 2023-03-27 NOTE — Telephone Encounter (Signed)
Updated.

## 2023-04-02 ENCOUNTER — Telehealth: Payer: Self-pay | Admitting: Family Medicine

## 2023-04-02 DIAGNOSIS — R7303 Prediabetes: Secondary | ICD-10-CM | POA: Insufficient documentation

## 2023-04-02 DIAGNOSIS — Z131 Encounter for screening for diabetes mellitus: Secondary | ICD-10-CM

## 2023-04-02 DIAGNOSIS — E785 Hyperlipidemia, unspecified: Secondary | ICD-10-CM

## 2023-04-02 DIAGNOSIS — Z0000 Encounter for general adult medical examination without abnormal findings: Secondary | ICD-10-CM

## 2023-04-02 NOTE — Telephone Encounter (Signed)
-----   Message from Alvina Chou sent at 03/27/2023 10:46 AM EDT ----- Regarding: Lab orders for Friday, 4.12.24 Patient is scheduled for CPX labs, please order future labs, Thanks , Camelia Eng

## 2023-04-05 ENCOUNTER — Other Ambulatory Visit: Payer: Medicare HMO

## 2023-04-09 ENCOUNTER — Encounter: Payer: Medicare HMO | Admitting: Family Medicine

## 2023-04-11 ENCOUNTER — Other Ambulatory Visit (INDEPENDENT_AMBULATORY_CARE_PROVIDER_SITE_OTHER): Payer: Medicare HMO

## 2023-04-11 DIAGNOSIS — E785 Hyperlipidemia, unspecified: Secondary | ICD-10-CM | POA: Diagnosis not present

## 2023-04-11 DIAGNOSIS — Z131 Encounter for screening for diabetes mellitus: Secondary | ICD-10-CM

## 2023-04-11 DIAGNOSIS — Z0000 Encounter for general adult medical examination without abnormal findings: Secondary | ICD-10-CM | POA: Diagnosis not present

## 2023-04-11 LAB — LIPID PANEL
Cholesterol: 189 mg/dL (ref 0–200)
HDL: 55.5 mg/dL (ref >39.00)
LDL Cholesterol: 117 mg/dL — ABNORMAL HIGH (ref 0–99)
NonHDL: 133.75
Total CHOL/HDL Ratio: 3
Triglycerides: 84 mg/dL (ref 0.0–149.0)
VLDL: 16.8 mg/dL (ref 0.0–40.0)

## 2023-04-11 LAB — CBC WITH DIFFERENTIAL/PLATELET
Basophils Absolute: 0.1 10*3/uL (ref 0.0–0.1)
Basophils Relative: 1.1 % (ref 0.0–3.0)
Eosinophils Absolute: 0.4 10*3/uL (ref 0.0–0.7)
Eosinophils Relative: 6.5 % — ABNORMAL HIGH (ref 0.0–5.0)
HCT: 40 % (ref 36.0–46.0)
Hemoglobin: 13.3 g/dL (ref 12.0–15.0)
Lymphocytes Relative: 22.2 % (ref 12.0–46.0)
Lymphs Abs: 1.2 10*3/uL (ref 0.7–4.0)
MCHC: 33.2 g/dL (ref 30.0–36.0)
MCV: 88.2 fl (ref 78.0–100.0)
Monocytes Absolute: 0.4 10*3/uL (ref 0.1–1.0)
Monocytes Relative: 7.9 % (ref 3.0–12.0)
Neutro Abs: 3.4 10*3/uL (ref 1.4–7.7)
Neutrophils Relative %: 62.3 % (ref 43.0–77.0)
Platelets: 290 10*3/uL (ref 150.0–400.0)
RBC: 4.53 Mil/uL (ref 3.87–5.11)
RDW: 14.4 % (ref 11.5–15.5)
WBC: 5.5 10*3/uL (ref 4.0–10.5)

## 2023-04-11 LAB — COMPREHENSIVE METABOLIC PANEL
ALT: 10 U/L (ref 0–35)
AST: 16 U/L (ref 0–37)
Albumin: 4.1 g/dL (ref 3.5–5.2)
Alkaline Phosphatase: 68 U/L (ref 39–117)
BUN: 18 mg/dL (ref 6–23)
CO2: 28 mEq/L (ref 19–32)
Calcium: 9 mg/dL (ref 8.4–10.5)
Chloride: 106 mEq/L (ref 96–112)
Creatinine, Ser: 0.66 mg/dL (ref 0.40–1.20)
GFR: 91.25 mL/min (ref >60.00)
Glucose, Bld: 89 mg/dL (ref 70–99)
Potassium: 4.2 mEq/L (ref 3.5–5.1)
Sodium: 141 mEq/L (ref 135–145)
Total Bilirubin: 0.5 mg/dL (ref 0.2–1.2)
Total Protein: 6.4 g/dL (ref 6.0–8.3)

## 2023-04-11 LAB — TSH: TSH: 1.66 u[IU]/mL (ref 0.35–5.50)

## 2023-04-11 LAB — HEMOGLOBIN A1C: Hgb A1c MFr Bld: 6 % (ref 4.6–6.5)

## 2023-04-15 DIAGNOSIS — M7672 Peroneal tendinitis, left leg: Secondary | ICD-10-CM | POA: Diagnosis not present

## 2023-04-16 ENCOUNTER — Ambulatory Visit (INDEPENDENT_AMBULATORY_CARE_PROVIDER_SITE_OTHER): Payer: Medicare HMO

## 2023-04-16 VITALS — Ht 63.0 in | Wt 149.0 lb

## 2023-04-16 DIAGNOSIS — Z0000 Encounter for general adult medical examination without abnormal findings: Secondary | ICD-10-CM

## 2023-04-16 NOTE — Progress Notes (Signed)
I connected with  Leta Baptist on 04/16/23 by a audio enabled telemedicine application and verified that I am speaking with the correct person using two identifiers.  Patient Location: Home  Provider Location: Home Office  I discussed the limitations of evaluation and management by telemedicine. The patient expressed understanding and agreed to proceed.  Subjective:   Bridget [name] is a 67 y.o. female who presents for an Initial Medicare Annual Wellness Visit.  Review of Systems      Cardiac Risk Factors include: advanced age (>86men, >37 women)     Objective:    Today's Vitals   04/16/23 1130  Weight: 149 lb (67.6 kg)  Height: 5\' 3"  (1.6 m)  PainSc: 2    Body mass index is 26.39 kg/m.     04/16/2023   11:38 AM 07/20/2021   10:47 AM 07/05/2021   11:15 AM 07/04/2021    6:57 AM 10/20/2020    5:52 PM 10/31/2017    7:15 AM 10/18/2017    4:02 PM  Advanced Directives  Does Patient Have a Medical Advance Directive? Yes No Yes Yes Yes Yes Yes  Type of Estate agent of [place];Living will   Healthcare Power of [place];Living will  Healthcare Power of [place];Living will   Does patient want to make changes to medical advance directive?     No - Patient declined    Copy of Healthcare Power of Attorney in Chart? No - copy requested     Yes     Current Medications (verified) Outpatient Encounter Medications as of 04/16/2023  Medication Sig   Acetaminophen (TYLENOL 8 HOUR PO) Take by mouth.   Cholecalciferol 25 MCG (1000 UT) capsule Take 2,000 Units by mouth daily.    Docusate Calcium (STOOL SOFTENER PO) Take 2 tablets by mouth daily.   ibuprofen (ADVIL,MOTRIN) 200 MG tablet Take 400 mg by mouth 2 (two) times daily as needed for mild pain.    meloxicam (MOBIC) 7.5 MG tablet Take 1 tablet by mouth daily as needed.   Multiple Vitamin (MULTIVITAMIN) capsule Take 1 capsule by mouth daily.   Polyethyl Glycol-Propyl Glycol (SYSTANE OP)  Apply 2 drops to eye as needed (dry eyes).   Probiotic Product (PROBIOTIC ADVANCED PO) Take by mouth as needed.   No facility-administered encounter medications on file as of 04/16/2023.    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   Cancer    BASAL CELL   Chronic back pain    COVID-19 08/2021   GERD (gastroesophageal reflux disease)    OCC   Past Surgical History:  Procedure Laterality Date   ANKLE SURGERY Right    BASAL CELL CARCINOMA EXCISION  12/06/2017   forehead   BREAST BIOPSY Right 02/05/2018   BREAST SURGERY  01/25/2018   Breast biopsy   CESAREAN SECTION  [phone]   hodp pre term labor CS   COLONOSCOPY WITH PROPOFOL N/A 07/05/2021   Procedure: COLONOSCOPY WITH PROPOFOL;  Surgeon: Wyline Mood, MD;  Location: [facility] ENDOSCOPY;  Service: Gastroenterology;  Laterality: N/A;   MOHS SURGERY     02/2023   OVARIAN CYST SURGERY  1973   SHOULDER ARTHROSCOPY Right 10/31/2017   Procedure: ARTHROSCOPY SHOULDER WITH LYSIS OF ADHESION, SUBACROMINAL DECOMPRESSION,DISTAL CLAVICLE EXCISION.;  Surgeon: Juanell Fairly, MD;  Location: [facility] ORS;  Service: Orthopedics;  Laterality: Right;  SHOULDER ARTHROSCOPY WITH LYSIS AND RESECTION OF ADHESIONS   SHOULDER ARTHROSCOPY  WITH OPEN ROTATOR CUFF REPAIR AND DISTAL CLAVICLE ACROMINECTOMY Right 03/19/2017   Procedure: SHOULDER ARTHROSCOPY WITH OPEN ROTATOR CUFF REPAIR AND SUBACROMINAL DECOMPRESSION;  Surgeon: Juanell Fairly, MD;  Location: [facility] ORS;  Service: Orthopedics;  Laterality: Right;   TUBAL LIGATION  1999   WRIST GANGLION EXCISION     Family History  Problem Relation Age of Onset   Arthritis Mother        OA with verterabral fx   Hypertension Mother    Fibrocystic breast disease Mother    Cancer Maternal Grandfather        colon CA   Diabetes Paternal Grandmother    Social History   Socioeconomic History   Marital  status: Married    Spouse name: Not on file   Number of children: Not on file   Years of education: Not on file   Highest education level: Not on file  Occupational History   Not on file  Tobacco Use   Smoking status: Never   Smokeless tobacco: Never  Vaping Use   Vaping Use: Never used  Substance and Sexual Activity   Alcohol use: Yes    Alcohol/week: 4.0 standard drinks of alcohol    Types: 4 Glasses of wine per week    Comment: 2-3 nights per week   Drug use: No   Sexual activity: Not on file  Other Topics Concern   Not on file  Social History Narrative   Not on file   Social Determinants of Health   Financial Resource Strain: Low Risk  (04/16/2023)   Overall Financial Resource Strain (CARDIA)    Difficulty of Paying Living Expenses: Not hard at all  Food Insecurity: No Food Insecurity (04/16/2023)   Hunger Vital Sign    Worried About Running Out of Food in the Last Year: Never true    Ran Out of Food in the Last Year: Never true  Transportation Needs: No Transportation Needs (04/16/2023)   PRAPARE - Administrator, Civil Service (Medical): No    Lack of Transportation (Non-Medical): No  Physical Activity: Sufficiently Active (04/16/2023)   Exercise Vital Sign    Days of Exercise per Week: 2 days    Minutes of Exercise per Session: 120 min  Stress: Stress Concern Present (04/16/2023)   [employer] of Occupational Health - Occupational Stress Questionnaire    Feeling of Stress : Very much  Social Connections: Socially Integrated (04/16/2023)   Social Connection and Isolation Panel [NHANES]    Frequency of Communication with Friends and Family: More than three times a week    Frequency of Social Gatherings with Friends and Family: More than three times a week    Attends Religious Services: More than 4 times per year    Active Member of [employer] or Organizations: Yes    Attends Engineer, structural: More than 4 times per year    Marital Status:  Married    Tobacco Counseling Counseling given: Not Answered   Clinical Intake:  Pre-visit preparation completed: Yes  Pain : 0-10 Pain Score: 2  Pain Type: Acute pain Pain Location: Ankle (followed by orthopedic) Pain Descriptors / Indicators: Nagging Pain Onset: More than a month ago     Nutritional Risks: None Diabetes: No  How often do you need to have someone help you when you read instructions, pamphlets, or other written materials from your doctor or pharmacy?: 1 - Never  Diabetic? no  Interpreter Needed?: No  Information entered by :: C.Araya Roel LPN  Activities of Daily Living    04/16/2023   11:40 AM  In your present state of health, do you have any difficulty performing the following activities:  Hearing? 0  Vision? 0  Difficulty concentrating or making decisions? 0  Walking or climbing stairs? 0  Dressing or bathing? 0  Doing errands, shopping? 0  Preparing Food and eating ? N  Using the Toilet? N  In the past six months, have you accidently leaked urine? N  Do you have problems with loss of bowel control? N  Managing your Medications? N  Managing your Finances? N  Housekeeping or managing your Housekeeping? N    Patient Care Team: Tower, Audrie Gallus, MD as PCP - General (Family Medicine)  Indicate any recent Medical Services you may have received from other than [facility] providers in the past year (date may be approximate).     Assessment:   This is a routine wellness examination for [place].  Hearing/Vision screen Hearing Screening - Comments:: No aids Vision Screening - Comments:: Contacts -  Eye  Dietary issues and exercise activities discussed: Current Exercise Habits: Home exercise routine, Type of exercise: Other - see comments;walking (pickleball), Time (Minutes): > 60, Intensity: Moderate, Exercise limited by: None identified   Goals Addressed             This Visit's Progress    Patient Stated       Be more active.        Depression Screen    04/16/2023   11:37 AM 03/14/2023   10:34 AM 01/08/2023    4:14 PM 09/04/2021    2:06 PM 08/05/2019   10:10 AM 04/30/2018   10:40 AM 04/27/2016    9:32 AM  PHQ 2/9 Scores  PHQ - 2 Score 0 0 0 0 0 0 0  PHQ- 9 Score 0 3 0 0 1      Fall Risk    04/16/2023   11:39 AM 03/14/2023   10:34 AM 01/08/2023    4:14 PM 09/04/2021    2:06 PM 04/27/2016    9:32 AM  Fall Risk   Falls in the past year? 0 1 0 0 No  Number falls in past yr: 0 1 0 0   Comment tripped playing pickleball      Injury with Fall? 1 0 0 0   Comment brusies      Risk for fall due to : No Fall Risks History of fall(s) No Fall Risks    Follow up Falls prevention discussed;Falls evaluation completed Falls evaluation completed Falls evaluation completed      FALL RISK PREVENTION PERTAINING TO THE HOME:  Any stairs in or around the home? Yes  If so, are there any without handrails? No  Home free of loose throw rugs in walkways, pet beds, electrical cords, etc? Yes  Adequate lighting in your home to reduce risk of falls? Yes   ASSISTIVE DEVICES UTILIZED TO PREVENT FALLS:  Life alert? No  Use of a cane, walker or w/c? No  Grab bars in the bathroom? No  Shower chair or bench in shower? Yes  Elevated toilet seat or a handicapped toilet? Yes    Cognitive Function:        04/16/2023   11:41 AM  6CIT Screen  What Year? 0 points  What month? 0 points  What time? 0 points  Count back from 20 0 points  Months in reverse 0 points  Repeat phrase 0 points  Total Score 0  points    Immunizations Immunization History  Administered Date(s) Administered   Influenza Split 10/23/2011   Influenza,inj,Quad PF,6+ Mos 10/07/2016   Influenza-Unspecified 09/25/2015, 09/17/2017, 08/25/2018   PFIZER(Purple Top)SARS-COV-2 Vaccination 12/31/2019, 01/19/2020   PNEUMOCOCCAL CONJUGATE-20 09/04/2021   Td 08/12/2006   Tdap 05/07/2016   Zoster Recombinat (Shingrix) 09/30/2019, 02/16/2020    TDAP status: Up to  date  Flu Vaccine status: Up to date  Pneumococcal vaccine status: Up to date  Covid-19 vaccine status: Information provided on how to obtain vaccines.   Qualifies for Shingles Vaccine? Yes   Zostavax completed No   Shingrix Completed?: Yes  Screening Tests Health Maintenance  Topic Date Due   COVID-19 Vaccine (3 - Pfizer risk series) 02/16/2020   MAMMOGRAM  03/09/2023   Hepatitis C Screening  01/26/2024 (Originally 08/11/1974)   INFLUENZA VACCINE  07/25/2023   Medicare Annual Wellness (AWV)  04/15/2024   DTaP/Tdap/Td (3 - Td or Tdap) 05/07/2026   COLONOSCOPY (Pts 45-60yrs Insurance coverage will need to be confirmed)  07/06/2031   Pneumonia Vaccine 47+ Years old  Completed   DEXA SCAN  Completed   Zoster Vaccines- Shingrix  Completed   HPV VACCINES  Aged Out    Health Maintenance  Health Maintenance Due  Topic Date Due   COVID-19 Vaccine (3 - Pfizer risk series) 02/16/2020   MAMMOGRAM  03/09/2023    Colorectal cancer screening: Type of screening: Colonoscopy. Completed 07/05/21. Repeat every 10 years  Mammogram status: Completed 03/08/2022. Repeat every year  Bone Density status: Completed 03/08/22. Results reflect: Bone density results: OSTEOPENIA. Repeat every 2 years.  Lung Cancer Screening: (Low Dose CT Chest recommended if Age 61-80 years, 30 pack-year currently smoking OR have quit w/in 15years.) does not qualify.   Lung Cancer Screening Referral: no  Additional Screening:  Hepatitis C Screening: does qualify; Completed DUE AT NEXT VISIT  Vision Screening: Recommended annual ophthalmology exams for early detection of glaucoma and other disorders of the eye. Is the patient up to date with their annual eye exam?  Yes  Who is the provider or what is the name of the office in which the patient attends annual eye exams? [facility] If pt is not established with a provider, would they like to be referred to a provider to establish care? No .   Dental  Screening: Recommended annual dental exams for proper oral hygiene  Community Resource Referral / Chronic Care Management: CRR required this visit?  No   CCM required this visit?  No      Plan:     I have personally reviewed and noted the following in the patient's chart:   Medical and social history Use of alcohol, tobacco or illicit drugs  Current medications and supplements including opioid prescriptions. Patient is not currently taking opioid prescriptions. Functional ability and status Nutritional status Physical activity Advanced directives List of other physicians Hospitalizations, surgeries, and ER visits in previous 12 months Vitals Screenings to include cognitive, depression, and falls Referrals and appointments  In addition, I have reviewed and discussed with patient certain preventive protocols, quality metrics, and best practice recommendations. A written personalized care plan for preventive services as well as general preventive health recommendations were provided to patient.     Maryan Puls, LPN   [id number]/61/0960   Nurse Notes: none

## 2023-04-16 NOTE — Patient Instructions (Signed)
Bridget [name] , Thank you for taking time to come for your Medicare Wellness Visit. I appreciate your ongoing commitment to your health goals. Please review the following plan we discussed and let me know if I can assist you in the future.   These are the goals we discussed:  Goals      Patient Stated     Be more active.        This is a list of the screening recommended for you and due dates:  Health Maintenance  Topic Date Due   COVID-19 Vaccine (3 - Pfizer risk series) 02/16/2020   Mammogram  03/09/2023   Hepatitis C Screening: USPSTF Recommendation to screen - Ages 18-79 yo.  01/26/2024*   Flu Shot  07/25/2023   Medicare Annual Wellness Visit  04/15/2024   DTaP/Tdap/Td vaccine (3 - Td or Tdap) 05/07/2026   Colon Cancer Screening  07/06/2031   Pneumonia Vaccine  Completed   DEXA scan (bone density measurement)  Completed   Zoster (Shingles) Vaccine  Completed   HPV Vaccine  Aged Out  *Topic was postponed. The date shown is not the original due date.    Advanced directives: Please bring a copy of your health care power of attorney and living will to the office to be added to your chart at your convenience.   Conditions/risks identified: none  Next appointment: Follow up in one year for your annual wellness visit 04/16/24 @ 3:00  televisit   Preventive Care 65 Years and Older, Female Preventive care refers to lifestyle choices and visits with your health care provider that can promote health and wellness. What does preventive care include? A yearly physical exam. This is also called an annual well check. Dental exams once or twice a year. Routine eye exams. Ask your health care provider how often you should have your eyes checked. Personal lifestyle choices, including: Daily care of your teeth and gums. Regular physical activity. Eating a healthy diet. Avoiding tobacco and drug use. Limiting alcohol use. Practicing safe sex. Taking low-dose aspirin every  day. Taking vitamin and mineral supplements as recommended by your health care provider. What happens during an annual well check? The services and screenings done by your health care provider during your annual well check will depend on your age, overall health, lifestyle risk factors, and family history of disease. Counseling  Your health care provider may ask you questions about your: Alcohol use. Tobacco use. Drug use. Emotional well-being. Home and relationship well-being. Sexual activity. Eating habits. History of falls. Memory and ability to understand (cognition). Work and work Astronomer. Reproductive health. Screening  You may have the following tests or measurements: Height, weight, and BMI. Blood pressure. Lipid and cholesterol levels. These may be checked every 5 years, or more frequently if you are over 30 years old. Skin check. Lung cancer screening. You may have this screening every year starting at age 33 if you have a 30-pack-year history of smoking and currently smoke or have quit within the past 15 years. Fecal occult blood test (FOBT) of the stool. You may have this test every year starting at age 50. Flexible sigmoidoscopy or colonoscopy. You may have a sigmoidoscopy every 5 years or a colonoscopy every 10 years starting at age 62. Hepatitis C blood test. Hepatitis B blood test. Sexually transmitted disease (STD) testing. Diabetes screening. This is done by checking your blood sugar (glucose) after you have not eaten for a while (fasting). You may have this done every 1-3 years.  Bone density scan. This is done to screen for osteoporosis. You may have this done starting at age 68. Mammogram. This may be done every 1-2 years. Talk to your health care provider about how often you should have regular mammograms. Talk with your health care provider about your test results, treatment options, and if necessary, the need for more tests. Vaccines  Your health care  provider may recommend certain vaccines, such as: Influenza vaccine. This is recommended every year. Tetanus, diphtheria, and acellular pertussis (Tdap, Td) vaccine. You may need a Td booster every 10 years. Zoster vaccine. You may need this after age 42. Pneumococcal 13-valent conjugate (PCV13) vaccine. One dose is recommended after age 22. Pneumococcal polysaccharide (PPSV23) vaccine. One dose is recommended after age 30. Talk to your health care provider about which screenings and vaccines you need and how often you need them. This information is not intended to replace advice given to you by your health care provider. Make sure you discuss any questions you have with your health care provider. Document Released: 01/06/2016 Document Revised: 08/29/2016 Document Reviewed: 10/11/2015 Elsevier Interactive Patient Education  2017 [place] Prevention in the Home Falls can cause injuries. They can happen to people of all ages. There are many things you can do to make your home safe and to help prevent falls. What can I do on the outside of my home? Regularly fix the edges of walkways and driveways and fix any cracks. Remove anything that might make you trip as you walk through a door, such as a raised step or threshold. Trim any bushes or trees on the path to your home. Use bright outdoor lighting. Clear any walking paths of anything that might make someone trip, such as rocks or tools. Regularly check to see if handrails are loose or broken. Make sure that both sides of any steps have handrails. Any raised decks and porches should have guardrails on the edges. Have any leaves, snow, or ice cleared regularly. Use sand or salt on walking paths during winter. Clean up any spills in your garage right away. This includes oil or grease spills. What can I do in the bathroom? Use night lights. Install grab bars by the toilet and in the tub and shower. Do not use towel bars as grab  bars. Use non-skid mats or decals in the tub or shower. If you need to sit down in the shower, use a plastic, non-slip stool. Keep the floor dry. Clean up any water that spills on the floor as soon as it happens. Remove soap buildup in the tub or shower regularly. Attach bath mats securely with double-sided non-slip rug tape. Do not have throw rugs and other things on the floor that can make you trip. What can I do in the bedroom? Use night lights. Make sure that you have a light by your bed that is easy to reach. Do not use any sheets or blankets that are too big for your bed. They should not hang down onto the floor. Have a firm chair that has side arms. You can use this for support while you get dressed. Do not have throw rugs and other things on the floor that can make you trip. What can I do in the kitchen? Clean up any spills right away. Avoid walking on wet floors. Keep items that you use a lot in easy-to-reach places. If you need to reach something above you, use a strong step stool that has a grab bar.  Keep electrical cords out of the way. Do not use floor polish or wax that makes floors slippery. If you must use wax, use non-skid floor wax. Do not have throw rugs and other things on the floor that can make you trip. What can I do with my stairs? Do not leave any items on the stairs. Make sure that there are handrails on both sides of the stairs and use them. Fix handrails that are broken or loose. Make sure that handrails are as long as the stairways. Check any carpeting to make sure that it is firmly attached to the stairs. Fix any carpet that is loose or worn. Avoid having throw rugs at the top or bottom of the stairs. If you do have throw rugs, attach them to the floor with carpet tape. Make sure that you have a light switch at the top of the stairs and the bottom of the stairs. If you do not have them, ask someone to add them for you. What else can I do to help prevent  falls? Wear shoes that: Do not have high heels. Have rubber bottoms. Are comfortable and fit you well. Are closed at the toe. Do not wear sandals. If you use a stepladder: Make sure that it is fully opened. Do not climb a closed stepladder. Make sure that both sides of the stepladder are locked into place. Ask someone to hold it for you, if possible. Clearly mark and make sure that you can see: Any grab bars or handrails. First and last steps. Where the edge of each step is. Use tools that help you move around (mobility aids) if they are needed. These include: Canes. Walkers. Scooters. Crutches. Turn on the lights when you go into a dark area. Replace any light bulbs as soon as they burn out. Set up your furniture so you have a clear path. Avoid moving your furniture around. If any of your floors are uneven, fix them. If there are any pets around you, be aware of where they are. Review your medicines with your doctor. Some medicines can make you feel dizzy. This can increase your chance of falling. Ask your doctor what other things that you can do to help prevent falls. This information is not intended to replace advice given to you by your health care provider. Make sure you discuss any questions you have with your health care provider. Document Released: 10/06/2009 Document Revised: 05/17/2016 Document Reviewed: 01/14/2015 Elsevier Interactive Patient Education  2017 [employer].

## 2023-04-17 ENCOUNTER — Encounter: Payer: Self-pay | Admitting: Family Medicine

## 2023-04-17 ENCOUNTER — Ambulatory Visit (INDEPENDENT_AMBULATORY_CARE_PROVIDER_SITE_OTHER): Payer: Medicare HMO | Admitting: Family Medicine

## 2023-04-17 VITALS — BP 122/62 | HR 73 | Temp 97.6°F | Ht 62.0 in | Wt 151.0 lb

## 2023-04-17 DIAGNOSIS — Z0000 Encounter for general adult medical examination without abnormal findings: Secondary | ICD-10-CM

## 2023-04-17 DIAGNOSIS — Z8709 Personal history of other diseases of the respiratory system: Secondary | ICD-10-CM | POA: Diagnosis not present

## 2023-04-17 DIAGNOSIS — E785 Hyperlipidemia, unspecified: Secondary | ICD-10-CM | POA: Diagnosis not present

## 2023-04-17 DIAGNOSIS — M8589 Other specified disorders of bone density and structure, multiple sites: Secondary | ICD-10-CM

## 2023-04-17 DIAGNOSIS — R7303 Prediabetes: Secondary | ICD-10-CM

## 2023-04-17 DIAGNOSIS — Z1211 Encounter for screening for malignant neoplasm of colon: Secondary | ICD-10-CM

## 2023-04-17 LAB — POCT RAPID STREP A (OFFICE): Rapid Strep A Screen: NEGATIVE

## 2023-04-17 NOTE — Assessment & Plan Note (Signed)
Disc goals for lipids and reasons to control them Rev last labs with pt Rev low sat fat diet in detail   LDL is down to 117 and HDL in mid 50s  Improved Enc her to keep up the good work  Avoiding red meat now

## 2023-04-17 NOTE — Patient Instructions (Addendum)
Add some strength training to your routine, this is important for bone and brain health and can reduce your risk of falls and help your body use insulin properly and regulate weight  Light weights, exercise bands , and internet videos are a good way to start  Yoga (chair or regular), machines , floor exercises or a gym with machines are also good options    Avoid added sugars and processed carbs Try to get most of your carbohydrates from produce (with the exception of white potatoes)  Eat less bread/pasta/rice/snack foods/cereals/sweets and other items from the middle of the grocery store (processed carbs)  Strep test is negative today    Go ahead and schedule your mammogram   You have an order for:    2D Mammogram    3D Mammogram    Bone Density     Please call for appointment:     [facility]  [address] [place] [address]  [phone]    [facility])   [address]  [place], [place] [address]  [phone]    [facility]      [address] [place], [place]        [phone]           [facility]  [address] [place], [place]  [phone]   [facility]   [address]   [place], [place] [address]  [phone]   [facility]  [address] [place], [place] [address] [phone]    Make sure to wear two piece clothing  No lotions powders or deodorants the day of the appointment Make sure to bring picture ID and insurance card.  Bring list of medications you are currently taking including any supplements.   Schedule your screening mammogram through MyChart!   [facility] [place] imaging sites can now be scheduled through MyChart.  Log into your MyChart account.  Go to 'Visit'  (or 'Appointments' if  on mobile App) --> Schedule an  Appointment  Under 'Select a Reason for Visit' choose the Mammogram  Screening option.  Complete the pre-visit questions  and select the time and place that  best fits your schedule

## 2023-04-17 NOTE — Assessment & Plan Note (Signed)
Did strep test today to make sure not a carrier since she has had it frequently  Negative-reassuring

## 2023-04-17 NOTE — Assessment & Plan Note (Signed)
Lab Results  Component Value Date   HGBA1C 6.0 04/11/2023   Of note-had some steroid joint inj prior Will continue to follow disc imp of low glycemic diet and wt loss to prevent DM2

## 2023-04-17 NOTE — Assessment & Plan Note (Signed)
Reviewed health habits including diet and exercise and skin cancer prevention Reviewed appropriate screening tests for age  Also reviewed health mt list, fam hx and immunization status , as well as social and family history   See HPI Labs reviewed and ordered Mammogram due - will schedule at [facility] imaging  Colonoscopy utd 06/2021 Dexa 02/2022 , one fall/no fx and enc better compliance with vit D as well as add of strength building exercise when able  PHQ score of 0 Derm care utd (had moh's for nasal lesion)  Enc sun protection

## 2023-04-17 NOTE — Assessment & Plan Note (Signed)
Dexa 02/2022  Osteopenia improved a bit  Enc better compliance with vit D Good ca from diet Enc to continue exercise and add more strength training as tolerated  Re check at 2 years  Has had a fall/no fractures Disc fall prevention

## 2023-04-17 NOTE — Progress Notes (Signed)
Subjective:    Patient ID: Bridget [name], female    DOB: 1956/10/28, 66 y.o.   MRN: [id number]  HPI Here for health maintenance exam and to review chronic medical problems   Re check strep test (has had freq strep and want to make sure she is not a carrier)    Wt Readings from Last 3 Encounters:  04/17/23 151 lb (68.5 kg)  04/16/23 149 lb (67.6 kg)  03/14/23 151 lb 4 oz (68.6 kg)   27.62 kg/m  Vitals:   04/17/23 0952  BP: 122/62  Pulse: 73  Temp: 97.6 F (36.4 C)  SpO2: 96%   Immunization History  Administered Date(s) Administered   Influenza Split 10/23/2011   Influenza,inj,Quad PF,6+ Mos 10/07/2016   Influenza-Unspecified 09/25/2015, 09/17/2017, 08/25/2018   PFIZER(Purple Top)SARS-COV-2 Vaccination 12/31/2019, 01/19/2020   PNEUMOCOCCAL CONJUGATE-20 09/04/2021   Td 08/12/2006   Tdap 05/07/2016   Zoster Recombinat (Shingrix) 09/30/2019, 02/16/2020   Health Maintenance Due  Topic Date Due   MAMMOGRAM  03/09/2023  Has been renovating house/new shower  Working hard on that   Her hot water heater quit working yesterday- swapping out those   6 mo grand baby -enjoying    Mammogram 02/2022 at [facility] imaging  Self breast exam: no lumps   Colonoscopy 06/2021  Dexa  02/2022 -osteopenia improved a bit  Falls: has tripped and fallen on pickle/tennis court- no fractures  Fractures:none Supplements : D - not always every day  Good dairy products  Exercise : plays racket sports   Mood    04/17/2023    9:59 AM 04/16/2023   11:37 AM 03/14/2023   10:34 AM 01/08/2023    4:14 PM 09/04/2021    2:06 PM  Depression screen PHQ 2/9  Decreased Interest 0 0 0 0 0  Down, Depressed, Hopeless 0 0 0 0 0  PHQ - 2 Score 0 0 0 0 0  Altered sleeping 0 0 1 0 0  Tired, decreased energy 0 0 1 0 0  Change in appetite 0 0 1 0 0  Feeling bad or failure about yourself  0 0 0 0 0  Trouble concentrating 0 0 0 0 0  Moving slowly or fidgety/restless 0 0 0 0 0  Suicidal thoughts 0  0 0 0 0  PHQ-9 Score 0 0 3 0 0  Difficult doing work/chores Not difficult at all Not difficult at all Not difficult at all Not difficult at all Not difficult at all      Derm care : had moh's surgery in march Doing well  Wears sun protectoin   Diabetes screening Lab Results  Component Value Date   HGBA1C 6.0 04/11/2023   Had some local steroid shots / joint   Hyperlipidemia  Lab Results  Component Value Date   CHOL 189 04/11/2023   CHOL 207 (H) 05/23/2021   CHOL 198 07/28/2019   Lab Results  Component Value Date   HDL 55.50 04/11/2023   HDL 54.50 05/23/2021   HDL 53.80 07/28/2019   Lab Results  Component Value Date   LDLCALC 117 (H) 04/11/2023   LDLCALC 136 (H) 05/23/2021   LDLCALC 126 (H) 07/28/2019   Lab Results  Component Value Date   TRIG 84.0 04/11/2023   TRIG 85.0 05/23/2021   TRIG 91.0 07/28/2019   Lab Results  Component Value Date   CHOLHDL 3 04/11/2023   CHOLHDL 4 05/23/2021   CHOLHDL 4 07/28/2019   No results found for: "LDLDIRECT"  Improved  Eating less red meat   Last metabolic panel Lab Results  Component Value Date   GLUCOSE 89 04/11/2023   NA 141 04/11/2023   K 4.2 04/11/2023   CL 106 04/11/2023   CO2 28 04/11/2023   BUN 18 04/11/2023   CREATININE 0.66 04/11/2023   GFRNONAA >60 10/31/2017   CALCIUM 9.0 04/11/2023   PROT 6.4 04/11/2023   ALBUMIN 4.1 04/11/2023   BILITOT 0.5 04/11/2023   ALKPHOS 68 04/11/2023   AST 16 04/11/2023   ALT 10 04/11/2023   ANIONGAP 9 10/31/2017   Lab Results  Component Value Date   TSH 1.66 04/11/2023   Lab Results  Component Value Date   WBC 5.5 04/11/2023   HGB 13.3 04/11/2023   HCT 40.0 04/11/2023   MCV 88.2 04/11/2023   PLT 290.0 04/11/2023   Results for orders placed or performed in visit on 04/17/23  POCT rapid strep A  Result Value Ref Range   Rapid Strep A Screen Negative Negative     Patient Active Problem List   Diagnosis Date Noted   Prediabetes 04/02/2023   Sciatica  08/27/2022   Loud snoring 09/04/2021   Colon cancer screening 04/28/2021   Mild hyperlipidemia 05/04/2018   Estrogen deficiency 04/30/2018   Osteopenia 09/30/2013   Encounter for routine gynecological examination 01/18/2012   Routine general medical examination at a health care facility 01/09/2012   History of streptococcal sore throat 12/28/2011   FASCIITIS, PLANTAR 02/06/2010   DEGENERATIVE JOINT DISEASE, MILD 07/19/2009   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   Cancer    BASAL CELL   Chronic back pain    COVID-19 08/2021   GERD (gastroesophageal reflux disease)    OCC   Past Surgical History:  Procedure Laterality Date   ANKLE SURGERY Right    BASAL CELL CARCINOMA EXCISION  12/06/2017   forehead   BREAST BIOPSY Right 02/05/2018   BREAST SURGERY  01/25/2018   Breast biopsy   CESAREAN SECTION  [phone]   hodp pre term labor CS   COLONOSCOPY WITH PROPOFOL N/A 07/05/2021   Procedure: COLONOSCOPY WITH PROPOFOL;  Surgeon: Wyline Mood, MD;  Location: [facility] ENDOSCOPY;  Service: Gastroenterology;  Laterality: N/A;   MOHS SURGERY     02/2023   OVARIAN CYST SURGERY  1973   SHOULDER ARTHROSCOPY Right 10/31/2017   Procedure: ARTHROSCOPY SHOULDER WITH LYSIS OF ADHESION, SUBACROMINAL DECOMPRESSION,DISTAL CLAVICLE EXCISION.;  Surgeon: Juanell Fairly, MD;  Location: [facility] ORS;  Service: Orthopedics;  Laterality: Right;  SHOULDER ARTHROSCOPY WITH LYSIS AND RESECTION OF ADHESIONS   SHOULDER ARTHROSCOPY WITH OPEN ROTATOR CUFF REPAIR AND DISTAL CLAVICLE ACROMINECTOMY Right 03/19/2017   Procedure: SHOULDER ARTHROSCOPY WITH OPEN ROTATOR CUFF REPAIR AND SUBACROMINAL DECOMPRESSION;  Surgeon: Juanell Fairly, MD;  Location: [facility] OR[facility];  Service: Orthopedics;  Laterality: Right;   TUBAL LIGATION  1999   WRIST GANGLION EXCISION     Social History   Tobacco Use   Smoking status: Never   Smokeless tobacco: Never  Vaping Use   Vaping Use: Never used   Substance Use Topics   Alcohol use: Yes    Alcohol/week: 4.0 standard drinks of alcohol    Types: 4 Glasses of wine per week    Comment: 2-3 nights per week   Drug use: No   Family History  Problem Relation Age of Onset   Arthritis Mother        OA with verterabral fx   Hypertension Mother  Fibrocystic breast disease Mother    Cancer Maternal Grandfather        colon CA   Diabetes Paternal Grandmother    Allergies  Allergen Reactions   Lorcet [Hydrocodone-Acetaminophen] Rash   Current Outpatient Medications on File Prior to Visit  Medication Sig Dispense Refill   Acetaminophen (TYLENOL 8 HOUR PO) Take by mouth.     Cholecalciferol 25 MCG (1000 UT) capsule Take 2,000 Units by mouth daily.      Docusate Calcium (STOOL SOFTENER PO) Take 2 tablets by mouth daily.     ibuprofen (ADVIL,MOTRIN) 200 MG tablet Take 400 mg by mouth 2 (two) times daily as needed for mild pain.      MOBIC 15 MG tablet Take 15 mg by mouth daily as needed.     Multiple Vitamin (MULTIVITAMIN) capsule Take 1 capsule by mouth daily.     Polyethyl Glycol-Propyl Glycol (SYSTANE OP) Apply 2 drops to eye as needed (dry eyes).     Probiotic Product (PROBIOTIC ADVANCED PO) Take by mouth as needed.     No current facility-administered medications on file prior to visit.      Review of Systems  Constitutional:  Positive for fatigue. Negative for activity change, appetite change, fever and unexpected weight change.       Busy schedule  HENT:  Negative for congestion, ear pain, rhinorrhea, sinus pressure and sore throat.   Eyes:  Negative for pain, redness and visual disturbance.  Respiratory:  Negative for cough, shortness of breath and wheezing.   Cardiovascular:  Negative for chest pain and palpitations.  Gastrointestinal:  Negative for abdominal pain, blood in stool, constipation and diarrhea.  Endocrine: Negative for polydipsia and polyuria.  Genitourinary:  Negative for dysuria, frequency and urgency.   Musculoskeletal:  Negative for arthralgias, back pain and myalgias.  Skin:  Negative for pallor and rash.  Allergic/Immunologic: Negative for environmental allergies.  Neurological:  Negative for dizziness, syncope and headaches.  Hematological:  Negative for adenopathy. Does not bruise/bleed easily.  Psychiatric/Behavioral:  Negative for decreased concentration and dysphoric mood. The patient is not nervous/anxious.        Objective:   Physical Exam Constitutional:      General: She is not in acute distress.    Appearance: Normal appearance. She is well-developed. She is not ill-appearing or diaphoretic.  HENT:     Head: Normocephalic and atraumatic.     Right Ear: Tympanic membrane, ear canal and external ear normal.     Left Ear: Tympanic membrane, ear canal and external ear normal.     Nose: Nose normal. No congestion.     Mouth/Throat:     Mouth: Mucous membranes are moist.     Pharynx: Oropharynx is clear. No posterior oropharyngeal erythema.  Eyes:     General: No scleral icterus.    Extraocular Movements: Extraocular movements intact.     Conjunctiva/sclera: Conjunctivae normal.     Pupils: Pupils are equal, round, and reactive to light.  Neck:     Thyroid: No thyromegaly.     Vascular: No carotid bruit or JVD.  Cardiovascular:     Rate and Rhythm: Normal rate and regular rhythm.     Pulses: Normal pulses.     Heart sounds: Normal heart sounds.     No gallop.  Pulmonary:     Effort: Pulmonary effort is normal. No respiratory distress.     Breath sounds: Normal breath sounds. No wheezing.     Comments: Good air exch Chest:  Chest wall: No tenderness.  Abdominal:     General: Bowel sounds are normal. There is no distension or abdominal bruit.     Palpations: Abdomen is soft. There is no mass.     Tenderness: There is no abdominal tenderness.     Hernia: No hernia is present.  Genitourinary:    Comments: Breast exam: No mass, nodules, thickening, tenderness,  bulging, retraction, inflamation, nipple discharge or skin changes noted.  No axillary or clavicular LA.     Musculoskeletal:        General: No tenderness. Normal range of motion.     Cervical back: Normal range of motion and neck supple. No rigidity. No muscular tenderness.     Right lower leg: No edema.     Left lower leg: No edema.     Comments: No kyphosis   Lymphadenopathy:     Cervical: No cervical adenopathy.  Skin:    General: Skin is warm and dry.     Coloration: Skin is not pale.     Findings: No erythema or rash.     Comments: Solar lentigines diffusely  Healed wound on nose from recent Moh's   Neurological:     Mental Status: She is alert. Mental status is at baseline.     Cranial Nerves: No cranial nerve deficit.     Motor: No abnormal muscle tone.     Coordination: Coordination normal.     Gait: Gait normal.     Deep Tendon Reflexes: Reflexes are normal and symmetric. Reflexes normal.  Psychiatric:        Mood and Affect: Mood normal.        Cognition and Memory: Cognition and memory normal.           Assessment & Plan:   Problem List Items Addressed This Visit       Musculoskeletal and Integument   Osteopenia    Dexa 02/2022  Osteopenia improved a bit  Enc better compliance with vit D Good ca from diet Enc to continue exercise and add more strength training as tolerated  Re check at 2 years  Has had a fall/no fractures Disc fall prevention         Other   Colon cancer screening    Colonoscopy 06/2021 - up to date       History of streptococcal sore throat    Did strep test today to make sure not a carrier since she has had it frequently  Negative-reassuring      Relevant Orders   POCT rapid strep A (Completed)   Mild hyperlipidemia    Disc goals for lipids and reasons to control them Rev last labs with pt Rev low sat fat diet in detail   LDL is down to 117 and HDL in mid 50s  Improved Enc her to keep up the good work  Avoiding red  meat now       Prediabetes    Lab Results  Component Value Date   HGBA1C 6.0 04/11/2023  Of note-had some steroid joint inj prior Will continue to follow disc imp of low glycemic diet and wt loss to prevent DM2       Routine general medical examination at a health care facility - Primary    Reviewed health habits including diet and exercise and skin cancer prevention Reviewed appropriate screening tests for age  Also reviewed health mt list, fam hx and immunization status , as well as social and family history  See HPI Labs reviewed and ordered Mammogram due - will schedule at [facility] imaging  Colonoscopy utd 06/2021 Dexa 02/2022 , one fall/no fx and enc better compliance with vit D as well as add of strength building exercise when able  PHQ score of 0 Derm care utd (had moh's for nasal lesion)  Enc sun protection

## 2023-04-17 NOTE — Assessment & Plan Note (Signed)
Colonoscopy 06/2021 - up to date

## 2023-04-23 DIAGNOSIS — Z85828 Personal history of other malignant neoplasm of skin: Secondary | ICD-10-CM | POA: Diagnosis not present

## 2023-05-06 DIAGNOSIS — M7672 Peroneal tendinitis, left leg: Secondary | ICD-10-CM | POA: Diagnosis not present

## 2023-05-10 ENCOUNTER — Other Ambulatory Visit: Payer: Self-pay | Admitting: Family Medicine

## 2023-05-10 DIAGNOSIS — Z1231 Encounter for screening mammogram for malignant neoplasm of breast: Secondary | ICD-10-CM

## 2023-05-15 DIAGNOSIS — H2513 Age-related nuclear cataract, bilateral: Secondary | ICD-10-CM | POA: Diagnosis not present

## 2023-05-28 DIAGNOSIS — Z0100 Encounter for examination of eyes and vision without abnormal findings: Secondary | ICD-10-CM | POA: Diagnosis not present

## 2023-05-30 ENCOUNTER — Ambulatory Visit
Admission: RE | Admit: 2023-05-30 | Discharge: 2023-05-30 | Disposition: A | Discharge status: Home / Self Care | Payer: Medicare HMO | Source: Ambulatory Visit | Attending: Family Medicine | Admitting: Family Medicine

## 2023-05-30 DIAGNOSIS — Z1231 Encounter for screening mammogram for malignant neoplasm of breast: Secondary | ICD-10-CM | POA: Diagnosis not present

## 2023-06-05 DIAGNOSIS — M7672 Peroneal tendinitis, left leg: Secondary | ICD-10-CM | POA: Diagnosis not present

## 2023-07-01 DIAGNOSIS — M7672 Peroneal tendinitis, left leg: Secondary | ICD-10-CM | POA: Diagnosis not present

## 2023-07-11 DIAGNOSIS — M7672 Peroneal tendinitis, left leg: Secondary | ICD-10-CM | POA: Diagnosis not present

## 2023-07-23 DIAGNOSIS — R69 Illness, unspecified: Secondary | ICD-10-CM | POA: Diagnosis not present

## 2023-07-25 DIAGNOSIS — R69 Illness, unspecified: Secondary | ICD-10-CM | POA: Diagnosis not present

## 2023-07-29 DIAGNOSIS — L570 Actinic keratosis: Secondary | ICD-10-CM | POA: Diagnosis not present

## 2023-07-29 DIAGNOSIS — D2262 Melanocytic nevi of left upper limb, including shoulder: Secondary | ICD-10-CM | POA: Diagnosis not present

## 2023-07-29 DIAGNOSIS — D2272 Melanocytic nevi of left lower limb, including hip: Secondary | ICD-10-CM | POA: Diagnosis not present

## 2023-07-29 DIAGNOSIS — D225 Melanocytic nevi of trunk: Secondary | ICD-10-CM | POA: Diagnosis not present

## 2023-07-29 DIAGNOSIS — D2261 Melanocytic nevi of right upper limb, including shoulder: Secondary | ICD-10-CM | POA: Diagnosis not present

## 2023-07-29 DIAGNOSIS — Z85828 Personal history of other malignant neoplasm of skin: Secondary | ICD-10-CM | POA: Diagnosis not present

## 2023-07-30 DIAGNOSIS — M7672 Peroneal tendinitis, left leg: Secondary | ICD-10-CM | POA: Diagnosis not present

## 2023-08-15 DIAGNOSIS — M7672 Peroneal tendinitis, left leg: Secondary | ICD-10-CM | POA: Diagnosis not present

## 2023-09-10 DIAGNOSIS — R69 Illness, unspecified: Secondary | ICD-10-CM | POA: Diagnosis not present

## 2023-10-29 DIAGNOSIS — R69 Illness, unspecified: Secondary | ICD-10-CM | POA: Diagnosis not present

## 2023-12-04 DIAGNOSIS — M7672 Peroneal tendinitis, left leg: Secondary | ICD-10-CM | POA: Diagnosis not present

## 2023-12-04 DIAGNOSIS — M25572 Pain in left ankle and joints of left foot: Secondary | ICD-10-CM | POA: Diagnosis not present

## 2023-12-30 DIAGNOSIS — M25572 Pain in left ankle and joints of left foot: Secondary | ICD-10-CM | POA: Diagnosis not present

## 2024-01-06 DIAGNOSIS — M7672 Peroneal tendinitis, left leg: Secondary | ICD-10-CM | POA: Diagnosis not present

## 2024-02-10 DIAGNOSIS — H2513 Age-related nuclear cataract, bilateral: Secondary | ICD-10-CM | POA: Diagnosis not present

## 2024-02-14 ENCOUNTER — Encounter: Payer: Self-pay | Admitting: Primary Care

## 2024-03-03 DIAGNOSIS — H2513 Age-related nuclear cataract, bilateral: Secondary | ICD-10-CM | POA: Diagnosis not present

## 2024-03-03 DIAGNOSIS — H179 Unspecified corneal scar and opacity: Secondary | ICD-10-CM | POA: Diagnosis not present

## 2024-03-03 DIAGNOSIS — H40053 Ocular hypertension, bilateral: Secondary | ICD-10-CM | POA: Diagnosis not present

## 2024-03-03 DIAGNOSIS — H43813 Vitreous degeneration, bilateral: Secondary | ICD-10-CM | POA: Diagnosis not present

## 2024-03-30 DIAGNOSIS — L570 Actinic keratosis: Secondary | ICD-10-CM | POA: Diagnosis not present

## 2024-03-30 DIAGNOSIS — D225 Melanocytic nevi of trunk: Secondary | ICD-10-CM | POA: Diagnosis not present

## 2024-03-30 DIAGNOSIS — D2271 Melanocytic nevi of right lower limb, including hip: Secondary | ICD-10-CM | POA: Diagnosis not present

## 2024-03-30 DIAGNOSIS — D2262 Melanocytic nevi of left upper limb, including shoulder: Secondary | ICD-10-CM | POA: Diagnosis not present

## 2024-03-30 DIAGNOSIS — D2261 Melanocytic nevi of right upper limb, including shoulder: Secondary | ICD-10-CM | POA: Diagnosis not present

## 2024-04-07 DIAGNOSIS — H2511 Age-related nuclear cataract, right eye: Secondary | ICD-10-CM | POA: Diagnosis not present

## 2024-04-20 ENCOUNTER — Encounter: Payer: Self-pay | Admitting: Ophthalmology

## 2024-04-21 DIAGNOSIS — H2513 Age-related nuclear cataract, bilateral: Secondary | ICD-10-CM | POA: Diagnosis not present

## 2024-04-27 NOTE — Discharge Instructions (Signed)

## 2024-04-29 ENCOUNTER — Ambulatory Visit
Admission: RE | Admit: 2024-04-29 | Discharge: 2024-04-29 | Disposition: A | Discharge status: Home / Self Care | Payer: Self-pay | Attending: Ophthalmology | Admitting: Ophthalmology

## 2024-04-29 ENCOUNTER — Ambulatory Visit: Payer: Self-pay | Admitting: Anesthesiology

## 2024-04-29 ENCOUNTER — Encounter
Admission: RE | Disposition: A | Discharge status: Home / Self Care | Payer: Self-pay | Source: Home / Self Care | Attending: Ophthalmology

## 2024-04-29 ENCOUNTER — Other Ambulatory Visit: Payer: Self-pay

## 2024-04-29 ENCOUNTER — Encounter: Payer: Self-pay | Admitting: Ophthalmology

## 2024-04-29 DIAGNOSIS — K219 Gastro-esophageal reflux disease without esophagitis: Secondary | ICD-10-CM | POA: Insufficient documentation

## 2024-04-29 DIAGNOSIS — H2512 Age-related nuclear cataract, left eye: Secondary | ICD-10-CM | POA: Diagnosis not present

## 2024-04-29 DIAGNOSIS — M1990 Unspecified osteoarthritis, unspecified site: Secondary | ICD-10-CM | POA: Diagnosis not present

## 2024-04-29 DIAGNOSIS — H2511 Age-related nuclear cataract, right eye: Secondary | ICD-10-CM | POA: Diagnosis not present

## 2024-04-29 DIAGNOSIS — Z79899 Other long term (current) drug therapy: Secondary | ICD-10-CM | POA: Insufficient documentation

## 2024-04-29 DIAGNOSIS — G4730 Sleep apnea, unspecified: Secondary | ICD-10-CM | POA: Diagnosis not present

## 2024-04-29 HISTORY — DX: Sleep apnea, unspecified: G47.30

## 2024-04-29 HISTORY — PX: CATARACT EXTRACTION W/PHACO: SHX586

## 2024-04-29 HISTORY — DX: Presence of spectacles and contact lenses: Z97.3

## 2024-04-29 SURGERY — PHACOEMULSIFICATION, CATARACT, WITH IOL INSERTION
Anesthesia: Monitor Anesthesia Care | Site: Eye | Laterality: Right

## 2024-04-29 MED ORDER — CEFUROXIME OPHTHALMIC INJECTION 1 MG/0.1 ML
INJECTION | OPHTHALMIC | Status: DC | PRN
  Administered 2024-04-29: 1 mg via INTRACAMERAL

## 2024-04-29 MED ORDER — FENTANYL CITRATE (PF) 100 MCG/2ML IJ SOLN
INTRAMUSCULAR | Status: DC | PRN
  Administered 2024-04-29: 50 ug via INTRAVENOUS

## 2024-04-29 MED ORDER — SIGHTPATH DOSE#1 NA HYALUR & NA CHOND-NA HYALUR IO KIT
PACK | INTRAOCULAR | Status: DC | PRN
  Administered 2024-04-29: 1 via OPHTHALMIC

## 2024-04-29 MED ORDER — ARMC OPHTHALMIC DILATING DROPS
OPHTHALMIC | Status: AC
  Filled 2024-04-29: qty 0.5

## 2024-04-29 MED ORDER — MIDAZOLAM HCL 2 MG/2ML IJ SOLN
INTRAMUSCULAR | Status: AC
Start: 2024-04-29 — End: ?
  Filled 2024-04-29: qty 2

## 2024-04-29 MED ORDER — BRIMONIDINE TARTRATE-TIMOLOL 0.2-0.5 % OP SOLN
OPHTHALMIC | Status: DC | PRN
  Administered 2024-04-29: 1 [drp] via OPHTHALMIC

## 2024-04-29 MED ORDER — SIGHTPATH DOSE#1 BSS IO SOLN
INTRAOCULAR | Status: DC | PRN
  Administered 2024-04-29: 15 mL via INTRAOCULAR

## 2024-04-29 MED ORDER — SIGHTPATH DOSE#1 BSS IO SOLN
INTRAOCULAR | Status: DC | PRN
  Administered 2024-04-29: 51 mL via OPHTHALMIC

## 2024-04-29 MED ORDER — FENTANYL CITRATE (PF) 100 MCG/2ML IJ SOLN
INTRAMUSCULAR | Status: AC
  Filled 2024-04-29: qty 2

## 2024-04-29 MED ORDER — TETRACAINE HCL 0.5 % OP SOLN
OPHTHALMIC | Status: AC
  Filled 2024-04-29: qty 4

## 2024-04-29 MED ORDER — MIDAZOLAM HCL 2 MG/2ML IJ SOLN
INTRAMUSCULAR | Status: DC | PRN
  Administered 2024-04-29 (×2): 1 mg via INTRAVENOUS

## 2024-04-29 MED ORDER — TETRACAINE HCL 0.5 % OP SOLN
1.0000 [drp] | OPHTHALMIC | Status: DC | PRN
  Administered 2024-04-29 (×3): 1 [drp] via OPHTHALMIC

## 2024-04-29 MED ORDER — ARMC OPHTHALMIC DILATING DROPS
1.0000 | OPHTHALMIC | Status: DC | PRN
  Administered 2024-04-29 (×3): 1 via OPHTHALMIC

## 2024-04-29 MED ORDER — LIDOCAINE HCL (PF) 2 % IJ SOLN
INTRAOCULAR | Status: DC | PRN
  Administered 2024-04-29: 2 mL

## 2024-04-29 SURGICAL SUPPLY — 11 items
CATARACT SUITE SIGHTPATH (MISCELLANEOUS) ×1 IMPLANT
FEE CATARACT SUITE SIGHTPATH (MISCELLANEOUS) ×1 IMPLANT
GLOVE BIOGEL PI IND STRL 8 (GLOVE) ×1 IMPLANT
GLOVE SURG LX STRL 7.5 STRW (GLOVE) ×1 IMPLANT
GLOVE SURG PROTEXIS BL SZ6.5 (GLOVE) ×1 IMPLANT
GLOVE SURG SYN 6.5 PF PI BL (GLOVE) ×1 IMPLANT
LENS CLRN VIVITY TORIC 3 15.0 ×1 IMPLANT
LENS IOL CLRN VT TRC 3 15.0 IMPLANT
NDL FILTER BLUNT 18X1 1/2 (NEEDLE) ×1 IMPLANT
NEEDLE FILTER BLUNT 18X1 1/2 (NEEDLE) ×1 IMPLANT
SYR 3ML LL SCALE MARK (SYRINGE) ×1 IMPLANT

## 2024-04-29 NOTE — Anesthesia Preprocedure Evaluation (Signed)
 Anesthesia Evaluation  Patient identified by MRN, date of birth, ID band Patient awake    Reviewed: Allergy & Precautions, NPO status , Patient's Chart, lab work & pertinent test results  History of Anesthesia Complications Negative for: history of anesthetic complications  Airway Mallampati: III  TM Distance: <3 FB Neck ROM: full    Dental  (+) Chipped, Dental Advidsory Given   Pulmonary neg shortness of breath, sleep apnea (mild) , neg recent URI   Pulmonary exam normal        Cardiovascular Exercise Tolerance: Good (-) angina (-) Past MI and (-) DOE negative cardio ROS Normal cardiovascular exam     Neuro/Psych negative neurological ROS  negative psych ROS   GI/Hepatic Neg liver ROS,GERD  Medicated and Controlled,,  Endo/Other  negative endocrine ROS    Renal/GU negative Renal ROS  negative genitourinary   Musculoskeletal  (+) Arthritis ,    Abdominal   Peds  Hematology negative hematology ROS (+)   Anesthesia Other Findings Past Medical History: No date: Arthritis     Comment:  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 No date: Cancer (HCC)     Comment:  BASAL CELL No date: Chronic back pain No date: GERD (gastroesophageal reflux disease)     Comment:  OCC  Past Surgical History: No date: ANKLE SURGERY; Right 12/06/2017: BASAL CELL CARCINOMA EXCISION     Comment:  forehead 02/05/2018: BREAST BIOPSY; Right 01/25/2018: BREAST SURGERY     Comment:  Breast biopsy [phone]: CESAREAN SECTION     Comment:  hodp pre term labor CS 1973: OVARIAN CYST SURGERY 10/31/2017: SHOULDER ARTHROSCOPY; Right     Comment:  Procedure: ARTHROSCOPY SHOULDER WITH LYSIS OF ADHESION,               SUBACROMINAL DECOMPRESSION,DISTAL CLAVICLE EXCISION.;                Surgeon: Rande Bushy, MD;  Location: [facility] OR[facility];                Service: Orthopedics;  Laterality: Right;  SHOULDER               ARTHROSCOPY WITH LYSIS AND RESECTION OF  ADHESIONS 03/19/2017: SHOULDER ARTHROSCOPY WITH OPEN ROTATOR CUFF REPAIR AND  DISTAL CLAVICLE ACROMINECTOMY; Right     Comment:  Procedure: SHOULDER ARTHROSCOPY WITH OPEN ROTATOR CUFF               REPAIR AND SUBACROMINAL DECOMPRESSION;  Surgeon: Rande Bushy, MD;  Location: [facility] OR[facility];  Service:               Orthopedics;  Laterality: Right; 1999: TUBAL LIGATION No date: WRIST GANGLION EXCISION  BMI    Body Mass Index: 27.46 kg/m      Reproductive/Obstetrics negative OB ROS                             Anesthesia Physical Anesthesia Plan  ASA: 2  Anesthesia Plan: MAC   Post-op Pain Management:    Induction: Intravenous  PONV Risk Score and Plan: 2 and Midazolam   Airway Management Planned: Natural Airway and Nasal Cannula  Additional Equipment:   Intra-op Plan:   Post-operative Plan:   Informed Consent: I have reviewed the patients History and Physical, chart, labs and discussed the procedure including the risks, benefits and alternatives for the proposed anesthesia with the patient or authorized  representative who has indicated his/her understanding and acceptance.     Dental Advisory Given  Plan Discussed with: Anesthesiologist, CRNA and Surgeon  Anesthesia Plan Comments: (Patient consented for risks of anesthesia including but not limited to:  - adverse reactions to medications - risk of airway placement if required - damage to eyes, teeth, lips or other oral mucosa - nerve damage due to positioning  - sore throat or hoarseness - Damage to heart, brain, nerves, lungs, other parts of body or loss of life  Patient voiced understanding.)        Anesthesia Quick Evaluation

## 2024-04-29 NOTE — Anesthesia Postprocedure Evaluation (Signed)
 Anesthesia Post Note  Patient: Bridget [name]  Procedure(s) Performed: PHACOEMULSIFICATION, CATARACT, WITH IOL INSERTION 6.66 00:31.6 (Right: Eye)  Patient location during evaluation: PACU Anesthesia Type: MAC Level of consciousness: awake and alert Pain management: pain level controlled Vital Signs Assessment: post-procedure vital signs reviewed and stable Respiratory status: spontaneous breathing, nonlabored ventilation, respiratory function stable and patient connected to nasal cannula oxygen Cardiovascular status: stable and blood pressure returned to baseline Postop Assessment: no apparent nausea or vomiting Anesthetic complications: no   No notable events documented.   Last Vitals:  Vitals:   04/29/24 1157 04/29/24 1201  BP: (!) 106/58 101/75  Pulse: 67 72  Resp: 14 15  Temp: (!) 36.4 C (!) 36.4 C  SpO2: 97% 96%    Last Pain:  Vitals:   04/29/24 1201  TempSrc:   PainSc: 0-No pain                 Vanice Genre

## 2024-04-29 NOTE — Op Note (Signed)
 LOCATION:  [facility]   PREOPERATIVE DIAGNOSIS:  Nuclear sclerotic cataract of the right eye.  H25.11   POSTOPERATIVE DIAGNOSIS:  Nuclear sclerotic cataract of the right eye.   PROCEDURE:  Phacoemulsification with Toric posterior chamber intraocular lens placement of the right eye.  Ultrasound time: Procedure(s): PHACOEMULSIFICATION, CATARACT, WITH IOL INSERTION 6.66 00:31.6 (Right)  LENS:   Implant Name Type Inv. Item Serial No. Manufacturer Lot No. LRB No. Used Action  LENS CLRN VIVITY TORIC 3 15.0 - [id number]  LENS CLRN VIVITY TORIC 3 15.0 [id number] [employer]  Right 1 Implanted     [id number] Vivity Toric intraocular lens with 1.5 diopters of cylindrical power with axis orientation at 5 degrees.   SURGEON:  Berline Brenner, MD   ANESTHESIA: Topical with tetracaine drops and 2% Xylocaine  jelly, augmented with 1% preservative-free intracameral lidocaine . .   COMPLICATIONS:  None.   DESCRIPTION OF PROCEDURE:  The patient was identified in the holding room and transported to the operating suite and placed in the supine position under the operating microscope.  The right eye was identified as the operative eye, and it was prepped and draped in the usual sterile ophthalmic fashion.    A clear-corneal paracentesis incision was made at the 12:00 position.  0.5 ml of preservative-free 1% lidocaine  was injected into the anterior chamber. The anterior chamber was filled with Viscoat.  A 2.4 millimeter near clear corneal incision was then made at the 9:00 position.  A cystotome and capsulorrhexis forceps were then used to make a curvilinear capsulorrhexis.  Hydrodissection and hydrodelineation were then performed using balanced salt solution.   Phacoemulsification was then used in stop and chop fashion to remove the lens, nucleus and epinucleus.  The remaining cortex was aspirated using the irrigation and aspiration handpiece.  Provisc viscoelastic was then placed into the  capsular bag to distend it for lens placement.  The Verion digital marker was used to align the implant at the intended axis.   A Toric lens was then injected into the capsular bag.  It was rotated clockwise until the axis marks on the lens were approximately 15 degrees in the counterclockwise direction to the intended alignment.  The viscoelastic was aspirated from the eye using the irrigation aspiration handpiece.  Then, a Koch spatula through the sideport incision was used to rotate the lens in a clockwise direction until the axis markings of the intraocular lens were lined up with the Verion alignment.  Balanced salt solution was then used to hydrate the wounds. Cefuroxime 0.1 ml of a 10mg /ml solution was injected into the anterior chamber for a dose of 1 mg of intracameral antibiotic at the completion of the case.    The eye was noted to have a physiologic pressure and there was no wound leak noted.   Timolol and Brimonidine drops were applied to the eye.  The patient was taken to the recovery room in stable condition having had no complications of anesthesia or surgery.  Syriana Croslin 04/29/2024, 11:57 AM

## 2024-04-29 NOTE — Transfer of Care (Signed)
 Immediate Anesthesia Transfer of Care Note  Patient: Bridget [name]  Procedure(s) Performed: PHACOEMULSIFICATION, CATARACT, WITH IOL INSERTION 6.66 00:31.6 (Right: Eye)  Patient Location: PACU  Anesthesia Type: MAC  Level of Consciousness: awake, alert  and patient cooperative  Airway and Oxygen Therapy: Patient Spontanous Breathing and Patient connected to supplemental oxygen  Post-op Assessment: Post-op Vital signs reviewed, Patient's Cardiovascular Status Stable, Respiratory Function Stable, Patent Airway and No signs of Nausea or vomiting  Post-op Vital Signs: Reviewed and stable  Complications: No notable events documented.

## 2024-04-29 NOTE — H&P (Signed)
 [facility]   Primary Care Physician:  Tower, Manley Seeds, MD Ophthalmologist: Dr. Annell Kidney  Pre-Procedure History & Physical: HPI:  Bridget [name] is a 68 y.o. female here for ophthalmic surgery.   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   Cancer (HCC)    BASAL CELL   Chronic back pain    COVID-19 08/2021   GERD (gastroesophageal reflux disease)    OCC   Sleep apnea    mild no CPAP   Wears contact lenses     Past Surgical History:  Procedure Laterality Date   ANKLE SURGERY Right    BASAL CELL CARCINOMA EXCISION  12/06/2017   forehead   BREAST BIOPSY Right 02/05/2018   BREAST SURGERY  01/25/2018   Breast biopsy   CESAREAN SECTION  [phone]   hodp pre term labor CS   COLONOSCOPY WITH PROPOFOL  N/A 07/05/2021   Procedure: COLONOSCOPY WITH PROPOFOL ;  Surgeon: Luke Salaam, MD;  Location: [facility] ENDOSCOPY;  Service: Gastroenterology;  Laterality: N/A;   MOHS SURGERY     02/2023   OVARIAN CYST SURGERY  1973   SHOULDER ARTHROSCOPY Right 10/31/2017   Procedure: ARTHROSCOPY SHOULDER WITH LYSIS OF ADHESION, SUBACROMINAL DECOMPRESSION,DISTAL CLAVICLE EXCISION.;  Surgeon: Rande Bushy, MD;  Location: [facility] ORS;  Service: Orthopedics;  Laterality: Right;  SHOULDER ARTHROSCOPY WITH LYSIS AND RESECTION OF ADHESIONS   SHOULDER ARTHROSCOPY WITH OPEN ROTATOR CUFF REPAIR AND DISTAL CLAVICLE ACROMINECTOMY Right 03/19/2017   Procedure: SHOULDER ARTHROSCOPY WITH OPEN ROTATOR CUFF REPAIR AND SUBACROMINAL DECOMPRESSION;  Surgeon: Rande Bushy, MD;  Location: [facility] ORS;  Service: Orthopedics;  Laterality: Right;   TUBAL LIGATION  1999   WRIST GANGLION EXCISION      Prior to Admission medications   Medication Sig Start Date End Date Taking? Authorizing Provider  Acetaminophen  (TYLENOL  8 HOUR PO) Take by mouth.   Yes [provider]  Cholecalciferol 25 MCG (1000 UT) capsule Take 2,000 Units by mouth daily.    Yes  [provider]  Docusate Calcium (STOOL SOFTENER PO) Take 2 tablets by mouth daily.   Yes [provider]  ibuprofen (ADVIL,MOTRIN) 200 MG tablet Take 400 mg by mouth 2 (two) times daily as needed for mild pain.    Yes [provider]  magnesium 30 MG tablet Take 30 mg by mouth daily.   Yes [provider]  MOBIC  15 MG tablet Take 15 mg by mouth daily as needed.   Yes [provider]  Multiple Vitamin (MULTIVITAMIN) capsule Take 1 capsule by mouth daily.   Yes [provider]  Polyethyl Glycol-Propyl Glycol (SYSTANE OP) Apply 2 drops to eye as needed (dry eyes).   Yes [provider]  Probiotic Product (PROBIOTIC ADVANCED PO) Take by mouth as needed.   Yes [provider]  Turmeric 500 MG CAPS Take 1 tablet by mouth daily.   Yes [provider]    Allergies as of 03/06/2024 - Review Complete 04/17/2023  Allergen Reaction Noted   Lorcet [hydrocodone-acetaminophen ] Rash 01/18/2012    Family History  Problem Relation Age of Onset   Arthritis Mother        OA with verterabral fx   Hypertension Mother    Fibrocystic breast disease Mother    Cancer Maternal Grandfather        colon CA   Diabetes Paternal Grandmother     Social History   Socioeconomic History   Marital status: Married  Spouse name: Not on file   Number of children: Not on file   Years of education: Not on file   Highest education level: Not on file  Occupational History   Not on file  Tobacco Use   Smoking status: Never   Smokeless tobacco: Never  Vaping Use   Vaping status: Never Used  Substance and Sexual Activity   Alcohol use: Yes    Alcohol/week: 4.0 standard drinks of alcohol    Types: 4 Glasses of wine per week    Comment: 2-3 nights per week   Drug use: No   Sexual activity: Not on file  Other Topics Concern   Not on file  Social History Narrative   Not on file   Social Drivers of Health   Financial Resource  Strain: Low Risk  (04/16/2023)   Overall Financial Resource Strain (CARDIA)    Difficulty of Paying Living Expenses: Not hard at all  Food Insecurity: No Food Insecurity (04/16/2023)   Hunger Vital Sign    Worried About Running Out of Food in the Last Year: Never true    Ran Out of Food in the Last Year: Never true  Transportation Needs: No Transportation Needs (04/16/2023)   PRAPARE - Administrator, Civil Service (Medical): No    Lack of Transportation (Non-Medical): No  Physical Activity: Sufficiently Active (04/16/2023)   Exercise Vital Sign    Days of Exercise per Week: 2 days    Minutes of Exercise per Session: 120 min  Stress: Stress Concern Present (04/16/2023)   [employer] of Occupational Health - Occupational Stress Questionnaire    Feeling of Stress : Very much  Social Connections: Socially Integrated (04/16/2023)   Social Connection and Isolation Panel [NHANES]    Frequency of Communication with Friends and Family: More than three times a week    Frequency of Social Gatherings with Friends and Family: More than three times a week    Attends Religious Services: More than 4 times per year    Active Member of [employer] or Organizations: Yes    Attends Engineer, structural: More than 4 times per year    Marital Status: Married  Catering manager Violence: Not At Risk (04/16/2023)   Humiliation, Afraid, Rape, and Kick questionnaire    Fear of Current or Ex-Partner: No    Emotionally Abused: No    Physically Abused: No    Sexually Abused: No    Review of Systems: See HPI, otherwise negative ROS  Physical Exam: BP 115/69   Pulse 69   Temp 97.8 F (36.6 C) (Temporal)   Resp 18   Ht 5\' 3"  (1.6 m)   Wt 67.6 kg   SpO2 97%   BMI 26.39 kg/m  General:   Alert,  pleasant and cooperative in NAD Head:  Normocephalic and atraumatic. Lungs:  Clear to auscultation.    Heart:  Regular rate and rhythm.   Impression/Plan: Bridget [name] is here  for ophthalmic surgery.  Risks, benefits, limitations, and alternatives regarding ophthalmic surgery have been reviewed with the patient.  Questions have been answered.  All parties agreeable.   Annell Kidney, MD  04/29/2024, 10:19 AM

## 2024-05-25 DIAGNOSIS — K080 Exfoliation of teeth due to systemic causes: Secondary | ICD-10-CM | POA: Diagnosis not present

## 2024-08-10 ENCOUNTER — Telehealth: Payer: Self-pay | Admitting: *Deleted

## 2024-08-10 NOTE — Telephone Encounter (Signed)
 Please schedule fasting lab appt prior to CPE, PCP will put orders in closer to appt date

## 2024-08-10 NOTE — Telephone Encounter (Signed)
 Copied from CRM #[id number]. Topic: Clinical - Request for Lab/Test Order >> Aug 10, 2024  9:54 AM Henretta I wrote: Reason for CRM: Patient need a lab orders put in prior to physical

## 2024-08-10 NOTE — Telephone Encounter (Signed)
 lvm for pt to call office to schedule appt.

## 2024-08-12 ENCOUNTER — Encounter: Admitting: Family Medicine

## 2024-09-06 ENCOUNTER — Telehealth: Payer: Self-pay | Admitting: Family Medicine

## 2024-09-06 DIAGNOSIS — R7303 Prediabetes: Secondary | ICD-10-CM

## 2024-09-06 DIAGNOSIS — E785 Hyperlipidemia, unspecified: Secondary | ICD-10-CM

## 2024-09-06 NOTE — Telephone Encounter (Signed)
-----   Message from Bridget [name] sent at 08/28/2024  1:23 PM EDT ----- Regarding: Lab Tues 09/08/24 Hello,  Patient is coming in for CPE labs on Tuesday 09/08/24. Can we get orders please.   Thanks

## 2024-09-08 ENCOUNTER — Ambulatory Visit: Payer: Self-pay | Admitting: Family Medicine

## 2024-09-08 ENCOUNTER — Other Ambulatory Visit (INDEPENDENT_AMBULATORY_CARE_PROVIDER_SITE_OTHER)

## 2024-09-08 DIAGNOSIS — R7303 Prediabetes: Secondary | ICD-10-CM

## 2024-09-08 DIAGNOSIS — E785 Hyperlipidemia, unspecified: Secondary | ICD-10-CM | POA: Diagnosis not present

## 2024-09-08 LAB — COMPREHENSIVE METABOLIC PANEL WITH GFR
ALT: 12 U/L (ref 0–35)
AST: 16 U/L (ref 0–37)
Albumin: 4 g/dL (ref 3.5–5.2)
Alkaline Phosphatase: 61 U/L (ref 39–117)
BUN: 17 mg/dL (ref 6–23)
CO2: 30 meq/L (ref 19–32)
Calcium: 9.1 mg/dL (ref 8.4–10.5)
Chloride: 105 meq/L (ref 96–112)
Creatinine, Ser: 0.6 mg/dL (ref 0.40–1.20)
GFR: 92.45 mL/min (ref >60.00)
Glucose, Bld: 95 mg/dL (ref 70–99)
Potassium: 4.3 meq/L (ref 3.5–5.1)
Sodium: 141 meq/L (ref 135–145)
Total Bilirubin: 0.5 mg/dL (ref 0.2–1.2)
Total Protein: 6.2 g/dL (ref 6.0–8.3)

## 2024-09-08 LAB — LIPID PANEL
Cholesterol: 201 mg/dL — ABNORMAL HIGH (ref 0–200)
HDL: 55.1 mg/dL (ref >39.00)
LDL Cholesterol: 128 mg/dL — ABNORMAL HIGH (ref 0–99)
NonHDL: 145.76
Total CHOL/HDL Ratio: 4
Triglycerides: 91 mg/dL (ref 0.0–149.0)
VLDL: 18.2 mg/dL (ref 0.0–40.0)

## 2024-09-08 LAB — TSH: TSH: 1.88 u[IU]/mL (ref 0.35–5.50)

## 2024-09-08 LAB — HEMOGLOBIN A1C: Hgb A1c MFr Bld: 6.3 % (ref 4.6–6.5)

## 2024-09-14 ENCOUNTER — Encounter: Payer: Self-pay | Admitting: Family Medicine

## 2024-09-14 ENCOUNTER — Ambulatory Visit (INDEPENDENT_AMBULATORY_CARE_PROVIDER_SITE_OTHER): Admitting: Family Medicine

## 2024-09-14 VITALS — BP 122/80 | HR 80 | Temp 97.7°F | Ht 61.78 in | Wt 149.8 lb

## 2024-09-14 DIAGNOSIS — Z1231 Encounter for screening mammogram for malignant neoplasm of breast: Secondary | ICD-10-CM | POA: Insufficient documentation

## 2024-09-14 DIAGNOSIS — Z1211 Encounter for screening for malignant neoplasm of colon: Secondary | ICD-10-CM | POA: Diagnosis not present

## 2024-09-14 DIAGNOSIS — R7303 Prediabetes: Secondary | ICD-10-CM | POA: Diagnosis not present

## 2024-09-14 DIAGNOSIS — Z0000 Encounter for general adult medical examination without abnormal findings: Secondary | ICD-10-CM

## 2024-09-14 DIAGNOSIS — M8589 Other specified disorders of bone density and structure, multiple sites: Secondary | ICD-10-CM | POA: Diagnosis not present

## 2024-09-14 DIAGNOSIS — E785 Hyperlipidemia, unspecified: Secondary | ICD-10-CM

## 2024-09-14 DIAGNOSIS — E2839 Other primary ovarian failure: Secondary | ICD-10-CM

## 2024-09-14 NOTE — Progress Notes (Signed)
 Subjective:    Patient ID: Bridget [name], female    DOB: 1955/12/27, 68 y.o.   MRN: [id number]  HPI  Here for health maintenance exam and to review chronic medical problems   Wt Readings from Last 3 Encounters:  09/14/24 149 lb 12.8 oz (67.9 kg)  04/29/24 149 lb (67.6 kg)  04/17/23 151 lb (68.5 kg)   27.59 kg/m  Vitals:   09/14/24 1403  BP: 122/80  Pulse: 80  Temp: 97.7 F (36.5 C)  SpO2: 93%    Immunization History  Administered Date(s) Administered   Influenza Split 10/23/2011   Influenza,inj,Quad PF,6+ Mos 10/07/2016   Influenza-Unspecified 09/25/2015, 09/17/2017, 08/25/2018   PFIZER(Purple Top)SARS-COV-2 Vaccination 12/31/2019, 01/19/2020   PNEUMOCOCCAL CONJUGATE-20 09/04/2021   Td 08/12/2006   Tdap 05/07/2016   Zoster Recombinant(Shingrix) 09/30/2019, 02/16/2020    Health Maintenance Due  Topic Date Due   Hepatitis C Screening  Never done   COVID-19 Vaccine (3 - Pfizer risk series) 02/16/2020   Medicare Annual Wellness (AWV)  04/15/2024   Mammogram  05/29/2024   Flu shot -wants to get next month   Mammogram 05/2023 -due for  Self breast exam-no lumps   Gyn health No problems   Colon cancer screening  Colonoscopy 06/2021 =recommended virtual colonoscopy as they could not get all the way through Pt declines referral for discussion currently   Bone health  Dexa 02/2022  Osteopenia  Falls-none  Fractures-none  Supplements 2000 international units D3    Exercise  [employer] Gardening    Mood    09/14/2024    2:08 PM 04/17/2023    9:59 AM 04/16/2023   11:37 AM 03/14/2023   10:34 AM 01/08/2023    4:14 PM  Depression screen PHQ 2/9  Decreased Interest 0 0 0 0 0  Down, Depressed, Hopeless 0 0 0 0 0  PHQ - 2 Score 0 0 0 0 0  Altered sleeping 0 0 0 1 0  Tired, decreased energy 0 0 0 1 0  Change in appetite 0 0 0 1 0  Feeling bad or failure about yourself  0 0 0 0 0  Trouble concentrating 0 0 0 0 0  Moving slowly or  fidgety/restless 0 0 0 0 0  Suicidal thoughts 0 0 0 0 0  PHQ-9 Score 0 0 0 3 0  Difficult doing work/chores Not difficult at all Not difficult at all Not difficult at all Not difficult at all Not difficult at all    Hyperlipidemia Lab Results  Component Value Date   CHOL 201 (H) 09/08/2024   CHOL 189 04/11/2023   CHOL 207 (H) 05/23/2021   Lab Results  Component Value Date   HDL 55.10 09/08/2024   HDL 55.50 04/11/2023   HDL 54.50 05/23/2021   Lab Results  Component Value Date   LDLCALC 128 (H) 09/08/2024   LDLCALC 117 (H) 04/11/2023   LDLCALC 136 (H) 05/23/2021   Lab Results  Component Value Date   TRIG 91.0 09/08/2024   TRIG 84.0 04/11/2023   TRIG 85.0 05/23/2021   Lab Results  Component Value Date   CHOLHDL 4 09/08/2024   CHOLHDL 3 04/11/2023   CHOLHDL 4 05/23/2021   No results found for: LDLDIRECT  The 10-year ASCVD risk score (Arnett DK, et al., 2019) is: 7%   Values used to calculate the score:     Age: 81 years     Clincally relevant sex: Female     Is Non-Hispanic African American: No  Diabetic: No     Tobacco smoker: No     Systolic Blood Pressure: 122 mmHg     Is BP treated: No     HDL Cholesterol: 55.1 mg/dL     Total Cholesterol: 201 mg/dL   Eats very well  Some pasta  Some white potatoes    Prediabetes Lab Results  Component Value Date   HGBA1C 6.3 09/08/2024   HGBA1C 6.0 04/11/2023   Lab Results  Component Value Date   NA 141 09/08/2024   K 4.3 09/08/2024   CO2 30 09/08/2024   GLUCOSE 95 09/08/2024   BUN 17 09/08/2024   CREATININE 0.60 09/08/2024   CALCIUM 9.1 09/08/2024   GFR 92.45 09/08/2024   GFRNONAA >60 10/31/2017   Lab Results  Component Value Date   ALT 12 09/08/2024   AST 16 09/08/2024   ALKPHOS 61 09/08/2024   BILITOT 0.5 09/08/2024    Lab Results  Component Value Date   TSH 1.88 09/08/2024    Left 2nd toe is bothersome- wore different shoes-may see podiatry     Patient Active Problem List    Diagnosis Date Noted   Encounter for screening mammogram for breast cancer 09/14/2024   Prediabetes 04/02/2023   Sciatica 08/27/2022   Loud snoring 09/04/2021   Colon cancer screening 04/28/2021   Mild hyperlipidemia 05/04/2018   Estrogen deficiency 04/30/2018   Osteopenia 09/30/2013   Encounter for routine gynecological examination 01/18/2012   Routine general medical examination at a health care facility 01/09/2012   History of streptococcal sore throat 12/28/2011   FASCIITIS, PLANTAR 02/06/2010   DEGENERATIVE JOINT DISEASE, MILD 07/19/2009   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   Cancer (HCC)    BASAL CELL   Chronic back pain    COVID-19 08/2021   GERD (gastroesophageal reflux disease)    OCC   Sleep apnea    mild no CPAP   Wears contact lenses    Past Surgical History:  Procedure Laterality Date   ANKLE SURGERY Right    BASAL CELL CARCINOMA EXCISION  12/06/2017   forehead   BREAST BIOPSY Right 02/05/2018   BREAST SURGERY  01/25/2018   Breast biopsy   CATARACT EXTRACTION W/PHACO Right 04/29/2024   Procedure: PHACOEMULSIFICATION, CATARACT, WITH IOL INSERTION 6.66 00:31.6;  Surgeon: Mittie Gaskin, MD;  Location: [facility];  Service: Ophthalmology;  Laterality: Right;   CESAREAN SECTION  [phone]   hodp pre term labor CS   COLONOSCOPY WITH PROPOFOL  N/A 07/05/2021   Procedure: COLONOSCOPY WITH PROPOFOL ;  Surgeon: Therisa Bi, MD;  Location: [facility] ENDOSCOPY;  Service: Gastroenterology;  Laterality: N/A;   MOHS SURGERY     02/2023   OVARIAN CYST SURGERY  1973   SHOULDER ARTHROSCOPY Right 10/31/2017   Procedure: ARTHROSCOPY SHOULDER WITH LYSIS OF ADHESION, SUBACROMINAL DECOMPRESSION,DISTAL CLAVICLE EXCISION.;  Surgeon: Marchia Drivers, MD;  Location: [facility] ORS;  Service: Orthopedics;  Laterality: Right;  SHOULDER ARTHROSCOPY WITH LYSIS AND RESECTION OF ADHESIONS   SHOULDER ARTHROSCOPY WITH OPEN ROTATOR CUFF REPAIR AND  DISTAL CLAVICLE ACROMINECTOMY Right 03/19/2017   Procedure: SHOULDER ARTHROSCOPY WITH OPEN ROTATOR CUFF REPAIR AND SUBACROMINAL DECOMPRESSION;  Surgeon: Drivers Marchia, MD;  Location: [facility] ORS;  Service: Orthopedics;  Laterality: Right;   TUBAL LIGATION  1999   WRIST GANGLION EXCISION     Social History   Tobacco Use   Smoking status: Never   Smokeless tobacco: Never  Vaping Use   Vaping status: Never  Used  Substance Use Topics   Alcohol use: Yes    Alcohol/week: 4.0 standard drinks of alcohol    Types: 4 Glasses of wine per week    Comment: 2-3 nights per week   Drug use: No   Family History  Problem Relation Age of Onset   Arthritis Mother        OA with verterabral fx   Hypertension Mother    Fibrocystic breast disease Mother    Cancer Maternal Grandfather        colon CA   Diabetes Paternal Grandmother    Allergies  Allergen Reactions   Lorcet [Hydrocodone-Acetaminophen ] Rash   Current Outpatient Medications on File Prior to Visit  Medication Sig Dispense Refill   Acetaminophen  (TYLENOL  8 HOUR PO) Take by mouth.     Cholecalciferol 25 MCG (1000 UT) capsule Take 2,000 Units by mouth daily.      Docusate Calcium (STOOL SOFTENER PO) Take 2 tablets by mouth daily.     ibuprofen (ADVIL,MOTRIN) 200 MG tablet Take 400 mg by mouth 2 (two) times daily as needed for mild pain.      magnesium 30 MG tablet Take 30 mg by mouth daily.     MOBIC  15 MG tablet Take 15 mg by mouth daily as needed.     Multiple Vitamin (MULTIVITAMIN) capsule Take 1 capsule by mouth daily.     Polyethyl Glycol-Propyl Glycol (SYSTANE OP) Apply 2 drops to eye as needed (dry eyes).     Probiotic Product (PROBIOTIC ADVANCED PO) Take by mouth as needed.     Turmeric 500 MG CAPS Take 1 tablet by mouth daily.     No current facility-administered medications on file prior to visit.    Review of Systems  Constitutional:  Negative for activity change, appetite change, fatigue, fever and unexpected weight  change.  HENT:  Negative for congestion, ear pain, rhinorrhea, sinus pressure and sore throat.   Eyes:  Negative for pain, redness and visual disturbance.  Respiratory:  Negative for cough, shortness of breath and wheezing.   Cardiovascular:  Negative for chest pain and palpitations.  Gastrointestinal:  Positive for constipation. Negative for abdominal pain, blood in stool and diarrhea.  Endocrine: Negative for polydipsia and polyuria.  Genitourinary:  Negative for dysuria, frequency and urgency.  Musculoskeletal:  Negative for arthralgias, back pain and myalgias.  Skin:  Negative for pallor and rash.  Allergic/Immunologic: Negative for environmental allergies.  Neurological:  Negative for dizziness, syncope and headaches.  Hematological:  Negative for adenopathy. Does not bruise/bleed easily.  Psychiatric/Behavioral:  Negative for decreased concentration and dysphoric mood. The patient is not nervous/anxious.        Objective:   Physical Exam Constitutional:      General: She is not in acute distress.    Appearance: Normal appearance. She is well-developed and normal weight. She is not ill-appearing or diaphoretic.  HENT:     Head: Normocephalic and atraumatic.     Right Ear: Tympanic membrane, ear canal and external ear normal.     Left Ear: Tympanic membrane, ear canal and external ear normal.     Nose: Nose normal. No congestion.     Mouth/Throat:     Mouth: Mucous membranes are moist.     Pharynx: Oropharynx is clear. No posterior oropharyngeal erythema.  Eyes:     General: No scleral icterus.    Extraocular Movements: Extraocular movements intact.     Conjunctiva/sclera: Conjunctivae normal.     Pupils: Pupils are equal,  round, and reactive to light.  Neck:     Thyroid : No thyromegaly.     Vascular: No carotid bruit or JVD.  Cardiovascular:     Rate and Rhythm: Normal rate and regular rhythm.     Pulses: Normal pulses.     Heart sounds: Normal heart sounds.     No  gallop.  Pulmonary:     Effort: Pulmonary effort is normal. No respiratory distress.     Breath sounds: Normal breath sounds. No wheezing.     Comments: Good air exch Chest:     Chest wall: No tenderness.  Abdominal:     General: Bowel sounds are normal. There is no distension or abdominal bruit.     Palpations: Abdomen is soft. There is no mass.     Tenderness: There is no abdominal tenderness.     Hernia: No hernia is present.  Genitourinary:    Comments: Breast exam: No mass, nodules, thickening, tenderness, bulging, retraction, inflamation, nipple discharge or skin changes noted.  No axillary or clavicular LA.     Musculoskeletal:        General: No tenderness. Normal range of motion.     Cervical back: Normal range of motion and neck supple. No rigidity. No muscular tenderness.     Right lower leg: No edema.     Left lower leg: No edema.     Comments: No kyphosis   Lymphadenopathy:     Cervical: No cervical adenopathy.  Skin:    General: Skin is warm and dry.     Coloration: Skin is not pale.     Findings: No erythema or rash.     Comments: Solar lentigines diffusely   Neurological:     Mental Status: She is alert. Mental status is at baseline.     Cranial Nerves: No cranial nerve deficit.     Motor: No abnormal muscle tone.     Coordination: Coordination normal.     Gait: Gait normal.     Deep Tendon Reflexes: Reflexes are normal and symmetric. Reflexes normal.  Psychiatric:        Mood and Affect: Mood normal.        Cognition and Memory: Cognition and memory normal.           Assessment & Plan:   Problem List Items Addressed This Visit       Musculoskeletal and Integument   Osteopenia   Dexa ordered at [facility]  Discussed fall prevention, supplements and exercise for bone density  Encouraged to add more strength building exercise when able          Other   Routine general medical examination at a health care facility - Primary   Reviewed health  habits including diet and exercise and skin cancer prevention Reviewed appropriate screening tests for age  Also reviewed health mt list, fam hx and immunization status , as well as social and family history   See HPI Labs reviewed and ordered Health Maintenance  Topic Date Due   Hepatitis C Screening  Never done   COVID-19 Vaccine (3 - Pfizer risk series) 02/16/2020   Medicare Annual Wellness Visit  04/15/2024   Breast Cancer Screening  05/29/2024   Flu Shot  03/23/2025*   DTaP/Tdap/Td vaccine (3 - Td or Tdap) 05/07/2026   Colon Cancer Screening  07/06/2031   Pneumococcal Vaccine for age over 46  Completed   DEXA scan (bone density measurement)  Completed   Zoster (Shingles) Vaccine  Completed  HPV Vaccine  Aged Out   Meningitis B Vaccine  Aged Out  *Topic was postponed. The date shown is not the original due date.    Declines colon cancer screening in setting of past incomplete colonoscopy  Mammogram and dexa ordered  Discussed fall prevention, supplements and exercise for bone density  PHQ 0       Prediabetes   Lab Results  Component Value Date   HGBA1C 6.3 09/08/2024   HGBA1C 6.0 04/11/2023   This is up slightly  disc imp of low glycemic diet and wt loss to prevent DM2       Mild hyperlipidemia   Disc goals for lipids and reasons to control them Rev last labs with pt Rev low sat fat diet in detail LDL up slightly to 128  Good HDL  ASCVD risk score 7%        Estrogen deficiency   Dexa ordered Pt will call to schedule       Relevant Orders   DG Bone Density   Encounter for screening mammogram for breast cancer   Mammogram ordered  Pt will call to schedule       Relevant Orders   MM 3D SCREENING MAMMOGRAM BILATERAL BREAST   Colon cancer screening   Pt had colonoscopy n 06/2021 with difficult prep (constipation baseline) and also did not get a complete picture  Virtual colonoscopy was recommended at that time Pt has opted out of any screening since  then since she declines colonoscopy  Declines consultation with GI for now No stool changes Still deals with baseline constipation (good produce/fiber and water intake)

## 2024-09-14 NOTE — Assessment & Plan Note (Signed)
 Lab Results  Component Value Date   HGBA1C 6.3 09/08/2024   HGBA1C 6.0 04/11/2023   This is up slightly  disc imp of low glycemic diet and wt loss to prevent DM2

## 2024-09-14 NOTE — Assessment & Plan Note (Signed)
 Disc goals for lipids and reasons to control them Rev last labs with pt Rev low sat fat diet in detail LDL up slightly to 128  Good HDL  ASCVD risk score 7%

## 2024-09-14 NOTE — Assessment & Plan Note (Addendum)
 Pt had colonoscopy n 06/2021 with difficult prep (constipation baseline) and also did not get a complete picture  Virtual colonoscopy was recommended at that time Pt has opted out of any screening since then since she declines colonoscopy  Declines consultation with GI for now No stool changes Still deals with baseline constipation (good produce/fiber and water intake)

## 2024-09-14 NOTE — Assessment & Plan Note (Signed)
 Mammogram ordered Pt will call to schedule

## 2024-09-14 NOTE — Patient Instructions (Addendum)
 Continue exercise/pickleball Add some strength training to your routine, this is important for bone and brain health and can reduce your risk of falls and help your body use insulin properly and regulate weight  Light weights, exercise bands , and internet videos are a good way to start  Yoga (chair or regular), machines , floor exercises or a gym with machines are also good options   For cholesterol Avoid red meat/ fried foods/ egg yolks/ fatty breakfast meats/ butter, cheese and high fat dairy/ and shellfish   To prevent diabetes Try to get most of your carbohydrates from produce (with the exception of white potatoes) and whole grains Eat less bread/pasta/rice/snack foods/cereals/sweets and other items from the middle of the grocery store (processed carbs)  Get your flu shot next month as planned     You have an order for:  [x]   3D Mammogram  [x]   Bone Density     Please call for appointment:   [x]   [facility]  [address] [place] [address]  [phone]  []   [facility])   [address]  [place], [place] [address]  [phone]  []   [facility]      [address] [place], [place]        [phone]         []   [facility]  [address] [place], [place]  [phone]  []  [facility]   [address]   [place], [place] [address]  [phone]  []  [facility]  [address] [place], [place] [address] [phone]    Make sure to wear two piece clothing  No lotions powders or deodorants the day of the appointment Make sure to bring picture ID and insurance card.  Bring list of medications you are currently taking including any supplements.   Schedule your screening mammogram through MyChart!   Select  [place] imaging sites can now be scheduled through MyChart.  Log into your MyChart account.  Go to 'Visit' (or 'Appointments' if  on mobile App) --> Schedule an  Appointment  Under 'Select a Reason for Visit' choose the Mammogram  Screening option.  Complete the pre-visit questions  and select the time and place that  best fits your schedule

## 2024-09-14 NOTE — Assessment & Plan Note (Signed)
 Reviewed health habits including diet and exercise and skin cancer prevention Reviewed appropriate screening tests for age  Also reviewed health mt list, fam hx and immunization status , as well as social and family history   See HPI Labs reviewed and ordered Health Maintenance  Topic Date Due   Hepatitis C Screening  Never done   COVID-19 Vaccine (3 - Pfizer risk series) 02/16/2020   Medicare Annual Wellness Visit  04/15/2024   Breast Cancer Screening  05/29/2024   Flu Shot  03/23/2025*   DTaP/Tdap/Td vaccine (3 - Td or Tdap) 05/07/2026   Colon Cancer Screening  07/06/2031   Pneumococcal Vaccine for age over 74  Completed   DEXA scan (bone density measurement)  Completed   Zoster (Shingles) Vaccine  Completed   HPV Vaccine  Aged Out   Meningitis B Vaccine  Aged Out  *Topic was postponed. The date shown is not the original due date.    Declines colon cancer screening in setting of past incomplete colonoscopy  Mammogram and dexa ordered  Discussed fall prevention, supplements and exercise for bone density  PHQ 0

## 2024-09-14 NOTE — Assessment & Plan Note (Signed)
 Dexa ordered at [facility]  Discussed fall prevention, supplements and exercise for bone density  Encouraged to add more strength building exercise when able

## 2024-09-14 NOTE — Assessment & Plan Note (Signed)
Dexa ordered Pt will call to schedule

## 2024-09-28 DIAGNOSIS — H2512 Age-related nuclear cataract, left eye: Secondary | ICD-10-CM | POA: Diagnosis not present

## 2024-09-28 DIAGNOSIS — H43813 Vitreous degeneration, bilateral: Secondary | ICD-10-CM | POA: Diagnosis not present

## 2024-09-28 DIAGNOSIS — H179 Unspecified corneal scar and opacity: Secondary | ICD-10-CM | POA: Diagnosis not present

## 2024-09-28 DIAGNOSIS — Z961 Presence of intraocular lens: Secondary | ICD-10-CM | POA: Diagnosis not present

## 2024-09-30 ENCOUNTER — Ambulatory Visit
Admission: EM | Admit: 2024-09-30 | Discharge: 2024-09-30 | Disposition: A | Discharge status: Home / Self Care | Attending: Emergency Medicine | Admitting: Emergency Medicine

## 2024-09-30 DIAGNOSIS — U071 COVID-19: Secondary | ICD-10-CM | POA: Diagnosis not present

## 2024-09-30 LAB — POCT RAPID STREP A (OFFICE): Rapid Strep A Screen: NEGATIVE

## 2024-09-30 LAB — POC SOFIA SARS ANTIGEN FIA: SARS Coronavirus 2 Ag: POSITIVE — AB

## 2024-09-30 NOTE — ED Provider Notes (Signed)
 [name]    CSN: [id number] Arrival date & time: 09/30/24  1351      History   Chief Complaint Chief Complaint  Patient presents with   Fever   Sore Throat    HPI Bridget [name] is a 68 y.o. female.  Patient presents with 3-day history of fever, sore throat, hoarse voice, cough.  Tmax 100.8.  She has been treating her symptoms with OTC cold medication.  No shortness of breath, vomiting, diarrhea.  The history is provided by the patient and medical records.    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   Cancer (HCC)    BASAL CELL   Chronic back pain    COVID-19 08/2021   GERD (gastroesophageal reflux disease)    OCC   Sleep apnea    mild no CPAP   Wears contact lenses     Patient Active Problem List   Diagnosis Date Noted   Encounter for screening mammogram for breast cancer 09/14/2024   Prediabetes 04/02/2023   Sciatica 08/27/2022   Loud snoring 09/04/2021   Colon cancer screening 04/28/2021   Mild hyperlipidemia 05/04/2018   Estrogen deficiency 04/30/2018   Osteopenia 09/30/2013   Encounter for routine gynecological examination 01/18/2012   Routine general medical examination at a health care facility 01/09/2012   History of streptococcal sore throat 12/28/2011   FASCIITIS, PLANTAR 02/06/2010   DEGENERATIVE JOINT DISEASE, MILD 07/19/2009    Past Surgical History:  Procedure Laterality Date   ANKLE SURGERY Right    BASAL CELL CARCINOMA EXCISION  12/06/2017   forehead   BREAST BIOPSY Right 02/05/2018   BREAST SURGERY  01/25/2018   Breast biopsy   CATARACT EXTRACTION W/PHACO Right 04/29/2024   Procedure: PHACOEMULSIFICATION, CATARACT, WITH IOL INSERTION 6.66 00:31.6;  Surgeon: Mittie Gaskin, MD;  Location: [facility];  Service: Ophthalmology;  Laterality: Right;   CESAREAN SECTION  [phone]   hodp pre term labor CS   COLONOSCOPY WITH PROPOFOL  N/A 07/05/2021   Procedure:  COLONOSCOPY WITH PROPOFOL ;  Surgeon: Therisa Bi, MD;  Location: [facility];  Service: Gastroenterology;  Laterality: N/A;   MOHS SURGERY     02/2023   OVARIAN CYST SURGERY  1973   SHOULDER ARTHROSCOPY Right 10/31/2017   Procedure: ARTHROSCOPY SHOULDER WITH LYSIS OF ADHESION, SUBACROMINAL DECOMPRESSION,DISTAL CLAVICLE EXCISION.;  Surgeon: Marchia Drivers, MD;  Location: [facility];  Service: Orthopedics;  Laterality: Right;  SHOULDER ARTHROSCOPY WITH LYSIS AND RESECTION OF ADHESIONS   SHOULDER ARTHROSCOPY WITH OPEN ROTATOR CUFF REPAIR AND DISTAL CLAVICLE ACROMINECTOMY Right 03/19/2017   Procedure: SHOULDER ARTHROSCOPY WITH OPEN ROTATOR CUFF REPAIR AND SUBACROMINAL DECOMPRESSION;  Surgeon: Drivers Marchia, MD;  Location: [facility];  Service: Orthopedics;  Laterality: Right;   TUBAL LIGATION  1999   WRIST GANGLION EXCISION      OB History   No obstetric history on file.      Home Medications    Prior to Admission medications   Medication Sig Start Date End Date Taking? Authorizing Provider  Acetaminophen  (TYLENOL  8 HOUR PO) Take by mouth.    [provider]  Cholecalciferol 25 MCG (1000 UT) capsule Take 2,000 Units by mouth daily.     [provider]  Docusate Calcium (STOOL SOFTENER PO) Take 2 tablets by mouth daily.    [provider]  ibuprofen (ADVIL,MOTRIN) 200 MG tablet Take 400 mg by mouth 2 (two) times daily as needed for mild pain.  [provider]  magnesium 30 MG tablet Take 30 mg by mouth daily.    [provider]  MOBIC  15 MG tablet Take 15 mg by mouth daily as needed.    [provider]  Multiple Vitamin (MULTIVITAMIN) capsule Take 1 capsule by mouth daily.    [provider]  Polyethyl Glycol-Propyl Glycol (SYSTANE OP) Apply 2 drops to eye as needed (dry eyes).    [provider]  Probiotic Product (PROBIOTIC ADVANCED PO) Take by mouth as needed.    [provider]  Turmeric 500 MG CAPS  Take 1 tablet by mouth daily.    [provider]    Family History Family History  Problem Relation Age of Onset   Arthritis Mother        OA with verterabral fx   Hypertension Mother    Fibrocystic breast disease Mother    Cancer Maternal Grandfather        colon CA   Diabetes Paternal Grandmother     Social History Social History   Tobacco Use   Smoking status: Never   Smokeless tobacco: Never  Vaping Use   Vaping status: Never Used  Substance Use Topics   Alcohol use: Yes    Alcohol/week: 4.0 standard drinks of alcohol    Types: 4 Glasses of wine per week    Comment: 2-3 nights per week   Drug use: No     Allergies   Lorcet [hydrocodone-acetaminophen ]   Review of Systems Review of Systems  Constitutional:  Positive for fever. Negative for chills.  HENT:  Positive for sore throat and voice change. Negative for ear pain.   Respiratory:  Positive for cough. Negative for shortness of breath.   Cardiovascular:  Negative for chest pain and palpitations.  Gastrointestinal:  Negative for diarrhea and vomiting.     Physical Exam Triage Vital Signs ED Triage Vitals [09/30/24 1403]  Encounter Vitals Group     BP 120/74     Girls Systolic BP Percentile      Girls Diastolic BP Percentile      Boys Systolic BP Percentile      Boys Diastolic BP Percentile      Pulse Rate 84     Resp 18     Temp 98.9 F (37.2 C)     Temp src      SpO2 95 %     Weight      Height      Head Circumference      Peak Flow      Pain Score 5     Pain Loc      Pain Education      Exclude from Growth Chart    No data found.  Updated Vital Signs BP 120/74   Pulse 84   Temp 98.9 F (37.2 C)   Resp 18   SpO2 95%   Visual Acuity Right Eye Distance:   Left Eye Distance:   Bilateral Distance:    Right Eye Near:   Left Eye Near:    Bilateral Near:     Physical Exam Constitutional:      General: She is not in acute distress. HENT:     Right Ear: Tympanic  membrane normal.     Left Ear: Tympanic membrane normal.     Nose: Nose normal.     Mouth/Throat:     Mouth: Mucous membranes are moist.     Pharynx: Posterior oropharyngeal erythema present.  Cardiovascular:  Rate and Rhythm: Normal rate and regular rhythm.     Heart sounds: Normal heart sounds.  Pulmonary:     Effort: Pulmonary effort is normal. No respiratory distress.     Breath sounds: Normal breath sounds.  Neurological:     Mental Status: She is alert.      UC Treatments / Results  Labs (all labs ordered are listed, but only abnormal results are displayed) Labs Reviewed  POC SOFIA SARS ANTIGEN FIA - Abnormal; Notable for the following components:      Result Value   SARS Coronavirus 2 Ag Positive (*)    All other components within normal limits  POCT RAPID STREP A (OFFICE) - Normal    EKG   Radiology No results found.  Procedures Procedures (including critical care time)  Medications Ordered in UC Medications - No data to display  Initial Impression / Assessment and Plan / UC Course  I have reviewed the triage vital signs and the nursing notes.  Pertinent labs & imaging results that were available during my care of the patient were reviewed by me and considered in my medical decision making (see chart for details).    COVID-19.  Afebrile and vital signs are stable.  Lungs are clear and O2 sat is 95% on room air.  Rapid strep negative.  COVID positive.  Patient declines treatment with COVID antiviral as she was unable to tolerate it in the past due to side effects.  Discussed symptomatic treatment including Tylenol  or ibuprofen as needed, plain Mucinex  as needed, rest, hydration.  Education provided on COVID.  Instructed patient to follow-up with her PCP.  ED precautions discussed.  She agrees to plan of care.   Final Clinical Impressions(s) / UC Diagnoses   Final diagnoses:  COVID-19     Discharge Instructions      The COVID test is positive.  The  strep test is negative.   Take Tylenol  or ibuprofen as needed for fever or discomfort.  Take plain Mucinex  as needed for congestion.  Rest and keep yourself hydrated.    Follow up with your primary care provider.  Go to the emergency department if you have worsening symptoms.        ED Prescriptions   None    PDMP not reviewed this encounter.   Corlis Burnard DEL, NP 09/30/24 [phone]

## 2024-09-30 NOTE — ED Triage Notes (Signed)
 Patient to Urgent Care with complaints of fevers (100.8 max) / sore throat/ dr cough/ hoarseness  Symptoms started Sunday night. Started running fevers on Monday.   Meds: ibuprofen/ cold and flu otc meds (w/ tylenol ).

## 2024-09-30 NOTE — Discharge Instructions (Addendum)
 The COVID test is positive.  The strep test is negative.   Take Tylenol  or ibuprofen as needed for fever or discomfort.  Take plain Mucinex  as needed for congestion.  Rest and keep yourself hydrated.    Follow up with your primary care provider.  Go to the emergency department if you have worsening symptoms.

## 2024-10-02 ENCOUNTER — Telehealth: Admitting: Family Medicine

## 2024-10-02 ENCOUNTER — Ambulatory Visit: Payer: Self-pay

## 2024-10-02 DIAGNOSIS — U071 COVID-19: Secondary | ICD-10-CM

## 2024-10-02 MED ORDER — AZITHROMYCIN 250 MG PO TABS
ORAL_TABLET | ORAL | 0 refills | Status: AC
Start: 2024-10-02 — End: 2024-10-07

## 2024-10-02 NOTE — Patient Instructions (Signed)
 COVID-19: What to Know COVID-19 is an infection caused by a virus called SARS-CoV-2. This type of virus is called a coronavirus. People with COVID-19 may: Have few to no symptoms. Have mild to moderate symptoms that affect their lungs and breathing. Get very sick. What are the causes?  COVID-19 is caused by a virus. This virus may be in the air as droplets or on surfaces. It can spread from an infected person when they cough, sneeze, speak, sing, or breathe. You may become infected if: You breathe in the infected droplets in the air. You touch an object that has the virus on it. What increases the risk? You are at risk of getting COVID-19 if you have been around someone with the infection. You may be more likely to get very sick if: You are 57 years old or older. You have certain medical conditions, such as: Heart disease. Diabetes. Long-term respiratory disease. Cancer. Pregnancy. You are immunocompromised. This means your body can't fight infections easily. You have a disability that makes it hard for you to move around, you have trouble moving, or you can't move at all. What are the signs or symptoms? People may have different symptoms from COVID-19. The symptoms can also be mild to very bad. They often show up in 5-6 days after being infected. But, they can take up to 14 days to appear. Common symptoms are: Cough. Feeling tired. New loss of taste or smell. Fever. Less common symptoms are: Sore throat. Headache. Body or muscle aches. Diarrhea. A skin rash or fingers or toes that are a different color than usual. Red or irritated eyes. Sometimes, COVID-19 does not cause symptoms. How is this diagnosed? COVID-19 can be diagnosed with tests done in the lab or at home. Fluid from your nose, mouth, or lungs will be used to check for the virus. How is this treated? Treatment for COVID-19 depends on how sick you are. Mild symptoms can be treated at home with rest, fluids, and  over-the-counter medicines. very bad symptoms may be treated in a hospital intensive care unit (ICU). If you have symptoms and are at risk of getting very sick, you may be given a medicine that fights viruses. This medicine is called an antiviral. How is this prevented? To protect yourself from COVID-19: Know your risk factors. Get vaccinated. If your body can't fight infections easily, talk to your provider about treatment to help prevent COVID-19. Stay at least about 3 feet (1 meter) away from other people. Wear mask that fits well when: You can't stay at a distance from people. You're in a place with not a lot of air flow. Try to be in open spaces with good air flow when you are in public. Wash your hands often or use an alcohol-based hand sanitizer. Cover your nose and mouth when you cough or sneeze. If you think you have COVID-19 or have been around someone who has it, stay home and away from other people as told by your provider or health officials. Where to find more information To learn more: Go to [employer]erProm[employer].no Click Health Topics. Type COVID-19 in the search box. Go to VisitDestination.com.br Click Health Topics. Then click All Topics. Type COVID-19 in the search box. Get help right away if: You have trouble breathing or get short of breath. You have pain or pressure in your chest. You're feeling confused. These symptoms may be an emergency. Get help right away. Call 911. Do not wait to see if the symptoms will go away.  Do not drive yourself to the hospital. This information is not intended to replace advice given to you by your health care provider. Make sure you discuss any questions you have with your health care provider. Document Revised: 09/12/2023 Document Reviewed: 09/04/2023 Elsevier Patient Education  2025 [employer].

## 2024-10-02 NOTE — Telephone Encounter (Signed)
 Aware, will watch for correspondence

## 2024-10-02 NOTE — Progress Notes (Signed)
 Virtual Visit Consent   Bridget [name], you are scheduled for a virtual visit with a [facility] provider today. Just as with appointments in the office, your consent must be obtained to participate. Your consent will be active for this visit and any virtual visit you may have with one of our providers in the next 365 days. If you have a MyChart account, a copy of this consent can be sent to you electronically.  As this is a virtual visit, video technology does not allow for your provider to perform a traditional examination. This may limit your provider's ability to fully assess your condition. If your provider identifies any concerns that need to be evaluated in person or the need to arrange testing (such as labs, EKG, etc.), we will make arrangements to do so. Although advances in technology are sophisticated, we cannot ensure that it will always work on either your end or our end. If the connection with a video visit is poor, the visit may have to be switched to a telephone visit. With either a video or telephone visit, we are not always able to ensure that we have a secure connection.  By engaging in this virtual visit, you consent to the provision of healthcare and authorize for your insurance to be billed (if applicable) for the services provided during this visit. Depending on your insurance coverage, you may receive a charge related to this service.  I need to obtain your verbal consent now. Are you willing to proceed with your visit today? Bridget [name] has provided verbal consent on 10/02/2024 for a virtual visit (video or telephone). Bridget Lamp, FNP  Date: 10/02/2024 9:22 AM   Virtual Visit via Video Note   I, Bridget [name], connected with  Bridget [name]  ([id number], Apr 28, 1956) on 10/02/24 at  9:15 AM EDT by a video-enabled telemedicine application and verified that I am speaking with the correct person using two identifiers.  Location: Patient:  Virtual Visit Location Patient: Home Provider: Virtual Visit Location Provider: Home Office   I discussed the limitations of evaluation and management by telemedicine and the availability of in person appointments. The patient expressed understanding and agreed to proceed.    History of Present Illness: Bridget [name] is a 68 y.o. who identifies as a female who was assigned female at birth, and is being seen today for covid since Monday, see at [facility] Wednesday and tested positive. Sx persistent. Cough, sore throat, head congestion, no wheezing or sob. Fever since Tuesday. SABRA  HPI: HPI  Problems:  Patient Active Problem List   Diagnosis Date Noted   Encounter for screening mammogram for breast cancer 09/14/2024   Prediabetes 04/02/2023   Sciatica 08/27/2022   Loud snoring 09/04/2021   Colon cancer screening 04/28/2021   Mild hyperlipidemia 05/04/2018   Estrogen deficiency 04/30/2018   Osteopenia 09/30/2013   Encounter for routine gynecological examination 01/18/2012   Routine general medical examination at a health care facility 01/09/2012   History of streptococcal sore throat 12/28/2011   FASCIITIS, PLANTAR 02/06/2010   DEGENERATIVE JOINT DISEASE, MILD 07/19/2009    Allergies:  Allergies  Allergen Reactions   Lorcet [Hydrocodone-Acetaminophen ] Rash   Medications:  Current Outpatient Medications:    azithromycin  (ZITHROMAX ) 250 MG tablet, Take 2 tablets on day 1, then 1 tablet daily on days 2 through 5, Disp: 6 tablet, Rfl: 0   Acetaminophen  (TYLENOL  8 HOUR PO), Take by mouth., Disp: , Rfl:    Cholecalciferol 25 MCG (1000 UT)  capsule, Take 2,000 Units by mouth daily. , Disp: , Rfl:    Docusate Calcium (STOOL SOFTENER PO), Take 2 tablets by mouth daily., Disp: , Rfl:    ibuprofen (ADVIL,MOTRIN) 200 MG tablet, Take 400 mg by mouth 2 (two) times daily as needed for mild pain. , Disp: , Rfl:    magnesium 30 MG tablet, Take 30 mg by mouth daily., Disp: , Rfl:    MOBIC  15 MG  tablet, Take 15 mg by mouth daily as needed., Disp: , Rfl:    Multiple Vitamin (MULTIVITAMIN) capsule, Take 1 capsule by mouth daily., Disp: , Rfl:    Polyethyl Glycol-Propyl Glycol (SYSTANE OP), Apply 2 drops to eye as needed (dry eyes)., Disp: , Rfl:    Probiotic Product (PROBIOTIC ADVANCED PO), Take by mouth as needed., Disp: , Rfl:    Turmeric 500 MG CAPS, Take 1 tablet by mouth daily., Disp: , Rfl:   Observations/Objective: Patient is well-developed, well-nourished in no acute distress.  Resting comfortably  at home.  Head is normocephalic, atraumatic.  No labored breathing.  Speech is clear and coherent with logical content.  Patient is alert and oriented at baseline.    Assessment and Plan: 1. COVID (Primary)  Increase fluids humidifier at night, tylenol  or ibuprofen as directed, UC if sx worsen.   Follow Up Instructions: I discussed the assessment and treatment plan with the patient. The patient was provided an opportunity to ask questions and all were answered. The patient agreed with the plan and demonstrated an understanding of the instructions.  A copy of instructions were sent to the patient via MyChart unless otherwise noted below.     The patient was advised to call back or seek an in-person evaluation if the symptoms worsen or if the condition fails to improve as anticipated.    Alsace Dowd, FNP

## 2024-10-02 NOTE — Telephone Encounter (Signed)
 FYI Only or Action Required?: FYI only for provider.  Patient was last seen in primary care on 09/14/2024 by Bridget Laine LABOR, MD.  Called Nurse Triage reporting Covid Positive.  Symptoms began several days ago.  Interventions attempted: OTC medications: Tylenol , Mucenex.  Symptoms are: gradually worsening. - now productive cough  Triage Disposition: Call PCP Within 24 Hours  Patient/caregiver understands and will follow disposition?: Yes - MyChart VV for this morning                     Copied from CRM #[id number]. Topic: Clinical - Red Word Triage >> Oct 02, 2024  8:09 AM Bridget [name] wrote: Bridget [name] that prompted transfer to Nurse Triage: high fever, still feeling unwell from covid.Bridget [name] temp 100.8 with sore throat, coughing up green  ( Looking for zpack? ) Reason for Disposition  Fever present > 3 days (72 hours)  Answer Assessment - Initial Assessment Questions 1. SYMPTOMS: What is your main symptom or concern? (e.[name]., cough, fever, shortness of breath, muscle aches)     Cough, sore throat, fever 100.5 - 101 red throat 2. ONSET: When did the symptoms start?      Sunday 3. COUGH: Do you have a cough? If Yes, ask: How bad is the cough?       yes 4. FEVER: Do you have a fever? If Yes, ask: What is your temperature, how was it measured, and when did it start?     10 1 is the highest 5. BREATHING DIFFICULTY: Are you having any difficulty breathing? (e.[name]., normal; shortness of breath, wheezing, unable to speak)      no 6. BETTER-SAME-WORSE: Are you getting better, staying the same or getting worse compared to yesterday?  If getting worse, ask, In what way?     same 7. OTHER SYMPTOMS: Do you have any other symptoms?  (e.[name]., chills, fatigue, headache, loss of smell or taste, muscle pain, sore throat)     HA, chills, Sinus  8. COVID-19 DIAGNOSIS: How do you know that you have COVID? (e.[name]., positive lab test or self-test, diagnosed by doctor or NP/PA,  symptoms after exposure).     Yes - UC 9. COVID-19 EXPOSURE: Was there any known exposure to COVID before the symptoms began?      no  11. HIGH RISK DISEASE: Do you have any chronic medical problems? (e.[name]., asthma, heart or lung disease, weak immune system, obesity, etc.)       no  Protocols used: COVID-19 - Diagnosed or Suspected-A-AH

## 2024-10-22 ENCOUNTER — Ambulatory Visit
Admission: EM | Admit: 2024-10-22 | Discharge: 2024-10-22 | Disposition: A | Discharge status: Home / Self Care | Attending: Emergency Medicine | Admitting: Emergency Medicine

## 2024-10-22 ENCOUNTER — Encounter: Payer: Self-pay | Admitting: Emergency Medicine

## 2024-10-22 DIAGNOSIS — J069 Acute upper respiratory infection, unspecified: Secondary | ICD-10-CM

## 2024-10-22 MED ORDER — AMOXICILLIN-POT CLAVULANATE 875-125 MG PO TABS: 1.0000 | ORAL_TABLET | Freq: Two times a day (BID) | ORAL | 0 refills | Status: AC

## 2024-10-22 MED ORDER — LIDOCAINE VISCOUS HCL 2 % MT SOLN: 15.0000 mL | OROMUCOSAL | 0 refills | Status: AC | PRN

## 2024-10-22 MED ORDER — BENZONATATE 100 MG PO CAPS: 100.0000 mg | ORAL_CAPSULE | Freq: Three times a day (TID) | ORAL | 0 refills | Status: AC

## 2024-10-22 NOTE — ED Triage Notes (Signed)
 Patient was seen here at [facility] and was diagnosed with Covid on 09/30/24. Patient has had a dry cough and sore throat since 09/30/24. Patient has taken OTC cold and cough pills, Z-pak, and Benzonatate  with no relief. Rates sore throat 7/10. Patient states she is going out of town  with a group and wants to make sure she does not have anything she can pass to others.

## 2024-10-22 NOTE — Discharge Instructions (Signed)
 Today you were evaluated for your cough and sore throat which I believe are residual from your recent COVID-19 infection, while symptoms started off as a virus and I believe them to be bacterial at this time as they have not resolved on their own  Begin Augmentin twice daily for 7 days for treatment  You may use Tessalon  pill every 8 hours as needed for cough  May gargle and spit lidocaine  solution every 4 hours as needed for temporary relief to your throat pain    You can take Tylenol  and/or Ibuprofen as needed for fever reduction and pain relief.   For cough: honey 1/2 to 1 teaspoon (you can dilute the honey in water or another fluid).  You can also use guaifenesin  and dextromethorphan for cough. You can use a humidifier for chest congestion and cough.  If you don't have a humidifier, you can sit in the bathroom with the hot shower running.      For sore throat: try warm salt water gargles, cepacol lozenges, throat spray, warm tea or water with lemon/honey, popsicles or ice, or OTC cold relief medicine for throat discomfort.   For congestion: take a daily anti-histamine like Zyrtec, Claritin, and a oral decongestant, such as pseudoephedrine.  You can also use Flonase 1-2 sprays in each nostril daily.   It is important to stay hydrated: drink plenty of fluids (water, gatorade/powerade/pedialyte, juices, or teas) to keep your throat moisturized and help further relieve irritation/discomfort.

## 2024-10-22 NOTE — ED Provider Notes (Signed)
 Bridget [name]    CSN: [id number] Arrival date & time: 10/22/24  9045      History   Chief Complaint Chief Complaint  Patient presents with   Cough   Sore Throat    HPI Bridget [name] is a 68 y.o. female.   Patient presents for evaluation of increased fatigue, nonproductive cough and sore throat persisting for 3 weeks.  Diagnosed with COVID-19 on 09/30/2024, experienced fever for 1 week before resolution.  Completed e-visit on 10/02/2024 which she endorsed to your primary doctor that sore throat was not improving and given azithromycin , no change in symptoms.  Additionally has attempted over-the-counter cold and cough medicine, gargle with salt water and taking Tessalon .  Tolerable to food and liquids.  Denies shortness of breath, wheezing.  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   Cancer (HCC)    BASAL CELL   Chronic back pain    COVID-19 08/2021   GERD (gastroesophageal reflux disease)    OCC   Sleep apnea    mild no CPAP   Wears contact lenses     Patient Active Problem List   Diagnosis Date Noted   Encounter for screening mammogram for breast cancer 09/14/2024   Prediabetes 04/02/2023   Sciatica 08/27/2022   Loud snoring 09/04/2021   Colon cancer screening 04/28/2021   Mild hyperlipidemia 05/04/2018   Estrogen deficiency 04/30/2018   Osteopenia 09/30/2013   Encounter for routine gynecological examination 01/18/2012   Routine general medical examination at a health care facility 01/09/2012   History of streptococcal sore throat 12/28/2011   FASCIITIS, PLANTAR 02/06/2010   DEGENERATIVE JOINT DISEASE, MILD 07/19/2009    Past Surgical History:  Procedure Laterality Date   ANKLE SURGERY Right    BASAL CELL CARCINOMA EXCISION  12/06/2017   forehead   BREAST BIOPSY Right 02/05/2018   BREAST SURGERY  01/25/2018   Breast biopsy   CATARACT EXTRACTION W/PHACO Right 04/29/2024   Procedure: PHACOEMULSIFICATION,  CATARACT, WITH IOL INSERTION 6.66 00:31.6;  Surgeon: Mittie Gaskin, MD;  Location: [facility];  Service: Ophthalmology;  Laterality: Right;   CESAREAN SECTION  [phone]   hodp pre term labor CS   COLONOSCOPY WITH PROPOFOL  N/A 07/05/2021   Procedure: COLONOSCOPY WITH PROPOFOL ;  Surgeon: Therisa Bi, MD;  Location: [facility];  Service: Gastroenterology;  Laterality: N/A;   MOHS SURGERY     02/2023   OVARIAN CYST SURGERY  1973   SHOULDER ARTHROSCOPY Right 10/31/2017   Procedure: ARTHROSCOPY SHOULDER WITH LYSIS OF ADHESION, SUBACROMINAL DECOMPRESSION,DISTAL CLAVICLE EXCISION.;  Surgeon: Marchia Drivers, MD;  Location: [facility];  Service: Orthopedics;  Laterality: Right;  SHOULDER ARTHROSCOPY WITH LYSIS AND RESECTION OF ADHESIONS   SHOULDER ARTHROSCOPY WITH OPEN ROTATOR CUFF REPAIR AND DISTAL CLAVICLE ACROMINECTOMY Right 03/19/2017   Procedure: SHOULDER ARTHROSCOPY WITH OPEN ROTATOR CUFF REPAIR AND SUBACROMINAL DECOMPRESSION;  Surgeon: Drivers Marchia, MD;  Location: [facility];  Service: Orthopedics;  Laterality: Right;   TUBAL LIGATION  1999   WRIST GANGLION EXCISION      OB History   No obstetric history on file.      Home Medications    Prior to Admission medications   Medication Sig Start Date End Date Taking? Authorizing Provider  amoxicillin -clavulanate (AUGMENTIN) 875-125 MG tablet Take 1 tablet by mouth every 12 (twelve) hours. 10/22/24  Yes Jeramine Delis R, NP  benzonatate  (TESSALON ) 100 MG capsule Take 1 capsule (100 mg total) by mouth every 8 (eight)  hours. 10/22/24  Yes Lanai Conlee R, NP  lidocaine  (XYLOCAINE ) 2 % solution Use as directed 15 mLs in the mouth or throat every 4 (four) hours as needed for mouth pain. 10/22/24  Yes Johnathan Heskett R, NP  Acetaminophen  (TYLENOL  8 HOUR PO) Take by mouth.    [provider]  Cholecalciferol 25 MCG (1000 UT) capsule Take 2,000 Units by mouth daily.     [provider]  Docusate Calcium  (STOOL SOFTENER PO) Take 2 tablets by mouth daily.    [provider]  ibuprofen (ADVIL,MOTRIN) 200 MG tablet Take 400 mg by mouth 2 (two) times daily as needed for mild pain.     [provider]  magnesium 30 MG tablet Take 30 mg by mouth daily.    [provider]  MOBIC  15 MG tablet Take 15 mg by mouth daily as needed.    [provider]  Multiple Vitamin (MULTIVITAMIN) capsule Take 1 capsule by mouth daily.    [provider]  Polyethyl Glycol-Propyl Glycol (SYSTANE OP) Apply 2 drops to eye as needed (dry eyes).    [provider]  Probiotic Product (PROBIOTIC ADVANCED PO) Take by mouth as needed.    [provider]  Turmeric 500 MG CAPS Take 1 tablet by mouth daily.    [provider]    Family History Family History  Problem Relation Age of Onset   Arthritis Mother        OA with verterabral fx   Hypertension Mother    Fibrocystic breast disease Mother    Cancer Maternal Grandfather        colon CA   Diabetes Paternal Grandmother     Social History Social History   Tobacco Use   Smoking status: Never   Smokeless tobacco: Never  Vaping Use   Vaping status: Never Used  Substance Use Topics   Alcohol use: Yes    Alcohol/week: 4.0 standard drinks of alcohol    Types: 4 Glasses of wine per week    Comment: 2-3 nights per week   Drug use: No     Allergies   Lorcet [hydrocodone-acetaminophen ]   Review of Systems Review of Systems   Physical Exam Triage Vital Signs ED Triage Vitals  Encounter Vitals Group     BP 10/22/24 1037 116/76     Girls Systolic BP Percentile --      Girls Diastolic BP Percentile --      Boys Systolic BP Percentile --      Boys Diastolic BP Percentile --      Pulse Rate 10/22/24 1037 79     Resp 10/22/24 1037 18     Temp 10/22/24 1037 98.5 F (36.9 C)     Temp Source 10/22/24 1037 Oral     SpO2 10/22/24 1037 95 %     Weight --      Height --      Head  Circumference --      Peak Flow --      Pain Score 10/22/24 1033 7     Pain Loc --      Pain Education --      Exclude from Growth Chart --    No data found.  Updated Vital Signs BP 116/76 (BP Location: Left Arm)   Pulse 79   Temp 98.5 F (36.9 C) (Oral)   Resp 18   SpO2 95%   Visual Acuity Right Eye Distance:   Left Eye Distance:  Bilateral Distance:    Right Eye Near:   Left Eye Near:    Bilateral Near:     Physical Exam Constitutional:      Appearance: Normal appearance.  HENT:     Right Ear: Tympanic membrane and ear canal normal.     Left Ear: Tympanic membrane and ear canal normal.     Nose: Congestion present.     Mouth/Throat:     Pharynx: Posterior oropharyngeal erythema present. No oropharyngeal exudate.  Cardiovascular:     Rate and Rhythm: Normal rate and regular rhythm.     Pulses: Normal pulses.     Heart sounds: Normal heart sounds.  Pulmonary:     Effort: Pulmonary effort is normal.     Breath sounds: Normal breath sounds.  Neurological:     Mental Status: She is alert and oriented to person, place, and time. Mental status is at baseline.      UC Treatments / Results  Labs (all labs ordered are listed, but only abnormal results are displayed) Labs Reviewed - No data to display  EKG   Radiology No results found.  Procedures Procedures (including critical care time)  Medications Ordered in UC Medications - No data to display  Initial Impression / Assessment and Plan / UC Course  I have reviewed the triage vital signs and the nursing notes.  Pertinent labs & imaging results that were available during my care of the patient were reviewed by me and considered in my medical decision making (see chart for details).  Acute URI  Patient is in no signs of distress nor toxic appearing.  Vital signs are stable.  Low suspicion for pneumonia, pneumothorax or bronchitis and therefore will defer imaging.  Symptoms persisting for 3 weeks at this  time believed to be bacterial, discussed this with patient, prescribed Augmentin, Tessalon  and viscous lidocaine .May use additional over-the-counter medications as needed for supportive care.  May follow-up with urgent care as needed if symptoms persist or worsen.   Final Clinical Impressions(s) / UC Diagnoses   Final diagnoses:  Acute URI     Discharge Instructions      Today you were evaluated for your cough and sore throat which I believe are residual from your recent COVID-19 infection, while symptoms started off as a virus and I believe them to be bacterial at this time as they have not resolved on their own  Begin Augmentin twice daily for 7 days for treatment  You may use Tessalon  pill every 8 hours as needed for cough  May gargle and spit lidocaine  solution every 4 hours as needed for temporary relief to your throat pain    You can take Tylenol  and/or Ibuprofen as needed for fever reduction and pain relief.   For cough: honey 1/2 to 1 teaspoon (you can dilute the honey in water or another fluid).  You can also use guaifenesin  and dextromethorphan for cough. You can use a humidifier for chest congestion and cough.  If you don't have a humidifier, you can sit in the bathroom with the hot shower running.      For sore throat: try warm salt water gargles, cepacol lozenges, throat spray, warm tea or water with lemon/honey, popsicles or ice, or OTC cold relief medicine for throat discomfort.   For congestion: take a daily anti-histamine like Zyrtec, Claritin, and a oral decongestant, such as pseudoephedrine.  You can also use Flonase 1-2 sprays in each nostril daily.   It is important to stay hydrated:  drink plenty of fluids (water, gatorade/powerade/pedialyte, juices, or teas) to keep your throat moisturized and help further relieve irritation/discomfort.    ED Prescriptions     Medication Sig Dispense Auth. Provider   amoxicillin -clavulanate (AUGMENTIN) 875-125 MG tablet Take  1 tablet by mouth every 12 (twelve) hours. 14 tablet Jeannett Dekoning R, NP   benzonatate  (TESSALON ) 100 MG capsule Take 1 capsule (100 mg total) by mouth every 8 (eight) hours. 21 capsule Fremon Zacharia R, NP   lidocaine  (XYLOCAINE ) 2 % solution Use as directed 15 mLs in the mouth or throat every 4 (four) hours as needed for mouth pain. 100 mL Teresa Shelba SAUNDERS, NP      PDMP not reviewed this encounter.   Teresa Shelba SAUNDERS, NP 10/22/24 1059

## 2024-11-11 DIAGNOSIS — D2272 Melanocytic nevi of left lower limb, including hip: Secondary | ICD-10-CM | POA: Diagnosis not present

## 2024-11-11 DIAGNOSIS — D225 Melanocytic nevi of trunk: Secondary | ICD-10-CM | POA: Diagnosis not present

## 2024-11-11 DIAGNOSIS — D2261 Melanocytic nevi of right upper limb, including shoulder: Secondary | ICD-10-CM | POA: Diagnosis not present

## 2024-11-11 DIAGNOSIS — D2262 Melanocytic nevi of left upper limb, including shoulder: Secondary | ICD-10-CM | POA: Diagnosis not present

## 2024-11-11 DIAGNOSIS — L538 Other specified erythematous conditions: Secondary | ICD-10-CM | POA: Diagnosis not present

## 2024-11-11 DIAGNOSIS — L820 Inflamed seborrheic keratosis: Secondary | ICD-10-CM | POA: Diagnosis not present

## 2024-11-11 DIAGNOSIS — D485 Neoplasm of uncertain behavior of skin: Secondary | ICD-10-CM | POA: Diagnosis not present

## 2024-11-11 DIAGNOSIS — C44519 Basal cell carcinoma of skin of other part of trunk: Secondary | ICD-10-CM | POA: Diagnosis not present

## 2025-09-27 ENCOUNTER — Ambulatory Visit

## 2025-09-27 ENCOUNTER — Ambulatory Visit: Admitting: Family Medicine

## 2025-09-27 ENCOUNTER — Encounter: Admitting: Family Medicine
